# Patient Record
Sex: Male | Born: 1935 | Race: White | Hispanic: No | Marital: Married | Smoking: Former smoker
Health system: Southern US, Community
[De-identification: ages and names within clinical notes are randomized; demographics above are authoritative.]

## PROBLEM LIST (undated history)

## (undated) DIAGNOSIS — I714 Abdominal aortic aneurysm, without rupture, unspecified: Secondary | ICD-10-CM

## (undated) DIAGNOSIS — J189 Pneumonia, unspecified organism: Secondary | ICD-10-CM

## (undated) DIAGNOSIS — I2510 Atherosclerotic heart disease of native coronary artery without angina pectoris: Secondary | ICD-10-CM

## (undated) DIAGNOSIS — N189 Chronic kidney disease, unspecified: Secondary | ICD-10-CM

## (undated) DIAGNOSIS — C4490 Unspecified malignant neoplasm of skin, unspecified: Secondary | ICD-10-CM

## (undated) DIAGNOSIS — J449 Chronic obstructive pulmonary disease, unspecified: Secondary | ICD-10-CM

## (undated) DIAGNOSIS — R51 Headache: Secondary | ICD-10-CM

## (undated) DIAGNOSIS — IMO0001 Reserved for inherently not codable concepts without codable children: Secondary | ICD-10-CM

## (undated) DIAGNOSIS — K219 Gastro-esophageal reflux disease without esophagitis: Secondary | ICD-10-CM

## (undated) DIAGNOSIS — I10 Essential (primary) hypertension: Secondary | ICD-10-CM

## (undated) DIAGNOSIS — E7800 Pure hypercholesterolemia, unspecified: Secondary | ICD-10-CM

## (undated) DIAGNOSIS — E119 Type 2 diabetes mellitus without complications: Secondary | ICD-10-CM

## (undated) DIAGNOSIS — C3490 Malignant neoplasm of unspecified part of unspecified bronchus or lung: Secondary | ICD-10-CM

## (undated) DIAGNOSIS — R519 Headache, unspecified: Secondary | ICD-10-CM

## (undated) HISTORY — DX: Essential (primary) hypertension: I10

## (undated) HISTORY — DX: Malignant neoplasm of unspecified part of unspecified bronchus or lung: C34.90

## (undated) HISTORY — DX: Abdominal aortic aneurysm, without rupture: I71.4

## (undated) HISTORY — PX: TONSILLECTOMY: SUR1361

## (undated) HISTORY — DX: Pure hypercholesterolemia, unspecified: E78.00

## (undated) HISTORY — DX: Atherosclerotic heart disease of native coronary artery without angina pectoris: I25.10

## (undated) HISTORY — DX: Unspecified malignant neoplasm of skin, unspecified: C44.90

## (undated) HISTORY — DX: Abdominal aortic aneurysm, without rupture, unspecified: I71.40

## (undated) HISTORY — DX: Type 2 diabetes mellitus without complications: E11.9

---

## 1998-04-28 HISTORY — PX: ABDOMINAL AORTIC ANEURYSM REPAIR: SUR1152

## 1998-04-28 HISTORY — PX: CORONARY STENT PLACEMENT: SHX1402

## 2004-01-30 ENCOUNTER — Ambulatory Visit: Payer: Self-pay | Admitting: Specialist

## 2010-10-23 ENCOUNTER — Ambulatory Visit: Payer: Self-pay | Admitting: Internal Medicine

## 2010-10-25 ENCOUNTER — Ambulatory Visit: Payer: Self-pay | Admitting: Oncology

## 2010-10-27 ENCOUNTER — Ambulatory Visit: Payer: Self-pay | Admitting: Oncology

## 2010-10-31 ENCOUNTER — Ambulatory Visit: Payer: Self-pay | Admitting: Oncology

## 2010-11-04 ENCOUNTER — Ambulatory Visit: Payer: Self-pay | Admitting: Oncology

## 2010-11-05 ENCOUNTER — Ambulatory Visit: Payer: Self-pay | Admitting: Internal Medicine

## 2010-11-08 ENCOUNTER — Ambulatory Visit: Payer: Self-pay | Admitting: Oncology

## 2010-11-27 ENCOUNTER — Ambulatory Visit: Payer: Self-pay | Admitting: Oncology

## 2010-12-28 ENCOUNTER — Ambulatory Visit: Payer: Self-pay | Admitting: Oncology

## 2010-12-31 ENCOUNTER — Ambulatory Visit: Payer: Self-pay | Admitting: Vascular Surgery

## 2011-01-01 ENCOUNTER — Ambulatory Visit: Payer: Self-pay | Admitting: Oncology

## 2011-01-27 ENCOUNTER — Ambulatory Visit: Payer: Self-pay | Admitting: Oncology

## 2011-02-27 ENCOUNTER — Ambulatory Visit: Payer: Self-pay | Admitting: Oncology

## 2011-03-29 ENCOUNTER — Ambulatory Visit: Payer: Self-pay | Admitting: Oncology

## 2011-05-01 ENCOUNTER — Ambulatory Visit: Payer: Self-pay | Admitting: Oncology

## 2011-05-08 ENCOUNTER — Ambulatory Visit: Payer: Self-pay | Admitting: Oncology

## 2011-05-29 ENCOUNTER — Ambulatory Visit: Payer: Self-pay | Admitting: Oncology

## 2011-05-30 ENCOUNTER — Ambulatory Visit: Payer: Self-pay | Admitting: Oncology

## 2011-06-02 LAB — COMPREHENSIVE METABOLIC PANEL
Albumin: 3.7 g/dL (ref 3.4–5.0)
Co2: 32 mmol/L (ref 21–32)
Creatinine: 1.59 mg/dL — ABNORMAL HIGH (ref 0.60–1.30)
EGFR (African American): 55 — ABNORMAL LOW
Glucose: 108 mg/dL — ABNORMAL HIGH (ref 65–99)
Osmolality: 289 (ref 275–301)
SGOT(AST): 18 U/L (ref 15–37)
SGPT (ALT): 22 U/L
Total Protein: 7.5 g/dL (ref 6.4–8.2)

## 2011-06-02 LAB — CBC CANCER CENTER
Eosinophil #: 0.4 x10 3/mm (ref 0.0–0.7)
Eosinophil %: 4.2 %
HCT: 42.1 % (ref 40.0–52.0)
HGB: 13.8 g/dL (ref 13.0–18.0)
Lymphocyte #: 0.8 x10 3/mm — ABNORMAL LOW (ref 1.0–3.6)
Lymphocyte %: 9.6 %
MCV: 89 fL (ref 80–100)
Monocyte #: 0.6 x10 3/mm (ref 0.0–0.7)
Monocyte %: 7.6 %
RBC: 4.71 10*6/uL (ref 4.40–5.90)
WBC: 8.3 x10 3/mm (ref 3.8–10.6)

## 2011-06-27 ENCOUNTER — Ambulatory Visit: Payer: Self-pay | Admitting: Oncology

## 2011-07-30 ENCOUNTER — Ambulatory Visit: Payer: Self-pay | Admitting: Oncology

## 2011-08-27 ENCOUNTER — Ambulatory Visit: Payer: Self-pay | Admitting: Oncology

## 2011-09-27 ENCOUNTER — Ambulatory Visit: Payer: Self-pay | Admitting: Oncology

## 2011-09-30 LAB — COMPREHENSIVE METABOLIC PANEL
Albumin: 3.6 g/dL (ref 3.4–5.0)
Alkaline Phosphatase: 77 U/L (ref 50–136)
Anion Gap: 5 — ABNORMAL LOW (ref 7–16)
Bilirubin,Total: 0.4 mg/dL (ref 0.2–1.0)
Calcium, Total: 8.8 mg/dL (ref 8.5–10.1)
Chloride: 105 mmol/L (ref 98–107)
Creatinine: 1.67 mg/dL — ABNORMAL HIGH (ref 0.60–1.30)
EGFR (African American): 45 — ABNORMAL LOW
EGFR (Non-African Amer.): 39 — ABNORMAL LOW
Glucose: 140 mg/dL — ABNORMAL HIGH (ref 65–99)
Osmolality: 291 (ref 275–301)
SGOT(AST): 15 U/L (ref 15–37)
SGPT (ALT): 24 U/L
Total Protein: 7.1 g/dL (ref 6.4–8.2)

## 2011-09-30 LAB — CBC CANCER CENTER
Basophil %: 0.8 %
Eosinophil %: 3.7 %
HCT: 41.1 % (ref 40.0–52.0)
HGB: 12.9 g/dL — ABNORMAL LOW (ref 13.0–18.0)
MCHC: 31.4 g/dL — ABNORMAL LOW (ref 32.0–36.0)
MCV: 91 fL (ref 80–100)
Monocyte %: 9 %
Neutrophil #: 5.7 x10 3/mm (ref 1.4–6.5)
Neutrophil %: 76.8 %
RDW: 14.5 % (ref 11.5–14.5)
WBC: 7.5 x10 3/mm (ref 3.8–10.6)

## 2011-10-27 ENCOUNTER — Ambulatory Visit: Payer: Self-pay | Admitting: Oncology

## 2011-11-03 LAB — CBC CANCER CENTER
Basophil #: 0 x10 3/mm (ref 0.0–0.1)
Basophil %: 0.6 %
Eosinophil #: 0.3 x10 3/mm (ref 0.0–0.7)
Eosinophil %: 4.3 %
HCT: 42.6 % (ref 40.0–52.0)
HGB: 13.6 g/dL (ref 13.0–18.0)
Lymphocyte %: 9.8 %
MCH: 28.9 pg (ref 26.0–34.0)
MCHC: 31.8 g/dL — ABNORMAL LOW (ref 32.0–36.0)
Monocyte #: 0.6 x10 3/mm (ref 0.2–1.0)
Neutrophil %: 77.6 %
Platelet: 164 x10 3/mm (ref 150–440)
RBC: 4.7 10*6/uL (ref 4.40–5.90)
RDW: 14.5 % (ref 11.5–14.5)
WBC: 7.7 x10 3/mm (ref 3.8–10.6)

## 2011-11-03 LAB — COMPREHENSIVE METABOLIC PANEL
Alkaline Phosphatase: 90 U/L (ref 50–136)
Calcium, Total: 9.7 mg/dL (ref 8.5–10.1)
Chloride: 103 mmol/L (ref 98–107)
Co2: 30 mmol/L (ref 21–32)
Creatinine: 1.46 mg/dL — ABNORMAL HIGH (ref 0.60–1.30)
EGFR (African American): 53 — ABNORMAL LOW
EGFR (Non-African Amer.): 45 — ABNORMAL LOW
Glucose: 136 mg/dL — ABNORMAL HIGH (ref 65–99)
Osmolality: 288 (ref 275–301)
Potassium: 4.4 mmol/L (ref 3.5–5.1)
SGOT(AST): 28 U/L (ref 15–37)
Sodium: 141 mmol/L (ref 136–145)
Total Protein: 7.2 g/dL (ref 6.4–8.2)

## 2011-11-24 ENCOUNTER — Ambulatory Visit: Payer: Self-pay | Admitting: Oncology

## 2011-11-27 ENCOUNTER — Ambulatory Visit: Payer: Self-pay | Admitting: Oncology

## 2011-12-03 ENCOUNTER — Ambulatory Visit: Payer: Self-pay | Admitting: Internal Medicine

## 2011-12-08 ENCOUNTER — Ambulatory Visit: Payer: Self-pay | Admitting: Oncology

## 2011-12-28 ENCOUNTER — Ambulatory Visit: Payer: Self-pay | Admitting: Oncology

## 2012-01-05 LAB — CBC CANCER CENTER
Basophil #: 0.1 x10 3/mm (ref 0.0–0.1)
Basophil %: 0.6 %
Eosinophil #: 0.1 x10 3/mm (ref 0.0–0.7)
Eosinophil %: 0.6 %
MCH: 28.5 pg (ref 26.0–34.0)
MCHC: 31.1 g/dL — ABNORMAL LOW (ref 32.0–36.0)
Monocyte #: 0.4 x10 3/mm (ref 0.2–1.0)
Neutrophil #: 10.6 x10 3/mm — ABNORMAL HIGH (ref 1.4–6.5)
Neutrophil %: 90.7 %
Platelet: 133 x10 3/mm — ABNORMAL LOW (ref 150–440)
RBC: 4.54 10*6/uL (ref 4.40–5.90)
RDW: 15.5 % — ABNORMAL HIGH (ref 11.5–14.5)
WBC: 11.7 x10 3/mm — ABNORMAL HIGH (ref 3.8–10.6)

## 2012-01-05 LAB — COMPREHENSIVE METABOLIC PANEL
Albumin: 3.4 g/dL (ref 3.4–5.0)
Alkaline Phosphatase: 67 U/L (ref 50–136)
BUN: 38 mg/dL — ABNORMAL HIGH (ref 7–18)
Bilirubin,Total: 0.5 mg/dL (ref 0.2–1.0)
Chloride: 100 mmol/L (ref 98–107)
Co2: 31 mmol/L (ref 21–32)
Creatinine: 1.87 mg/dL — ABNORMAL HIGH (ref 0.60–1.30)
EGFR (African American): 39 — ABNORMAL LOW
EGFR (Non-African Amer.): 34 — ABNORMAL LOW
Osmolality: 289 (ref 275–301)
SGOT(AST): 16 U/L (ref 15–37)
SGPT (ALT): 41 U/L (ref 12–78)
Sodium: 138 mmol/L (ref 136–145)
Total Protein: 6.8 g/dL (ref 6.4–8.2)

## 2012-01-21 LAB — COMPREHENSIVE METABOLIC PANEL
Anion Gap: 9 (ref 7–16)
BUN: 29 mg/dL — ABNORMAL HIGH (ref 7–18)
Chloride: 100 mmol/L (ref 98–107)
Creatinine: 1.78 mg/dL — ABNORMAL HIGH (ref 0.60–1.30)
EGFR (African American): 41 — ABNORMAL LOW
EGFR (Non-African Amer.): 36 — ABNORMAL LOW
Glucose: 172 mg/dL — ABNORMAL HIGH (ref 65–99)
Osmolality: 282 (ref 275–301)
SGOT(AST): 29 U/L (ref 15–37)
SGPT (ALT): 41 U/L (ref 12–78)
Total Protein: 6.8 g/dL (ref 6.4–8.2)

## 2012-01-21 LAB — CBC CANCER CENTER
Basophil #: 0.1 x10 3/mm (ref 0.0–0.1)
Basophil %: 1.1 %
Eosinophil #: 0.1 x10 3/mm (ref 0.0–0.7)
Eosinophil %: 1.9 %
HCT: 35.2 % — ABNORMAL LOW (ref 40.0–52.0)
HGB: 11.1 g/dL — ABNORMAL LOW (ref 13.0–18.0)
Lymphocyte %: 6 %
MCHC: 31.4 g/dL — ABNORMAL LOW (ref 32.0–36.0)
MCV: 91 fL (ref 80–100)
Monocyte #: 0.9 x10 3/mm (ref 0.2–1.0)
Monocyte %: 11.9 %
Neutrophil #: 6 x10 3/mm (ref 1.4–6.5)
Neutrophil %: 79.1 %
RBC: 3.86 10*6/uL — ABNORMAL LOW (ref 4.40–5.90)

## 2012-01-23 LAB — CREATININE, SERUM
Creatinine: 1.82 mg/dL — ABNORMAL HIGH (ref 0.60–1.30)
EGFR (African American): 40 — ABNORMAL LOW
EGFR (Non-African Amer.): 35 — ABNORMAL LOW

## 2012-01-26 LAB — CBC CANCER CENTER
Basophil #: 0 x10 3/mm (ref 0.0–0.1)
Eosinophil #: 0.2 x10 3/mm (ref 0.0–0.7)
Eosinophil %: 3 %
HCT: 32 % — ABNORMAL LOW (ref 40.0–52.0)
MCH: 28.5 pg (ref 26.0–34.0)
MCV: 90 fL (ref 80–100)
Monocyte #: 0.7 x10 3/mm (ref 0.2–1.0)
Neutrophil #: 5.6 x10 3/mm (ref 1.4–6.5)
Neutrophil %: 80.6 %
Platelet: 253 x10 3/mm (ref 150–440)
RDW: 15.3 % — ABNORMAL HIGH (ref 11.5–14.5)

## 2012-01-26 LAB — COMPREHENSIVE METABOLIC PANEL
Albumin: 2.6 g/dL — ABNORMAL LOW (ref 3.4–5.0)
Alkaline Phosphatase: 55 U/L (ref 50–136)
BUN: 23 mg/dL — ABNORMAL HIGH (ref 7–18)
Bilirubin,Total: 0.4 mg/dL (ref 0.2–1.0)
Chloride: 99 mmol/L (ref 98–107)
Co2: 26 mmol/L (ref 21–32)
Creatinine: 1.56 mg/dL — ABNORMAL HIGH (ref 0.60–1.30)
EGFR (African American): 49 — ABNORMAL LOW
Osmolality: 284 (ref 275–301)
SGPT (ALT): 31 U/L (ref 12–78)
Sodium: 139 mmol/L (ref 136–145)
Total Protein: 7.1 g/dL (ref 6.4–8.2)

## 2012-01-27 ENCOUNTER — Ambulatory Visit: Payer: Self-pay | Admitting: Oncology

## 2012-02-02 ENCOUNTER — Ambulatory Visit: Payer: Self-pay | Admitting: Internal Medicine

## 2012-02-04 LAB — CBC CANCER CENTER
Basophil %: 0.5 %
Eosinophil #: 0.1 x10 3/mm (ref 0.0–0.7)
HCT: 35.9 % — ABNORMAL LOW (ref 40.0–52.0)
Lymphocyte #: 0.5 x10 3/mm — ABNORMAL LOW (ref 1.0–3.6)
Lymphocyte %: 2.6 %
Monocyte #: 0.4 x10 3/mm (ref 0.2–1.0)
Platelet: 229 x10 3/mm (ref 150–440)
RBC: 3.92 10*6/uL — ABNORMAL LOW (ref 4.40–5.90)
RDW: 15.8 % — ABNORMAL HIGH (ref 11.5–14.5)
WBC: 17.9 x10 3/mm — ABNORMAL HIGH (ref 3.8–10.6)

## 2012-02-16 LAB — CBC CANCER CENTER
Basophil #: 0 x10 3/mm (ref 0.0–0.1)
Basophil %: 0.3 %
Eosinophil #: 0.2 x10 3/mm (ref 0.0–0.7)
Lymphocyte %: 4.1 %
Monocyte %: 5.2 %
Neutrophil #: 9.6 x10 3/mm — ABNORMAL HIGH (ref 1.4–6.5)
Platelet: 110 x10 3/mm — ABNORMAL LOW (ref 150–440)
RDW: 17 % — ABNORMAL HIGH (ref 11.5–14.5)
WBC: 10.9 x10 3/mm — ABNORMAL HIGH (ref 3.8–10.6)

## 2012-02-16 LAB — COMPREHENSIVE METABOLIC PANEL
Albumin: 2.9 g/dL — ABNORMAL LOW (ref 3.4–5.0)
Alkaline Phosphatase: 66 U/L (ref 50–136)
Anion Gap: 12 (ref 7–16)
BUN: 27 mg/dL — ABNORMAL HIGH (ref 7–18)
Chloride: 100 mmol/L (ref 98–107)
Creatinine: 1.52 mg/dL — ABNORMAL HIGH (ref 0.60–1.30)
EGFR (Non-African Amer.): 43 — ABNORMAL LOW
Glucose: 181 mg/dL — ABNORMAL HIGH (ref 65–99)
Osmolality: 289 (ref 275–301)
Potassium: 3.7 mmol/L (ref 3.5–5.1)
SGOT(AST): 12 U/L — ABNORMAL LOW (ref 15–37)
Total Protein: 6.1 g/dL — ABNORMAL LOW (ref 6.4–8.2)

## 2012-02-23 LAB — CBC CANCER CENTER
Basophil #: 0.1 x10 3/mm (ref 0.0–0.1)
Basophil %: 1 %
Eosinophil #: 0.1 x10 3/mm (ref 0.0–0.7)
HCT: 33.9 % — ABNORMAL LOW (ref 40.0–52.0)
HGB: 10.5 g/dL — ABNORMAL LOW (ref 13.0–18.0)
Lymphocyte %: 4.4 %
MCH: 29.1 pg (ref 26.0–34.0)
MCHC: 31.1 g/dL — ABNORMAL LOW (ref 32.0–36.0)
Monocyte #: 0.2 x10 3/mm (ref 0.2–1.0)
Monocyte %: 4 %
Neutrophil #: 5.4 x10 3/mm (ref 1.4–6.5)
Platelet: 111 x10 3/mm — ABNORMAL LOW (ref 150–440)
RBC: 3.62 10*6/uL — ABNORMAL LOW (ref 4.40–5.90)
RDW: 17.4 % — ABNORMAL HIGH (ref 11.5–14.5)

## 2012-02-23 LAB — COMPREHENSIVE METABOLIC PANEL
Anion Gap: 11 (ref 7–16)
BUN: 32 mg/dL — ABNORMAL HIGH (ref 7–18)
Bilirubin,Total: 0.3 mg/dL (ref 0.2–1.0)
Calcium, Total: 8.9 mg/dL (ref 8.5–10.1)
Chloride: 107 mmol/L (ref 98–107)
Co2: 26 mmol/L (ref 21–32)
Creatinine: 1.32 mg/dL — ABNORMAL HIGH (ref 0.60–1.30)
EGFR (African American): 59 — ABNORMAL LOW
SGOT(AST): 13 U/L — ABNORMAL LOW (ref 15–37)
SGPT (ALT): 35 U/L (ref 12–78)

## 2012-02-27 ENCOUNTER — Ambulatory Visit: Payer: Self-pay | Admitting: Oncology

## 2012-03-01 LAB — COMPREHENSIVE METABOLIC PANEL
Albumin: 2.8 g/dL — ABNORMAL LOW (ref 3.4–5.0)
Alkaline Phosphatase: 68 U/L (ref 50–136)
BUN: 25 mg/dL — ABNORMAL HIGH (ref 7–18)
Calcium, Total: 8.6 mg/dL (ref 8.5–10.1)
EGFR (Non-African Amer.): 35 — ABNORMAL LOW
Glucose: 169 mg/dL — ABNORMAL HIGH (ref 65–99)
Osmolality: 284 (ref 275–301)
SGOT(AST): 20 U/L (ref 15–37)
SGPT (ALT): 49 U/L (ref 12–78)
Sodium: 138 mmol/L (ref 136–145)
Total Protein: 6.1 g/dL — ABNORMAL LOW (ref 6.4–8.2)

## 2012-03-01 LAB — CBC CANCER CENTER
Basophil #: 0 x10 3/mm (ref 0.0–0.1)
Basophil %: 1.1 %
Eosinophil #: 0 x10 3/mm (ref 0.0–0.7)
HGB: 10.7 g/dL — ABNORMAL LOW (ref 13.0–18.0)
Lymphocyte %: 5.8 %
MCHC: 31.9 g/dL — ABNORMAL LOW (ref 32.0–36.0)
Monocyte #: 0.3 x10 3/mm (ref 0.2–1.0)
Monocyte %: 9 %
Neutrophil %: 83 %
Platelet: 125 x10 3/mm — ABNORMAL LOW (ref 150–440)

## 2012-03-15 LAB — CBC CANCER CENTER
Basophil #: 0 x10 3/mm (ref 0.0–0.1)
Basophil %: 0.7 %
Eosinophil %: 1.6 %
HCT: 31.7 % — ABNORMAL LOW (ref 40.0–52.0)
Lymphocyte #: 0.3 x10 3/mm — ABNORMAL LOW (ref 1.0–3.6)
Lymphocyte %: 4.6 %
MCH: 29.2 pg (ref 26.0–34.0)
MCHC: 31.5 g/dL — ABNORMAL LOW (ref 32.0–36.0)
MCV: 93 fL (ref 80–100)
Monocyte #: 1.1 x10 3/mm — ABNORMAL HIGH (ref 0.2–1.0)
Monocyte %: 17.9 %
Platelet: 237 x10 3/mm (ref 150–440)
RBC: 3.43 10*6/uL — ABNORMAL LOW (ref 4.40–5.90)
RDW: 17.1 % — ABNORMAL HIGH (ref 11.5–14.5)
WBC: 6.2 x10 3/mm (ref 3.8–10.6)

## 2012-03-15 LAB — COMPREHENSIVE METABOLIC PANEL
Albumin: 2.7 g/dL — ABNORMAL LOW (ref 3.4–5.0)
Alkaline Phosphatase: 71 U/L (ref 50–136)
Anion Gap: 9 (ref 7–16)
BUN: 20 mg/dL — ABNORMAL HIGH (ref 7–18)
Bilirubin,Total: 0.6 mg/dL (ref 0.2–1.0)
EGFR (African American): 49 — ABNORMAL LOW
EGFR (Non-African Amer.): 42 — ABNORMAL LOW
Glucose: 214 mg/dL — ABNORMAL HIGH (ref 65–99)
Osmolality: 288 (ref 275–301)
SGOT(AST): 21 U/L (ref 15–37)
SGPT (ALT): 37 U/L (ref 12–78)
Sodium: 140 mmol/L (ref 136–145)

## 2012-03-28 ENCOUNTER — Ambulatory Visit: Payer: Self-pay | Admitting: Oncology

## 2012-04-12 LAB — COMPREHENSIVE METABOLIC PANEL
Anion Gap: 9 (ref 7–16)
BUN: 27 mg/dL — ABNORMAL HIGH (ref 7–18)
Creatinine: 1.55 mg/dL — ABNORMAL HIGH (ref 0.60–1.30)
EGFR (African American): 49 — ABNORMAL LOW
EGFR (Non-African Amer.): 42 — ABNORMAL LOW
Glucose: 201 mg/dL — ABNORMAL HIGH (ref 65–99)
SGOT(AST): 17 U/L (ref 15–37)
SGPT (ALT): 34 U/L (ref 12–78)
Sodium: 142 mmol/L (ref 136–145)
Total Protein: 6.1 g/dL — ABNORMAL LOW (ref 6.4–8.2)

## 2012-04-12 LAB — CBC CANCER CENTER
Basophil #: 0 x10 3/mm (ref 0.0–0.1)
Eosinophil %: 1.7 %
HCT: 35.2 % — ABNORMAL LOW (ref 40.0–52.0)
Lymphocyte #: 0.4 x10 3/mm — ABNORMAL LOW (ref 1.0–3.6)
MCH: 31.5 pg (ref 26.0–34.0)
MCV: 96 fL (ref 80–100)
Monocyte #: 0.5 x10 3/mm (ref 0.2–1.0)
Neutrophil #: 6.1 x10 3/mm (ref 1.4–6.5)
Neutrophil %: 85.1 %
Platelet: 111 x10 3/mm — ABNORMAL LOW (ref 150–440)
RBC: 3.67 10*6/uL — ABNORMAL LOW (ref 4.40–5.90)
WBC: 7.2 x10 3/mm (ref 3.8–10.6)

## 2012-04-28 ENCOUNTER — Ambulatory Visit: Payer: Self-pay | Admitting: Oncology

## 2012-05-24 ENCOUNTER — Ambulatory Visit: Payer: Self-pay | Admitting: Oncology

## 2012-05-29 ENCOUNTER — Ambulatory Visit: Payer: Self-pay | Admitting: Oncology

## 2012-06-15 ENCOUNTER — Encounter: Payer: Self-pay | Admitting: Oncology

## 2012-06-26 ENCOUNTER — Ambulatory Visit: Payer: Self-pay | Admitting: Oncology

## 2012-06-26 ENCOUNTER — Encounter: Payer: Self-pay | Admitting: Oncology

## 2012-07-22 LAB — CBC CANCER CENTER
Basophil #: 0.1 x10 3/mm (ref 0.0–0.1)
Basophil %: 1.2 %
HCT: 38.8 % — ABNORMAL LOW (ref 40.0–52.0)
Lymphocyte #: 0.4 x10 3/mm — ABNORMAL LOW (ref 1.0–3.6)
MCH: 28.4 pg (ref 26.0–34.0)
MCHC: 32.2 g/dL (ref 32.0–36.0)
MCV: 88 fL (ref 80–100)
RBC: 4.39 10*6/uL — ABNORMAL LOW (ref 4.40–5.90)

## 2012-07-22 LAB — COMPREHENSIVE METABOLIC PANEL
Albumin: 3.2 g/dL — ABNORMAL LOW (ref 3.4–5.0)
Alkaline Phosphatase: 89 U/L (ref 50–136)
BUN: 24 mg/dL — ABNORMAL HIGH (ref 7–18)
Chloride: 102 mmol/L (ref 98–107)
Creatinine: 1.73 mg/dL — ABNORMAL HIGH (ref 0.60–1.30)
EGFR (Non-African Amer.): 37 — ABNORMAL LOW
Glucose: 214 mg/dL — ABNORMAL HIGH (ref 65–99)
Potassium: 3.7 mmol/L (ref 3.5–5.1)
SGOT(AST): 14 U/L — ABNORMAL LOW (ref 15–37)
SGPT (ALT): 21 U/L (ref 12–78)
Total Protein: 6.6 g/dL (ref 6.4–8.2)

## 2012-07-27 ENCOUNTER — Ambulatory Visit: Payer: Self-pay | Admitting: Oncology

## 2012-07-27 ENCOUNTER — Encounter: Payer: Self-pay | Admitting: Oncology

## 2012-08-19 ENCOUNTER — Ambulatory Visit: Payer: Self-pay | Admitting: Oncology

## 2012-08-26 ENCOUNTER — Ambulatory Visit: Payer: Self-pay | Admitting: Oncology

## 2012-09-22 LAB — CBC CANCER CENTER
Basophil #: 0.1 x10 3/mm (ref 0.0–0.1)
Basophil %: 1.2 %
Eosinophil #: 0.3 x10 3/mm (ref 0.0–0.7)
Eosinophil %: 3.6 %
HCT: 36.1 % — ABNORMAL LOW (ref 40.0–52.0)
HGB: 11.6 g/dL — ABNORMAL LOW (ref 13.0–18.0)
Lymphocyte #: 0.4 x10 3/mm — ABNORMAL LOW (ref 1.0–3.6)
Lymphocyte %: 5.2 %
MCH: 27.8 pg (ref 26.0–34.0)
MCHC: 32.1 g/dL (ref 32.0–36.0)
Monocyte #: 0.6 x10 3/mm (ref 0.2–1.0)
Monocyte %: 8 %
Platelet: 193 x10 3/mm (ref 150–440)
RBC: 4.17 10*6/uL — ABNORMAL LOW (ref 4.40–5.90)
RDW: 15.3 % — ABNORMAL HIGH (ref 11.5–14.5)
WBC: 7.4 x10 3/mm (ref 3.8–10.6)

## 2012-09-22 LAB — COMPREHENSIVE METABOLIC PANEL
Albumin: 3 g/dL — ABNORMAL LOW (ref 3.4–5.0)
Alkaline Phosphatase: 83 U/L (ref 50–136)
BUN: 22 mg/dL — ABNORMAL HIGH (ref 7–18)
Bilirubin,Total: 0.4 mg/dL (ref 0.2–1.0)
EGFR (African American): 49 — ABNORMAL LOW
Osmolality: 287 (ref 275–301)
SGOT(AST): 14 U/L — ABNORMAL LOW (ref 15–37)
Total Protein: 6.3 g/dL — ABNORMAL LOW (ref 6.4–8.2)

## 2012-09-26 ENCOUNTER — Ambulatory Visit: Payer: Self-pay | Admitting: Oncology

## 2012-10-26 ENCOUNTER — Ambulatory Visit: Payer: Self-pay | Admitting: Oncology

## 2012-11-01 LAB — COMPREHENSIVE METABOLIC PANEL
Albumin: 3 g/dL — ABNORMAL LOW (ref 3.4–5.0)
Alkaline Phosphatase: 69 U/L (ref 50–136)
BUN: 23 mg/dL — ABNORMAL HIGH (ref 7–18)
Bilirubin,Total: 0.7 mg/dL (ref 0.2–1.0)
Calcium, Total: 9 mg/dL (ref 8.5–10.1)
Chloride: 105 mmol/L (ref 98–107)
Co2: 31 mmol/L (ref 21–32)
Creatinine: 1.84 mg/dL — ABNORMAL HIGH (ref 0.60–1.30)
EGFR (Non-African Amer.): 34 — ABNORMAL LOW
Glucose: 201 mg/dL — ABNORMAL HIGH (ref 65–99)
Osmolality: 294 (ref 275–301)
SGPT (ALT): 30 U/L (ref 12–78)
Sodium: 143 mmol/L (ref 136–145)
Total Protein: 6.4 g/dL (ref 6.4–8.2)

## 2012-11-01 LAB — CBC CANCER CENTER
Basophil #: 0.1 x10 3/mm (ref 0.0–0.1)
Basophil %: 1.1 %
Eosinophil #: 0.3 x10 3/mm (ref 0.0–0.7)
HGB: 12.3 g/dL — ABNORMAL LOW (ref 13.0–18.0)
Lymphocyte #: 0.5 x10 3/mm — ABNORMAL LOW (ref 1.0–3.6)
MCHC: 32.7 g/dL (ref 32.0–36.0)
MCV: 88 fL (ref 80–100)
Monocyte #: 0.7 x10 3/mm (ref 0.2–1.0)
Neutrophil #: 8 x10 3/mm — ABNORMAL HIGH (ref 1.4–6.5)
Neutrophil %: 83.3 %
Platelet: 169 x10 3/mm (ref 150–440)
RBC: 4.26 10*6/uL — ABNORMAL LOW (ref 4.40–5.90)
RDW: 16 % — ABNORMAL HIGH (ref 11.5–14.5)
WBC: 9.6 x10 3/mm (ref 3.8–10.6)

## 2012-11-22 LAB — CBC CANCER CENTER
Basophil #: 0.1 x10 3/mm (ref 0.0–0.1)
Basophil %: 0.5 %
Eosinophil %: 1.9 %
HGB: 13.2 g/dL (ref 13.0–18.0)
Lymphocyte %: 3.9 %
MCH: 29.1 pg (ref 26.0–34.0)
MCHC: 32.6 g/dL (ref 32.0–36.0)
Monocyte #: 0.8 x10 3/mm (ref 0.2–1.0)
Neutrophil %: 87.4 %
Platelet: 135 x10 3/mm — ABNORMAL LOW (ref 150–440)
RBC: 4.53 10*6/uL (ref 4.40–5.90)
RDW: 16.3 % — ABNORMAL HIGH (ref 11.5–14.5)
WBC: 12.6 x10 3/mm — ABNORMAL HIGH (ref 3.8–10.6)

## 2012-11-22 LAB — COMPREHENSIVE METABOLIC PANEL
Albumin: 3.1 g/dL — ABNORMAL LOW (ref 3.4–5.0)
BUN: 28 mg/dL — ABNORMAL HIGH (ref 7–18)
Bilirubin,Total: 1 mg/dL (ref 0.2–1.0)
Calcium, Total: 9 mg/dL (ref 8.5–10.1)
Co2: 32 mmol/L (ref 21–32)
Creatinine: 1.61 mg/dL — ABNORMAL HIGH (ref 0.60–1.30)
EGFR (African American): 46 — ABNORMAL LOW
Glucose: 128 mg/dL — ABNORMAL HIGH (ref 65–99)
Potassium: 4.2 mmol/L (ref 3.5–5.1)
SGOT(AST): 21 U/L (ref 15–37)
SGPT (ALT): 39 U/L (ref 12–78)
Sodium: 143 mmol/L (ref 136–145)
Total Protein: 6.2 g/dL — ABNORMAL LOW (ref 6.4–8.2)

## 2012-11-26 ENCOUNTER — Ambulatory Visit: Payer: Self-pay | Admitting: Oncology

## 2012-12-20 ENCOUNTER — Ambulatory Visit: Payer: Self-pay | Admitting: Oncology

## 2012-12-22 ENCOUNTER — Ambulatory Visit: Payer: Self-pay | Admitting: Oncology

## 2012-12-22 LAB — CBC CANCER CENTER
Basophil #: 0.1 x10 3/mm (ref 0.0–0.1)
Basophil %: 0.5 %
Eosinophil %: 0.9 %
HCT: 40.4 % (ref 40.0–52.0)
HGB: 13.2 g/dL (ref 13.0–18.0)
Lymphocyte #: 0.3 x10 3/mm — ABNORMAL LOW (ref 1.0–3.6)
MCHC: 32.8 g/dL (ref 32.0–36.0)
MCV: 90 fL (ref 80–100)
Monocyte #: 0.6 x10 3/mm (ref 0.2–1.0)
Monocyte %: 5.3 %
Platelet: 140 x10 3/mm — ABNORMAL LOW (ref 150–440)
RBC: 4.51 10*6/uL (ref 4.40–5.90)

## 2012-12-22 LAB — MAGNESIUM: Magnesium: 1.5 mg/dL — ABNORMAL LOW

## 2012-12-22 LAB — COMPREHENSIVE METABOLIC PANEL
Anion Gap: 5 — ABNORMAL LOW (ref 7–16)
BUN: 39 mg/dL — ABNORMAL HIGH (ref 7–18)
Bilirubin,Total: 1 mg/dL (ref 0.2–1.0)
Calcium, Total: 9.1 mg/dL (ref 8.5–10.1)
Chloride: 106 mmol/L (ref 98–107)
Creatinine: 1.81 mg/dL — ABNORMAL HIGH (ref 0.60–1.30)
Glucose: 167 mg/dL — ABNORMAL HIGH (ref 65–99)
Osmolality: 293 (ref 275–301)
Potassium: 3.7 mmol/L (ref 3.5–5.1)
SGOT(AST): 26 U/L (ref 15–37)
Sodium: 140 mmol/L (ref 136–145)

## 2012-12-27 ENCOUNTER — Ambulatory Visit: Payer: Self-pay | Admitting: Oncology

## 2013-01-19 LAB — COMPREHENSIVE METABOLIC PANEL
Albumin: 3.1 g/dL — ABNORMAL LOW (ref 3.4–5.0)
Alkaline Phosphatase: 65 U/L (ref 50–136)
Anion Gap: 7 (ref 7–16)
BUN: 28 mg/dL — ABNORMAL HIGH (ref 7–18)
Bilirubin,Total: 0.7 mg/dL (ref 0.2–1.0)
Calcium, Total: 9.1 mg/dL (ref 8.5–10.1)
Chloride: 107 mmol/L (ref 98–107)
Co2: 30 mmol/L (ref 21–32)
Creatinine: 1.55 mg/dL — ABNORMAL HIGH (ref 0.60–1.30)
EGFR (Non-African Amer.): 42 — ABNORMAL LOW
Glucose: 147 mg/dL — ABNORMAL HIGH (ref 65–99)
Osmolality: 295 (ref 275–301)
Potassium: 3.6 mmol/L (ref 3.5–5.1)
SGOT(AST): 18 U/L (ref 15–37)
SGPT (ALT): 33 U/L (ref 12–78)
Sodium: 144 mmol/L (ref 136–145)
Total Protein: 6.1 g/dL — ABNORMAL LOW (ref 6.4–8.2)

## 2013-01-19 LAB — CBC CANCER CENTER
Basophil #: 0.1 x10 3/mm (ref 0.0–0.1)
HGB: 13.5 g/dL (ref 13.0–18.0)
Lymphocyte #: 0.5 x10 3/mm — ABNORMAL LOW (ref 1.0–3.6)
Lymphocyte %: 3.7 %
MCH: 29.3 pg (ref 26.0–34.0)
MCHC: 31.8 g/dL — ABNORMAL LOW (ref 32.0–36.0)
Monocyte %: 7.7 %
Neutrophil %: 86.8 %
Platelet: 143 x10 3/mm — ABNORMAL LOW (ref 150–440)
WBC: 12.2 x10 3/mm — ABNORMAL HIGH (ref 3.8–10.6)

## 2013-01-25 DIAGNOSIS — C44621 Squamous cell carcinoma of skin of unspecified upper limb, including shoulder: Secondary | ICD-10-CM | POA: Insufficient documentation

## 2013-01-25 DIAGNOSIS — C44310 Basal cell carcinoma of skin of unspecified parts of face: Secondary | ICD-10-CM | POA: Insufficient documentation

## 2013-01-26 ENCOUNTER — Ambulatory Visit: Payer: Self-pay | Admitting: Oncology

## 2013-02-16 LAB — COMPREHENSIVE METABOLIC PANEL
Albumin: 3.1 g/dL — ABNORMAL LOW (ref 3.4–5.0)
Alkaline Phosphatase: 54 U/L (ref 50–136)
Anion Gap: 6 — ABNORMAL LOW (ref 7–16)
BUN: 37 mg/dL — ABNORMAL HIGH (ref 7–18)
Chloride: 108 mmol/L — ABNORMAL HIGH (ref 98–107)
Creatinine: 1.75 mg/dL — ABNORMAL HIGH (ref 0.60–1.30)
EGFR (Non-African Amer.): 36 — ABNORMAL LOW
Glucose: 127 mg/dL — ABNORMAL HIGH (ref 65–99)
Osmolality: 295 (ref 275–301)
Potassium: 3.9 mmol/L (ref 3.5–5.1)
SGOT(AST): 20 U/L (ref 15–37)
SGPT (ALT): 47 U/L (ref 12–78)

## 2013-02-16 LAB — CBC CANCER CENTER
Basophil #: 0 x10 3/mm (ref 0.0–0.1)
Eosinophil #: 0.2 x10 3/mm (ref 0.0–0.7)
Lymphocyte #: 0.5 x10 3/mm — ABNORMAL LOW (ref 1.0–3.6)
Lymphocyte %: 2.9 %
MCHC: 32 g/dL (ref 32.0–36.0)
MCV: 93 fL (ref 80–100)
Neutrophil #: 14.9 x10 3/mm — ABNORMAL HIGH (ref 1.4–6.5)
RBC: 4.85 10*6/uL (ref 4.40–5.90)
RDW: 15.1 % — ABNORMAL HIGH (ref 11.5–14.5)

## 2013-02-26 ENCOUNTER — Ambulatory Visit: Payer: Self-pay | Admitting: Oncology

## 2013-03-28 ENCOUNTER — Ambulatory Visit: Payer: Self-pay | Admitting: Oncology

## 2013-03-28 LAB — COMPREHENSIVE METABOLIC PANEL
Albumin: 3.2 g/dL — ABNORMAL LOW (ref 3.4–5.0)
BUN: 23 mg/dL — ABNORMAL HIGH (ref 7–18)
Bilirubin,Total: 0.8 mg/dL (ref 0.2–1.0)
Calcium, Total: 9.6 mg/dL (ref 8.5–10.1)
Chloride: 107 mmol/L (ref 98–107)
Co2: 33 mmol/L — ABNORMAL HIGH (ref 21–32)
Creatinine: 1.65 mg/dL — ABNORMAL HIGH (ref 0.60–1.30)
EGFR (Non-African Amer.): 39 — ABNORMAL LOW
Glucose: 161 mg/dL — ABNORMAL HIGH (ref 65–99)
Osmolality: 296 (ref 275–301)
Potassium: 3.7 mmol/L (ref 3.5–5.1)
SGOT(AST): 18 U/L (ref 15–37)
SGPT (ALT): 29 U/L (ref 12–78)
Sodium: 145 mmol/L (ref 136–145)

## 2013-03-28 LAB — CBC CANCER CENTER
Basophil #: 0 x10 3/mm (ref 0.0–0.1)
Eosinophil #: 0.2 x10 3/mm (ref 0.0–0.7)
HCT: 45 % (ref 40.0–52.0)
Lymphocyte #: 0.3 x10 3/mm — ABNORMAL LOW (ref 1.0–3.6)
MCH: 29.4 pg (ref 26.0–34.0)
Monocyte %: 7.2 %
Neutrophil %: 88.6 %
Platelet: 148 x10 3/mm — ABNORMAL LOW (ref 150–440)
RBC: 4.82 10*6/uL (ref 4.40–5.90)
RDW: 14.3 % (ref 11.5–14.5)
WBC: 12.8 x10 3/mm — ABNORMAL HIGH (ref 3.8–10.6)

## 2013-04-26 ENCOUNTER — Ambulatory Visit: Payer: Self-pay | Admitting: Oncology

## 2013-04-28 ENCOUNTER — Ambulatory Visit: Payer: Self-pay | Admitting: Oncology

## 2013-05-02 LAB — CBC CANCER CENTER
BASOS PCT: 0.1 %
Basophil #: 0 x10 3/mm (ref 0.0–0.1)
EOS PCT: 0.2 %
Eosinophil #: 0 x10 3/mm (ref 0.0–0.7)
HCT: 43.9 % (ref 40.0–52.0)
HGB: 13.5 g/dL (ref 13.0–18.0)
LYMPHS PCT: 1.9 %
Lymphocyte #: 0.3 x10 3/mm — ABNORMAL LOW (ref 1.0–3.6)
MCH: 28.5 pg (ref 26.0–34.0)
MCHC: 30.8 g/dL — AB (ref 32.0–36.0)
MCV: 93 fL (ref 80–100)
Monocyte #: 0.8 x10 3/mm (ref 0.2–1.0)
Monocyte %: 5.2 %
NEUTROS PCT: 92.6 %
Neutrophil #: 15.1 x10 3/mm — ABNORMAL HIGH (ref 1.4–6.5)
Platelet: 140 x10 3/mm — ABNORMAL LOW (ref 150–440)
RBC: 4.73 10*6/uL (ref 4.40–5.90)
RDW: 14.4 % (ref 11.5–14.5)
WBC: 16.3 x10 3/mm — ABNORMAL HIGH (ref 3.8–10.6)

## 2013-05-02 LAB — COMPREHENSIVE METABOLIC PANEL
ALK PHOS: 49 U/L
AST: 19 U/L (ref 15–37)
Albumin: 3.2 g/dL — ABNORMAL LOW (ref 3.4–5.0)
Anion Gap: 5 — ABNORMAL LOW (ref 7–16)
BILIRUBIN TOTAL: 0.8 mg/dL (ref 0.2–1.0)
BUN: 31 mg/dL — ABNORMAL HIGH (ref 7–18)
Calcium, Total: 9.5 mg/dL (ref 8.5–10.1)
Chloride: 107 mmol/L (ref 98–107)
Co2: 32 mmol/L (ref 21–32)
Creatinine: 1.82 mg/dL — ABNORMAL HIGH (ref 0.60–1.30)
EGFR (African American): 40 — ABNORMAL LOW
EGFR (Non-African Amer.): 34 — ABNORMAL LOW
GLUCOSE: 128 mg/dL — AB (ref 65–99)
Osmolality: 295 (ref 275–301)
POTASSIUM: 4.7 mmol/L (ref 3.5–5.1)
SGPT (ALT): 48 U/L (ref 12–78)
Sodium: 144 mmol/L (ref 136–145)
TOTAL PROTEIN: 6 g/dL — AB (ref 6.4–8.2)

## 2013-05-16 LAB — CBC CANCER CENTER
BASOS ABS: 0.1 x10 3/mm (ref 0.0–0.1)
Basophil %: 0.6 %
EOS ABS: 0.1 x10 3/mm (ref 0.0–0.7)
Eosinophil %: 0.8 %
HCT: 42.8 % (ref 40.0–52.0)
HGB: 13.4 g/dL (ref 13.0–18.0)
LYMPHS ABS: 0.3 x10 3/mm — AB (ref 1.0–3.6)
Lymphocyte %: 2.3 %
MCH: 29.1 pg (ref 26.0–34.0)
MCHC: 31.3 g/dL — AB (ref 32.0–36.0)
MCV: 93 fL (ref 80–100)
Monocyte #: 0.7 x10 3/mm (ref 0.2–1.0)
Monocyte %: 5.5 %
Neutrophil #: 11 x10 3/mm — ABNORMAL HIGH (ref 1.4–6.5)
Neutrophil %: 90.8 %
Platelet: 118 x10 3/mm — ABNORMAL LOW (ref 150–440)
RBC: 4.59 10*6/uL (ref 4.40–5.90)
RDW: 14.6 % — ABNORMAL HIGH (ref 11.5–14.5)
WBC: 12.1 x10 3/mm — ABNORMAL HIGH (ref 3.8–10.6)

## 2013-05-16 LAB — COMPREHENSIVE METABOLIC PANEL
Albumin: 3.1 g/dL — ABNORMAL LOW (ref 3.4–5.0)
Alkaline Phosphatase: 55 U/L
Anion Gap: 6 — ABNORMAL LOW (ref 7–16)
BILIRUBIN TOTAL: 0.7 mg/dL (ref 0.2–1.0)
BUN: 18 mg/dL (ref 7–18)
Calcium, Total: 8.6 mg/dL (ref 8.5–10.1)
Chloride: 104 mmol/L (ref 98–107)
Co2: 32 mmol/L (ref 21–32)
Creatinine: 1.43 mg/dL — ABNORMAL HIGH (ref 0.60–1.30)
EGFR (Non-African Amer.): 46 — ABNORMAL LOW
GFR CALC AF AMER: 53 — AB
GLUCOSE: 169 mg/dL — AB (ref 65–99)
Osmolality: 289 (ref 275–301)
Potassium: 3.9 mmol/L (ref 3.5–5.1)
SGOT(AST): 15 U/L (ref 15–37)
SGPT (ALT): 30 U/L (ref 12–78)
Sodium: 142 mmol/L (ref 136–145)
Total Protein: 6.4 g/dL (ref 6.4–8.2)

## 2013-05-16 LAB — MAGNESIUM: Magnesium: 2 mg/dL

## 2013-05-23 ENCOUNTER — Telehealth: Payer: Self-pay | Admitting: Internal Medicine

## 2013-05-23 NOTE — Telephone Encounter (Signed)
C/D 05/23/13 for appt. 05/25/13

## 2013-05-23 NOTE — Telephone Encounter (Signed)
[facility] @ [facility] A MESSAGE REQUESTING DEMOGRAPHIC INFORMATION TO BE FAXED OVER AND GAVE NP APPT 01/28 @ 1:30 W/DR. MOHAMED

## 2013-05-25 ENCOUNTER — Encounter: Payer: Self-pay | Admitting: Internal Medicine

## 2013-05-25 ENCOUNTER — Ambulatory Visit (HOSPITAL_BASED_OUTPATIENT_CLINIC_OR_DEPARTMENT_OTHER): Payer: Medicare Other | Admitting: Internal Medicine

## 2013-05-25 ENCOUNTER — Encounter: Payer: Self-pay | Admitting: *Deleted

## 2013-05-25 ENCOUNTER — Encounter (INDEPENDENT_AMBULATORY_CARE_PROVIDER_SITE_OTHER): Payer: Self-pay

## 2013-05-25 ENCOUNTER — Telehealth: Payer: Self-pay | Admitting: Internal Medicine

## 2013-05-25 ENCOUNTER — Ambulatory Visit (HOSPITAL_BASED_OUTPATIENT_CLINIC_OR_DEPARTMENT_OTHER): Payer: Medicare Other

## 2013-05-25 ENCOUNTER — Ambulatory Visit: Payer: Medicare Other

## 2013-05-25 ENCOUNTER — Other Ambulatory Visit: Payer: Self-pay

## 2013-05-25 VITALS — BP 123/70 | HR 97 | Temp 96.3°F | Resp 18 | Ht 69.5 in | Wt 194.2 lb

## 2013-05-25 DIAGNOSIS — I714 Abdominal aortic aneurysm, without rupture, unspecified: Secondary | ICD-10-CM

## 2013-05-25 DIAGNOSIS — C3430 Malignant neoplasm of lower lobe, unspecified bronchus or lung: Secondary | ICD-10-CM

## 2013-05-25 DIAGNOSIS — E7800 Pure hypercholesterolemia, unspecified: Secondary | ICD-10-CM

## 2013-05-25 DIAGNOSIS — C3490 Malignant neoplasm of unspecified part of unspecified bronchus or lung: Secondary | ICD-10-CM

## 2013-05-25 DIAGNOSIS — Z87891 Personal history of nicotine dependence: Secondary | ICD-10-CM

## 2013-05-25 DIAGNOSIS — E039 Hypothyroidism, unspecified: Secondary | ICD-10-CM

## 2013-05-25 DIAGNOSIS — I2510 Atherosclerotic heart disease of native coronary artery without angina pectoris: Secondary | ICD-10-CM

## 2013-05-25 DIAGNOSIS — E119 Type 2 diabetes mellitus without complications: Secondary | ICD-10-CM | POA: Insufficient documentation

## 2013-05-25 DIAGNOSIS — I10 Essential (primary) hypertension: Secondary | ICD-10-CM

## 2013-05-25 LAB — COMPREHENSIVE METABOLIC PANEL (CC13)
ALT: 24 U/L (ref 0–55)
ANION GAP: 14 meq/L — AB (ref 3–11)
AST: 15 U/L (ref 5–34)
Albumin: 3.2 g/dL — ABNORMAL LOW (ref 3.5–5.0)
Alkaline Phosphatase: 59 U/L (ref 40–150)
BUN: 24.1 mg/dL (ref 7.0–26.0)
CHLORIDE: 100 meq/L (ref 98–109)
CO2: 26 meq/L (ref 22–29)
Calcium: 9.8 mg/dL (ref 8.4–10.4)
Creatinine: 1.4 mg/dL — ABNORMAL HIGH (ref 0.7–1.3)
Glucose: 292 mg/dl — ABNORMAL HIGH (ref 70–140)
Potassium: 4.2 mEq/L (ref 3.5–5.1)
SODIUM: 140 meq/L (ref 136–145)
TOTAL PROTEIN: 6.4 g/dL (ref 6.4–8.3)
Total Bilirubin: 0.52 mg/dL (ref 0.20–1.20)

## 2013-05-25 LAB — CBC WITH DIFFERENTIAL/PLATELET
BASO%: 0.6 % (ref 0.0–2.0)
Basophils Absolute: 0.1 10*3/uL (ref 0.0–0.1)
EOS ABS: 0 10*3/uL (ref 0.0–0.5)
EOS%: 0.2 % (ref 0.0–7.0)
HCT: 41.5 % (ref 38.4–49.9)
HGB: 13 g/dL (ref 13.0–17.1)
LYMPH#: 0.2 10*3/uL — AB (ref 0.9–3.3)
LYMPH%: 2 % — AB (ref 14.0–49.0)
MCH: 29 pg (ref 27.2–33.4)
MCHC: 31.4 g/dL — AB (ref 32.0–36.0)
MCV: 92.4 fL (ref 79.3–98.0)
MONO#: 0.7 10*3/uL (ref 0.1–0.9)
MONO%: 6.5 % (ref 0.0–14.0)
NEUT%: 90.7 % — ABNORMAL HIGH (ref 39.0–75.0)
NEUTROS ABS: 10 10*3/uL — AB (ref 1.5–6.5)
PLATELETS: 183 10*3/uL (ref 140–400)
RBC: 4.49 10*6/uL (ref 4.20–5.82)
RDW: 14.2 % (ref 11.0–14.6)
WBC: 11 10*3/uL — AB (ref 4.0–10.3)

## 2013-05-25 LAB — MAGNESIUM (CC13): MAGNESIUM: 2 mg/dL (ref 1.5–2.5)

## 2013-05-25 LAB — PHOSPHORUS: Phosphorus: 3.1 mg/dL (ref 2.3–4.6)

## 2013-05-25 LAB — T3, FREE: T3 FREE: 2.6 pg/mL (ref 2.3–4.2)

## 2013-05-25 LAB — HEPATITIS C ANTIBODY: HCV Ab: NEGATIVE

## 2013-05-25 LAB — HEPATITIS B SURFACE ANTIGEN: Hepatitis B Surface Ag: NEGATIVE

## 2013-05-25 LAB — T4, FREE: Free T4: 1.3 ng/dL (ref 0.80–1.80)
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
## 2013-05-25 NOTE — Telephone Encounter (Signed)
sent pt to lab and gv pt barim

## 2013-05-25 NOTE — Progress Notes (Signed)
Checked in new pt with no financial concerns. °

## 2013-05-26 LAB — LACTATE DEHYDROGENASE (CC13): LDH: 209 U/L (ref 125–245)

## 2013-05-26 LAB — TSH CHCC: TSH: 0.566 m(IU)/L (ref 0.320–4.118)

## 2013-05-27 ENCOUNTER — Ambulatory Visit (HOSPITAL_COMMUNITY)
Admission: RE | Admit: 2013-05-27 | Discharge: 2013-05-27 | Disposition: A | Discharge status: Home / Self Care | Payer: Medicare Other | Source: Ambulatory Visit | Attending: Internal Medicine | Admitting: Internal Medicine

## 2013-05-29 ENCOUNTER — Ambulatory Visit: Payer: Self-pay | Admitting: Oncology

## 2013-06-07 ENCOUNTER — Ambulatory Visit (HOSPITAL_COMMUNITY)
Admission: RE | Admit: 2013-06-07 | Discharge: 2013-06-07 | Disposition: A | Discharge status: Home / Self Care | Payer: Medicare Other | Source: Ambulatory Visit | Attending: Internal Medicine | Admitting: Internal Medicine

## 2013-06-07 DIAGNOSIS — C3490 Malignant neoplasm of unspecified part of unspecified bronchus or lung: Secondary | ICD-10-CM | POA: Insufficient documentation

## 2013-06-07 DIAGNOSIS — N281 Cyst of kidney, acquired: Secondary | ICD-10-CM | POA: Insufficient documentation

## 2013-06-07 DIAGNOSIS — R222 Localized swelling, mass and lump, trunk: Secondary | ICD-10-CM | POA: Insufficient documentation

## 2013-06-07 DIAGNOSIS — K5730 Diverticulosis of large intestine without perforation or abscess without bleeding: Secondary | ICD-10-CM | POA: Insufficient documentation

## 2013-06-07 DIAGNOSIS — R918 Other nonspecific abnormal finding of lung field: Secondary | ICD-10-CM | POA: Insufficient documentation

## 2013-06-07 DIAGNOSIS — J90 Pleural effusion, not elsewhere classified: Secondary | ICD-10-CM | POA: Insufficient documentation

## 2013-06-07 MED ORDER — IOHEXOL 300 MG/ML  SOLN
100.0000 mL | Freq: Once | INTRAMUSCULAR | Status: AC | PRN
Start: 2013-06-07 — End: 2013-06-07
  Administered 2013-06-07: 100 mL via INTRAVENOUS

## 2013-06-16 ENCOUNTER — Other Ambulatory Visit (HOSPITAL_BASED_OUTPATIENT_CLINIC_OR_DEPARTMENT_OTHER): Payer: Medicare Other

## 2013-06-16 ENCOUNTER — Ambulatory Visit (HOSPITAL_BASED_OUTPATIENT_CLINIC_OR_DEPARTMENT_OTHER): Payer: Medicare Other

## 2013-06-16 ENCOUNTER — Ambulatory Visit (HOSPITAL_BASED_OUTPATIENT_CLINIC_OR_DEPARTMENT_OTHER): Payer: Medicare Other | Admitting: Internal Medicine

## 2013-06-16 ENCOUNTER — Encounter: Payer: Self-pay | Admitting: Internal Medicine

## 2013-06-16 ENCOUNTER — Encounter: Payer: Self-pay | Admitting: *Deleted

## 2013-06-16 VITALS — BP 125/73 | HR 96 | Temp 97.9°F | Resp 18 | Ht 69.0 in | Wt 199.3 lb

## 2013-06-16 DIAGNOSIS — C3430 Malignant neoplasm of lower lobe, unspecified bronchus or lung: Secondary | ICD-10-CM

## 2013-06-16 DIAGNOSIS — C3490 Malignant neoplasm of unspecified part of unspecified bronchus or lung: Secondary | ICD-10-CM

## 2013-06-16 DIAGNOSIS — Z5111 Encounter for antineoplastic chemotherapy: Secondary | ICD-10-CM

## 2013-06-16 DIAGNOSIS — I2510 Atherosclerotic heart disease of native coronary artery without angina pectoris: Secondary | ICD-10-CM

## 2013-06-16 DIAGNOSIS — Z006 Encounter for examination for normal comparison and control in clinical research program: Secondary | ICD-10-CM

## 2013-06-16 LAB — CBC WITH DIFFERENTIAL/PLATELET
BASO%: 0.2 % (ref 0.0–2.0)
BASOS ABS: 0 10*3/uL (ref 0.0–0.1)
EOS ABS: 0.1 10*3/uL (ref 0.0–0.5)
EOS%: 1.2 % (ref 0.0–7.0)
HCT: 40.3 % (ref 38.4–49.9)
HEMOGLOBIN: 12.1 g/dL — AB (ref 13.0–17.1)
LYMPH#: 0.3 10*3/uL — AB (ref 0.9–3.3)
LYMPH%: 3 % — ABNORMAL LOW (ref 14.0–49.0)
MCH: 28.2 pg (ref 27.2–33.4)
MCHC: 30 g/dL — ABNORMAL LOW (ref 32.0–36.0)
MCV: 93.9 fL (ref 79.3–98.0)
MONO#: 0.8 10*3/uL (ref 0.1–0.9)
MONO%: 7.4 % (ref 0.0–14.0)
NEUT%: 88.2 % — ABNORMAL HIGH (ref 39.0–75.0)
NEUTROS ABS: 8.9 10*3/uL — AB (ref 1.5–6.5)
NRBC: 0 % (ref 0–0)
Platelets: 224 10*3/uL (ref 140–400)
RBC: 4.29 10*6/uL (ref 4.20–5.82)
RDW: 13.4 % (ref 11.0–14.6)
WBC: 10.1 10*3/uL (ref 4.0–10.3)

## 2013-06-16 LAB — COMPREHENSIVE METABOLIC PANEL (CC13)
ALBUMIN: 3.1 g/dL — AB (ref 3.5–5.0)
ALK PHOS: 59 U/L (ref 40–150)
ALT: 21 U/L (ref 0–55)
AST: 16 U/L (ref 5–34)
Anion Gap: 11 mEq/L (ref 3–11)
BILIRUBIN TOTAL: 0.61 mg/dL (ref 0.20–1.20)
BUN: 14.9 mg/dL (ref 7.0–26.0)
CO2: 32 mEq/L — ABNORMAL HIGH (ref 22–29)
Calcium: 9.7 mg/dL (ref 8.4–10.4)
Chloride: 99 mEq/L (ref 98–109)
Creatinine: 1.4 mg/dL — ABNORMAL HIGH (ref 0.7–1.3)
Glucose: 166 mg/dl — ABNORMAL HIGH (ref 70–140)
Potassium: 3.9 mEq/L (ref 3.5–5.1)
SODIUM: 142 meq/L (ref 136–145)
TOTAL PROTEIN: 6.7 g/dL (ref 6.4–8.3)

## 2013-06-16 LAB — RESEARCH LABS

## 2013-06-16 LAB — MAGNESIUM (CC13): MAGNESIUM: 2.2 mg/dL (ref 1.5–2.5)

## 2013-06-16 LAB — LACTATE DEHYDROGENASE (CC13): LDH: 206 U/L (ref 125–245)

## 2013-06-16 LAB — PHOSPHORUS: Phosphorus: 3.1 mg/dL (ref 2.3–4.6)

## 2013-06-16 MED ORDER — SODIUM CHLORIDE 0.9 % IJ SOLN
10.0000 mL | INTRAMUSCULAR | Status: DC | PRN
  Administered 2013-06-16: 10 mL
  Filled 2013-06-16: qty 10

## 2013-06-16 MED ORDER — SODIUM CHLORIDE 0.9 % IV SOLN
3.0000 mg/kg | Freq: Once | INTRAVENOUS | Status: AC
  Administered 2013-06-16: 271 mg via INTRAVENOUS
  Filled 2013-06-16: qty 27.1

## 2013-06-16 MED ORDER — SODIUM CHLORIDE 0.9 % IV SOLN
Freq: Once | INTRAVENOUS | Status: AC
  Administered 2013-06-16: 13:00:00 via INTRAVENOUS

## 2013-06-16 MED ORDER — HEPARIN SOD (PORK) LOCK FLUSH 100 UNIT/ML IV SOLN
500.0000 [IU] | Freq: Once | INTRAVENOUS | Status: AC | PRN
  Administered 2013-06-16: 500 [IU]
  Filled 2013-06-16: qty 5

## 2013-06-16 NOTE — Progress Notes (Signed)
06/16/13 @ 12:20 pm, BMS [id number], Cycle 1:  [name] [name], accompanied by his wife and daughter, into the [facility] for day 1 labs, to see Dr. Julien Nordmann and to receive his first treatment with nivolumab.  Upon arrival I provided him with a patient identification card and explained that he should keep it in his wallet at all times. He completed the LCSS and [id number] PROs, then went to lab.  His labs were appropriate to proceed with treatment.  He had no changes on physical examination.  Dr. Julien Nordmann cautioned him about letting us know if he develops diarrhea, or changes in his breathing, or any other new signs and symptoms. Medication kit assignment made through [facility] and given to the pharmacy.  His wife brought his medication list as requested with start dates for all of his historical and ongoing medications.  He recently started Xanax for anxiety (06/09/13).   Spoke with Randolm Idol, RN in the infusion area about his treatment and provided her with a sign for the pump stating the duration of the infusion and the requirement for a 0.22 micron filter.    06/20/13 @ 8:45 am:  Spoke with patient's daughter, Orlena Sheldon by phone.  She reports that [name] was given aldactone by Dr. Oliva Bustard at [facility] at her request, not because his LE edema had worsened, but because it had not improved as quickly as she thought it should.  He did not start the aldactone until Saturday morning, June 18, 2013.  Advised her to please call us first for new or concerning symptoms and requests for new medications due to reporting requirements for the clinical trial.

## 2013-06-16 NOTE — Patient Instructions (Signed)
[facility] Discharge Instructions for Patients Receiving Chemotherapy  Today you received the following chemotherapy agents Nivolumab.   To help prevent nausea and vomiting after your treatment, we encourage you to take your nausea medication as prescribed.   If you develop nausea and vomiting that is not controlled by your nausea medication, call the clinic.   BELOW ARE SYMPTOMS THAT SHOULD BE REPORTED IMMEDIATELY:  *FEVER GREATER THAN 100.5 F  *CHILLS WITH OR WITHOUT FEVER  NAUSEA AND VOMITING THAT IS NOT CONTROLLED WITH YOUR NAUSEA MEDICATION  *UNUSUAL SHORTNESS OF BREATH  *UNUSUAL BRUISING OR BLEEDING  TENDERNESS IN MOUTH AND THROAT WITH OR WITHOUT PRESENCE OF ULCERS  *URINARY PROBLEMS  *BOWEL PROBLEMS  UNUSUAL RASH Items with * indicate a potential emergency and should be followed up as soon as possible.  Feel free to call the clinic should you have any questions or concerns. The clinic phone number is [phone].

## 2013-06-16 NOTE — Progress Notes (Signed)
[facility] [phone]   [phone]  OFFICE PROGRESS NOTE  [name], [name], MD [place] [address]  DIAGNOSIS: Metastatic non-small cell lung cancer, squamous cell carcinoma initially diagnosed as stage IIIa non-small cell lung cancer diagnosed in June of 2012.   PRIOR THERAPY: 1) status post a course of concurrent chemoradiation with weekly carboplatin and paclitaxel completed in September of 2012 under the care of Dr. Oliva Bustard. 2) the patient was followed by observation and in September of 2013 he had evidence for disease progression. 3) he was retreated again with a short course of concurrent chemoradiation with carboplatin and paclitaxel completed in November of 2013. 4) he was followed by observation until April 2014 when he was found to have evidence for disease progression. 5) He has a Soil scientist test good and the patient was started on treatment with Tarceva 150 mg by mouth daily for around 7 months discontinued on 05/02/2013 secondary to disease.   CURRENT THERAPY:immunotherapy according to the [facility] clinical trial [place] [id number] with Nivolumab 3.0  MG/KG every 2 weeks. First dose 06/16/2013.  INTERVAL HISTORY: [name] [name] 80 y.o. male returns to the clinic today for follow up visit accompanied by his wife and daughter. The patient was evaluated on 05/25/2013 for enrollment in the immunotherapy clinical trial with Nivolumab but he was not eligible at that time because of the persistent treatment with prednisone which was tapered over the last few weeks and he is currently off prednisone. He is feeling fine today except for the fatigue in addition to baseline shortness breath increased with exertion and cough. He denied having any fever or chills. The patient denied having any nausea or vomiting. He has no significant weight loss or night sweats.   MEDICAL HISTORY: Past Medical History  Diagnosis Date  . Lung cancer   .  Hypercholesteremia   . Hypertension   . Diabetes mellitus without complication   . AAA (abdominal aortic aneurysm)   . CAD (coronary artery disease)   . Skin cancer     ALLERGIES:  has No Known Allergies.  MEDICATIONS:  Current Outpatient Prescriptions  Medication Sig Dispense Refill  . ADVAIR DISKUS 250-50 MCG/DOSE AEPB Inhale 1 puff into the lungs. BID      . ALPRAZolam (XANAX) 0.25 MG tablet Take 1 tablet by mouth 2 (two) times daily.      . benzonatate (TESSALON) 200 MG capsule Take 200 mg by mouth 3 (three) times daily as needed for cough.      . citalopram (CELEXA) 20 MG tablet Take 20 mg by mouth daily.      . fluticasone (FLONASE) 50 MCG/ACT nasal spray 1 spray. 1 spray in each nostril BID      . furosemide (LASIX) 20 MG tablet Take 20 mg by mouth daily.      . Hydrocodone-Chlorpheniramine 5-4 MG/5ML SOLN Take 5 mLs by mouth 2 (two) times daily.      . insulin aspart (NOVOLOG) 100 UNIT/ML injection Inject into the skin as directed. Check before each meal, take as directed per sliding scale      . ipratropium-albuterol (DUONEB) 0.5-2.5 (3) MG/3ML SOLN Take 3 mLs by nebulization every 8 (eight) hours as needed.      Marland Kitchen LANTUS SOLOSTAR 100 UNIT/ML Solostar Pen 26 units at bedtime      . losartan (COZAAR) 50 MG tablet Take 50 mg by mouth daily.      . metoprolol succinate (TOPROL-XL) 100  MG 24 hr tablet Take 100 mg by mouth daily.      Marland Kitchen nystatin cream (MYCOSTATIN) 3 (three) times daily. Apply to affected area three times daily      . predniSONE (DELTASONE) 10 MG tablet Take 10 mg by mouth daily.      Marland Kitchen PROAIR HFA 108 (90 BASE) MCG/ACT inhaler Inhale 2 puffs into the lungs 4 (four) times daily as needed.      . simvastatin (ZOCOR) 20 MG tablet Take 20 mg by mouth daily.      Marland Kitchen SPIRIVA HANDIHALER 18 MCG inhalation capsule Place into inhaler and inhale daily.      Marland Kitchen tobramycin-dexamethasone (TOBRADEX) ophthalmic solution Place 2 drops into both eyes as needed. 2 drops in each eye BID        No current facility-administered medications for this visit.    SURGICAL HISTORY:  Past Surgical History  Procedure Laterality Date  . Abdominal aortic aneurysm repair  2000  . Coronary stent placement  2000  . Tonsillectomy      when he was a child    REVIEW OF SYSTEMS:  Constitutional: positive for fatigue Eyes: negative Ears, nose, mouth, throat, and face: negative Respiratory: positive for cough and dyspnea on exertion Cardiovascular: negative Gastrointestinal: negative Genitourinary:negative Integument/breast: negative Hematologic/lymphatic: negative Musculoskeletal:negative Neurological: negative Behavioral/Psych: negative Endocrine: negative Allergic/Immunologic: negative   PHYSICAL EXAMINATION: General appearance: alert, cooperative, fatigued and no distress Head: Normocephalic, without obvious abnormality, atraumatic Neck: no adenopathy, no JVD, supple, symmetrical, trachea midline and thyroid not enlarged, symmetric, no tenderness/mass/nodules Lymph nodes: Cervical, supraclavicular, and axillary nodes normal. Resp: wheezes RLL and RML Back: symmetric, no curvature. ROM normal. No CVA tenderness. Cardio: regular rate and rhythm, S1, S2 normal, no murmur, click, rub or gallop GI: soft, non-tender; bowel sounds normal; no masses,  no organomegaly Extremities: extremities normal, atraumatic, no cyanosis or edema Neurologic: Alert and oriented X 3, normal strength and tone. Normal symmetric reflexes. Normal coordination and gait  ECOG PERFORMANCE STATUS: 1 - Symptomatic but completely ambulatory  Blood pressure 125/73, pulse 96, temperature 97.9 F (36.6 C), temperature source Oral, resp. rate 18, height 5\' 9"  (1.753 m), weight 199 lb 4.8 oz (90.402 kg), SpO2 96.00%.  LABORATORY DATA: Lab Results  Component Value Date   WBC 10.1 06/16/2013   HGB 12.1* 06/16/2013   HCT 40.3 06/16/2013   MCV 93.9 06/16/2013   PLT 224 06/16/2013      Chemistry      Component  Value Date/Time   NA 142 06/16/2013 1104   K 3.9 06/16/2013 1104   CO2 32* 06/16/2013 1104   BUN 14.9 06/16/2013 1104   CREATININE 1.4* 06/16/2013 1104      Component Value Date/Time   CALCIUM 9.7 06/16/2013 1104   ALKPHOS 59 06/16/2013 1104   AST 16 06/16/2013 1104   ALT 21 06/16/2013 1104   BILITOT 0.61 06/16/2013 1104       RADIOGRAPHIC STUDIES: Ct Chest W Contrast  06/07/2013   CLINICAL DATA:  Lung cancer follow-up restaging exam. Recist protocol - baseline study.  EXAM: CT CHEST, ABDOMEN, AND PELVIS WITH CONTRAST  TECHNIQUE: Multidetector CT imaging of the chest, abdomen and pelvis was performed following the standard protocol during bolus administration of intravenous contrast.  CONTRAST:  159mL OMNIPAQUE IOHEXOL 300 MG/ML  SOLN  COMPARISON:  NM PET TUM IMG RESTAG (PS) SKULL BASE T - THIGH dated 04/26/2013  RECIST 1.1  Target Lesions:  1. 8.7 cm right lower lobe mass (image 35, series  2).  FINDINGS: CT CHEST FINDINGS  There is a large right lower lobe mass again demonstrated measuring 8.7 x 7.7 cm. Compared to the recent PET-CT scan mass is increased in volume. The mass at that time measured 5.8 x 7.8 cm (12/ 30/14). The mass extends into the right main pulmonary artery, constricts the right lower lobe bronchus, and constricts the superior right pulmonary vein.  There is a moderate right pleural effusion not changed from prior. No discrete mediastinal lymphadenopathy. Mass lesion does extend along the bronchus intermedius towards the carina.  Review of the lung parenchyma demonstrates several small peripheral left upper lobe pulmonary nodules. For instance 5 mm nodule in the periphery of left lung apex, image number 20, series 5. 3 mm nodule image 26. These nodules are similar to comparison exam.  CT ABDOMEN AND PELVIS FINDINGS  There is no focal hepatic lesion. Gallbladder, pancreas, spleen, adrenal glands are normal. There are multiple bilateral nonenhancing renal cysts. There is renal cortical  thinning.  Stomach, small bowel, and cecum are normal. The colon and rectosigmoid colon are normal. The diverticulum sigmoid colon.  Abdominal aorta demonstrates a graft repair. No pelvic periportal adenopathy. No retroperitoneal adenopathy.  No free fluid the pelvis. Prostate gland and bladder normal. No aggressive osseous lesion.  IMPRESSION: 1. Interval enlargement of the right lower lobe mass which invades the right hilum and pulmonary vasculature. 2. Stable small peripheral nodules in the left upper lobe. 3. No evidence metastasis in the abdomen or pelvis.   Electronically Signed   By: Suzy Bouchard M.D.   On: 06/07/2013 15:16   Ct Abdomen Pelvis W Contrast  06/07/2013   CLINICAL DATA:  Lung cancer follow-up restaging exam. Recist protocol - baseline study.  EXAM: CT CHEST, ABDOMEN, AND PELVIS WITH CONTRAST  TECHNIQUE: Multidetector CT imaging of the chest, abdomen and pelvis was performed following the standard protocol during bolus administration of intravenous contrast.  CONTRAST:  128mL OMNIPAQUE IOHEXOL 300 MG/ML  SOLN  COMPARISON:  NM PET TUM IMG RESTAG (PS) SKULL BASE T - THIGH dated 04/26/2013  RECIST 1.1  Target Lesions:  1. 8.7 cm right lower lobe mass (image 35, series 2).  FINDINGS: CT CHEST FINDINGS  There is a large right lower lobe mass again demonstrated measuring 8.7 x 7.7 cm. Compared to the recent PET-CT scan mass is increased in volume. The mass at that time measured 5.8 x 7.8 cm (12/ 30/14). The mass extends into the right main pulmonary artery, constricts the right lower lobe bronchus, and constricts the superior right pulmonary vein.  There is a moderate right pleural effusion not changed from prior. No discrete mediastinal lymphadenopathy. Mass lesion does extend along the bronchus intermedius towards the carina.  Review of the lung parenchyma demonstrates several small peripheral left upper lobe pulmonary nodules. For instance 5 mm nodule in the periphery of left lung apex, image  number 20, series 5. 3 mm nodule image 26. These nodules are similar to comparison exam.  CT ABDOMEN AND PELVIS FINDINGS  There is no focal hepatic lesion. Gallbladder, pancreas, spleen, adrenal glands are normal. There are multiple bilateral nonenhancing renal cysts. There is renal cortical thinning.  Stomach, small bowel, and cecum are normal. The colon and rectosigmoid colon are normal. The diverticulum sigmoid colon.  Abdominal aorta demonstrates a graft repair. No pelvic periportal adenopathy. No retroperitoneal adenopathy.  No free fluid the pelvis. Prostate gland and bladder normal. No aggressive osseous lesion.  IMPRESSION: 1. Interval enlargement of the right lower  lobe mass which invades the right hilum and pulmonary vasculature. 2. Stable small peripheral nodules in the left upper lobe. 3. No evidence metastasis in the abdomen or pelvis.   Electronically Signed   By: Suzy Bouchard M.D.   On: 06/07/2013 15:16    ASSESSMENT AND PLAN: This is a very pleasant 80 years old white male with metastatic non-small cell lung cancer, squama cell carcinoma that was initially diagnosed as stage IIIA. He is status post several chemotherapy regimens most recently with Tarceva but has evidence for disease progression. The patient meets the eligibility criteria for enrollment in the immunotherapy clinical trial with Nivolumab. His recent CT scan of the chest, abdomen and pelvis showed evidence for disease progression. I discussed the scan results and showed the images to the patient and his family. I recommended for him to proceed with treatment with Nivolumab today as scheduled. I reminded the patient of the expected adverse effect of this treatment including skin rash, diarrhea, endocrine dysfunction, liver or renal dysfunction. He would like to proceed with the treatment today as scheduled. He would come back for follow up visit in 2 weeks for reevaluation and management any adverse effect of his  treatment.  The patient voices understanding of current disease status and treatment options and is in agreement with the current care plan.  All questions were answered. The patient knows to call the clinic with any problems, questions or concerns. We can certainly see the patient much sooner if necessary.  I spent 20 minutes counseling the patient face to face. The total time spent in the appointment was 30 minutes.  Disclaimer: This note was dictated with voice recognition software. Similar sounding words can inadvertently be transcribed and may not be corrected upon review.

## 2013-06-17 ENCOUNTER — Telehealth: Payer: Self-pay | Admitting: *Deleted

## 2013-06-17 ENCOUNTER — Encounter: Payer: Self-pay | Admitting: *Deleted

## 2013-06-17 NOTE — Telephone Encounter (Signed)
Message copied by Cherylynn Ridges on Fri Jun 17, 2013  3:13 PM ------      Message from: Scott, Fordyce D      Created: Thu Jun 16, 2013  2:33 PM      Regarding: 1st time follow-up      Contact: [phone]       1st time Ardine Eng ([facility] [id number]).   ------

## 2013-06-17 NOTE — Telephone Encounter (Signed)
Called to ask if [name] [name] could begin taking the Aldactone prescribed by his oncologist in [place] for his "swelling" today.   I told her that this medicine was not contraindicated by the study. He will begin taking aldactone tonight.

## 2013-06-17 NOTE — Telephone Encounter (Signed)
Called [name] [name] for chemotherapy F/U.  Patient asleep and his wife [name] [name] spoke on his behalf.  Asked that she tell him of the call and to call back if questions or changes.  [name] [name] is doing well.  Denies n/v.  Does not have a good appetite anyway and no change with this.  Denies any new side effects or symptoms.  "Slight bowel problem anyway.  I warm prunes or he uses Miralax.  Don't know if bowels have moved today."  Bladder is functioning well.  Eating and drinking well and I instructed to drink 64 oz minimum daily or at least the day before, of and after treatment.  Denies questions at this time and encouraged to call if needed.  Reviewed how to call after hours in the case of an emergency.

## 2013-06-24 LAB — COMPREHENSIVE METABOLIC PANEL
ALK PHOS: 59 U/L
ANION GAP: 6 — AB (ref 7–16)
Albumin: 2.7 g/dL — ABNORMAL LOW (ref 3.4–5.0)
BUN: 14 mg/dL (ref 7–18)
Bilirubin,Total: 0.5 mg/dL (ref 0.2–1.0)
CALCIUM: 8.8 mg/dL (ref 8.5–10.1)
CREATININE: 1.6 mg/dL — AB (ref 0.60–1.30)
Chloride: 102 mmol/L (ref 98–107)
Co2: 35 mmol/L — ABNORMAL HIGH (ref 21–32)
EGFR (African American): 46 — ABNORMAL LOW
GFR CALC NON AF AMER: 40 — AB
GLUCOSE: 146 mg/dL — AB (ref 65–99)
OSMOLALITY: 288 (ref 275–301)
POTASSIUM: 4.2 mmol/L (ref 3.5–5.1)
SGOT(AST): 15 U/L (ref 15–37)
SGPT (ALT): 23 U/L (ref 12–78)
Sodium: 143 mmol/L (ref 136–145)
Total Protein: 6.4 g/dL (ref 6.4–8.2)

## 2013-06-24 LAB — CBC CANCER CENTER
BASOS ABS: 0.1 x10 3/mm (ref 0.0–0.1)
Basophil %: 0.6 %
Eosinophil #: 0.1 x10 3/mm (ref 0.0–0.7)
Eosinophil %: 1 %
HCT: 36.5 % — AB (ref 40.0–52.0)
HGB: 11.4 g/dL — ABNORMAL LOW (ref 13.0–18.0)
Lymphocyte #: 0.2 x10 3/mm — ABNORMAL LOW (ref 1.0–3.6)
Lymphocyte %: 2.1 %
MCH: 28.1 pg (ref 26.0–34.0)
MCHC: 31.1 g/dL — AB (ref 32.0–36.0)
MCV: 90 fL (ref 80–100)
Monocyte #: 0.9 x10 3/mm (ref 0.2–1.0)
Monocyte %: 8.5 %
Neutrophil #: 9.6 x10 3/mm — ABNORMAL HIGH (ref 1.4–6.5)
Neutrophil %: 87.8 %
Platelet: 245 x10 3/mm (ref 150–440)
RBC: 4.04 10*6/uL — ABNORMAL LOW (ref 4.40–5.90)
RDW: 13.9 % (ref 11.5–14.5)
WBC: 10.9 x10 3/mm — ABNORMAL HIGH (ref 3.8–10.6)

## 2013-06-26 ENCOUNTER — Ambulatory Visit: Payer: Self-pay | Admitting: Oncology

## 2013-06-30 ENCOUNTER — Encounter: Payer: Self-pay | Admitting: Physician Assistant

## 2013-06-30 ENCOUNTER — Ambulatory Visit (HOSPITAL_BASED_OUTPATIENT_CLINIC_OR_DEPARTMENT_OTHER): Payer: Medicare Other

## 2013-06-30 ENCOUNTER — Ambulatory Visit (HOSPITAL_BASED_OUTPATIENT_CLINIC_OR_DEPARTMENT_OTHER): Payer: Medicare Other | Admitting: Physician Assistant

## 2013-06-30 ENCOUNTER — Other Ambulatory Visit (HOSPITAL_BASED_OUTPATIENT_CLINIC_OR_DEPARTMENT_OTHER): Payer: Medicare Other

## 2013-06-30 ENCOUNTER — Encounter: Payer: Self-pay | Admitting: *Deleted

## 2013-06-30 ENCOUNTER — Other Ambulatory Visit: Payer: Self-pay | Admitting: Internal Medicine

## 2013-06-30 ENCOUNTER — Telehealth: Payer: Self-pay | Admitting: Internal Medicine

## 2013-06-30 VITALS — BP 100/80 | HR 90 | Temp 97.6°F | Resp 18 | Ht 69.0 in | Wt 186.9 lb

## 2013-06-30 DIAGNOSIS — C3430 Malignant neoplasm of lower lobe, unspecified bronchus or lung: Secondary | ICD-10-CM

## 2013-06-30 DIAGNOSIS — C3490 Malignant neoplasm of unspecified part of unspecified bronchus or lung: Secondary | ICD-10-CM

## 2013-06-30 DIAGNOSIS — B370 Candidal stomatitis: Secondary | ICD-10-CM

## 2013-06-30 DIAGNOSIS — Z5112 Encounter for antineoplastic immunotherapy: Secondary | ICD-10-CM

## 2013-06-30 LAB — CBC WITH DIFFERENTIAL/PLATELET
BASO%: 0.6 % (ref 0.0–2.0)
BASOS ABS: 0.1 10*3/uL (ref 0.0–0.1)
EOS ABS: 0.1 10*3/uL (ref 0.0–0.5)
EOS%: 0.9 % (ref 0.0–7.0)
HEMATOCRIT: 37.2 % — AB (ref 38.4–49.9)
HEMOGLOBIN: 11.4 g/dL — AB (ref 13.0–17.1)
LYMPH#: 0.3 10*3/uL — AB (ref 0.9–3.3)
LYMPH%: 2.3 % — AB (ref 14.0–49.0)
MCH: 27.2 pg (ref 27.2–33.4)
MCHC: 30.6 g/dL — ABNORMAL LOW (ref 32.0–36.0)
MCV: 88.7 fL (ref 79.3–98.0)
MONO#: 1 10*3/uL — AB (ref 0.1–0.9)
MONO%: 8.2 % (ref 0.0–14.0)
NEUT%: 88 % — AB (ref 39.0–75.0)
NEUTROS ABS: 10.8 10*3/uL — AB (ref 1.5–6.5)
PLATELETS: 255 10*3/uL (ref 140–400)
RBC: 4.19 10*6/uL — ABNORMAL LOW (ref 4.20–5.82)
RDW: 14.5 % (ref 11.0–14.6)
WBC: 12.3 10*3/uL — AB (ref 4.0–10.3)

## 2013-06-30 LAB — PHOSPHORUS: PHOSPHORUS: 3.1 mg/dL (ref 2.3–4.6)

## 2013-06-30 LAB — COMPREHENSIVE METABOLIC PANEL (CC13)
ALT: 16 U/L (ref 0–55)
ANION GAP: 10 meq/L (ref 3–11)
AST: 13 U/L (ref 5–34)
Albumin: 3.1 g/dL — ABNORMAL LOW (ref 3.5–5.0)
Alkaline Phosphatase: 63 U/L (ref 40–150)
BILIRUBIN TOTAL: 0.64 mg/dL (ref 0.20–1.20)
BUN: 21.3 mg/dL (ref 7.0–26.0)
CO2: 32 meq/L — AB (ref 22–29)
CREATININE: 1.6 mg/dL — AB (ref 0.7–1.3)
Calcium: 10 mg/dL (ref 8.4–10.4)
Chloride: 99 mEq/L (ref 98–109)
GLUCOSE: 126 mg/dL (ref 70–140)
Potassium: 4.1 mEq/L (ref 3.5–5.1)
Sodium: 142 mEq/L (ref 136–145)
Total Protein: 7 g/dL (ref 6.4–8.3)

## 2013-06-30 LAB — LACTATE DEHYDROGENASE (CC13): LDH: 177 U/L (ref 125–245)

## 2013-06-30 LAB — MAGNESIUM (CC13): MAGNESIUM: 2 mg/dL (ref 1.5–2.5)

## 2013-06-30 MED ORDER — SODIUM CHLORIDE 0.9 % IJ SOLN
10.0000 mL | INTRAMUSCULAR | Status: DC | PRN
  Administered 2013-06-30: 10 mL
  Filled 2013-06-30: qty 10

## 2013-06-30 MED ORDER — FLUCONAZOLE 100 MG PO TABS: ORAL_TABLET | ORAL | Status: DC

## 2013-06-30 MED ORDER — SODIUM CHLORIDE 0.9 % IV SOLN
3.0000 mg/kg | Freq: Once | INTRAVENOUS | Status: AC
  Administered 2013-06-30: 271 mg via INTRAVENOUS
  Filled 2013-06-30: qty 27.1

## 2013-06-30 MED ORDER — SODIUM CHLORIDE 0.9 % IV SOLN
Freq: Once | INTRAVENOUS | Status: AC
  Administered 2013-06-30: 13:00:00 via INTRAVENOUS

## 2013-06-30 MED ORDER — HEPARIN SOD (PORK) LOCK FLUSH 100 UNIT/ML IV SOLN
500.0000 [IU] | Freq: Once | INTRAVENOUS | Status: AC | PRN
  Administered 2013-06-30: 500 [IU]
  Filled 2013-06-30: qty 5

## 2013-06-30 NOTE — Progress Notes (Signed)
[name] [name]   [phone]  OFFICE PROGRESS NOTE  [name], [name], MD [place] [address]  DIAGNOSIS: Metastatic non-small cell lung cancer, squamous cell carcinoma initially diagnosed as stage IIIa non-small cell lung cancer diagnosed in June of 2012.   PRIOR THERAPY: 1) status post a course of concurrent chemoradiation with weekly carboplatin and paclitaxel completed in September of 2012 under the care of Dr. Oliva Bustard. 2) the patient was followed by observation and in September of 2013 he had evidence for disease progression. 3) he was retreated again with a short course of concurrent chemoradiation with carboplatin and paclitaxel completed in November of 2013. 4) he was followed by observation until April 2014 when he was found to have evidence for disease progression. 5) He has a Soil scientist test good and the patient was started on treatment with Tarceva 150 mg by mouth daily for around 7 months discontinued on 05/02/2013 secondary to disease.   CURRENT THERAPY: Immunotherapy according to the BMS clinical trial [place] [id number] with Nivolumab 3.0  MG/KG every 2 weeks. First dose 06/16/2013. Status post 1 cycle  INTERVAL HISTORY: Sun [name] [name] 80 y.o. male returns to the clinic today for follow up visit accompanied by his wife his daughter [name] [name] and his son [name] [name]. He tolerated his first cycle of immunotherapy with  Nivolumab possibly well. He reports that his shortness of breath is at baseline. He does note some increase shortness of breath with exertion particularly when climbing steps. He notes some constipation and is eating prunes with reasonable results. His by mouth intake is decreased overall. He reports that he is sleeping well. The rash related to his discontinue Tarceva therapy is resolving.  He denied having any fever or chills. The patient denied having any nausea or vomiting. He has no significant weight loss or  night sweats. He denied any skin rash or diarrhea.  MEDICAL HISTORY: Past Medical History  Diagnosis Date  . Lung cancer   . Hypercholesteremia   . Hypertension   . Diabetes mellitus without complication   . AAA (abdominal aortic aneurysm)   . CAD (coronary artery disease)   . Skin cancer     ALLERGIES:  has No Known Allergies.  MEDICATIONS:  Current Outpatient Prescriptions  Medication Sig Dispense Refill  . ADVAIR DISKUS 250-50 MCG/DOSE AEPB Inhale 1 puff into the lungs. BID      . ALPRAZolam (XANAX) 0.25 MG tablet Take 1 tablet by mouth 2 (two) times daily.      . benzonatate (TESSALON) 200 MG capsule Take 200 mg by mouth 3 (three) times daily as needed for cough.      . citalopram (CELEXA) 20 MG tablet Take 20 mg by mouth daily.      . fluticasone (FLONASE) 50 MCG/ACT nasal spray 1 spray. 1 spray in each nostril BID      . furosemide (LASIX) 20 MG tablet Take 20 mg by mouth daily.      . Hydrocodone-Chlorpheniramine 5-4 MG/5ML SOLN Take 5 mLs by mouth 2 (two) times daily.      . insulin aspart (NOVOLOG) 100 UNIT/ML injection Inject into the skin as directed. Check before each meal, take as directed per sliding scale      . ipratropium-albuterol (DUONEB) 0.5-2.5 (3) MG/3ML SOLN Take 3 mLs by nebulization every 8 (eight) hours as needed.      Marland Kitchen LANTUS SOLOSTAR 100 UNIT/ML Solostar Pen 26 units at bedtime      .  losartan (COZAAR) 50 MG tablet Take 50 mg by mouth daily.      . metoprolol succinate (TOPROL-XL) 100 MG 24 hr tablet Take 100 mg by mouth daily.      Marland Kitchen nystatin cream (MYCOSTATIN) 3 (three) times daily. Apply to affected area three times daily      . predniSONE (DELTASONE) 10 MG tablet Take 10 mg by mouth daily.      Marland Kitchen PROAIR HFA 108 (90 BASE) MCG/ACT inhaler Inhale 2 puffs into the lungs 4 (four) times daily as needed.      . simvastatin (ZOCOR) 20 MG tablet Take 20 mg by mouth daily.      Marland Kitchen SPIRIVA HANDIHALER 18 MCG inhalation capsule Place into inhaler and inhale  daily.      Marland Kitchen spironolactone (ALDACTONE) 50 MG tablet       . tobramycin-dexamethasone (TOBRADEX) ophthalmic solution Place 2 drops into both eyes as needed. 2 drops in each eye BID      . fluconazole (DIFLUCAN) 100 MG tablet Take 2 tablets by mouth on day one, then take 1 tablet by mouth daily until completed. Hold cholesterol medicine while on the fluconazole  16 tablet  0   No current facility-administered medications for this visit.   Facility-Administered Medications Ordered in Other Visits  Medication Dose Route Frequency Provider Last Rate Last Dose  . sodium chloride 0.9 % injection 10 mL  10 mL Intracatheter PRN Curt Bears, MD   10 mL at 06/30/13 1414    SURGICAL HISTORY:  Past Surgical History  Procedure Laterality Date  . Abdominal aortic aneurysm repair  2000  . Coronary stent placement  2000  . Tonsillectomy      when he was a child    REVIEW OF SYSTEMS:  Constitutional: positive for fatigue Eyes: negative Ears, nose, mouth, throat, and face: negative Respiratory: positive for dyspnea on exertion Cardiovascular: negative Gastrointestinal: positive for constipation Genitourinary:negative Integument/breast: negative Hematologic/lymphatic: negative Musculoskeletal:negative Neurological: negative Behavioral/Psych: negative Endocrine: negative Allergic/Immunologic: negative   PHYSICAL EXAMINATION: General appearance: alert, cooperative, fatigued and no distress Head: Normocephalic, without obvious abnormality, atraumatic Neck: no adenopathy, no JVD, supple, symmetrical, trachea midline and thyroid not enlarged, symmetric, no tenderness/mass/nodules Lymph nodes: Cervical, supraclavicular, and axillary nodes normal. Resp: wheezes RLL, RML and Faint Back: symmetric, no curvature. ROM normal. No CVA tenderness. Cardio: regular rate and rhythm, S1, S2 normal, no murmur, click, rub or gallop GI: soft, non-tender; bowel sounds normal; no masses,  no  organomegaly Extremities: extremities normal, atraumatic, no cyanosis or edema Neurologic: Alert and oriented X 3, normal strength and tone. Normal symmetric reflexes. Normal coordination and gait  ECOG PERFORMANCE STATUS: 1 - Symptomatic but completely ambulatory  Blood pressure 100/80, pulse 90, temperature 97.6 F (36.4 C), temperature source Oral, resp. rate 18, height 5\' 9"  (1.753 m), weight 186 lb 14.4 oz (84.777 kg), SpO2 98.00%.  LABORATORY DATA: Lab Results  Component Value Date   WBC 12.3* 06/30/2013   HGB 11.4* 06/30/2013   HCT 37.2* 06/30/2013   MCV 88.7 06/30/2013   PLT 255 06/30/2013      Chemistry      Component Value Date/Time   NA 142 06/30/2013 1127   K 4.1 06/30/2013 1127   CO2 32* 06/30/2013 1127   BUN 21.3 06/30/2013 1127   CREATININE 1.6* 06/30/2013 1127      Component Value Date/Time   CALCIUM 10.0 06/30/2013 1127   ALKPHOS 63 06/30/2013 1127   AST 13 06/30/2013 1127   ALT 16  06/30/2013 1127   BILITOT 0.64 06/30/2013 1127       RADIOGRAPHIC STUDIES: Ct Chest W Contrast  06/07/2013   CLINICAL DATA:  Lung cancer follow-up restaging exam. Recist protocol - baseline study.  EXAM: CT CHEST, ABDOMEN, AND PELVIS WITH CONTRAST  TECHNIQUE: Multidetector CT imaging of the chest, abdomen and pelvis was performed following the standard protocol during bolus administration of intravenous contrast.  CONTRAST:  178mL OMNIPAQUE IOHEXOL 300 MG/ML  SOLN  COMPARISON:  NM PET TUM IMG RESTAG (PS) SKULL BASE T - THIGH dated 04/26/2013  RECIST 1.1  Target Lesions:  1. 8.7 cm right lower lobe mass (image 35, series 2).  FINDINGS: CT CHEST FINDINGS  There is a large right lower lobe mass again demonstrated measuring 8.7 x 7.7 cm. Compared to the recent PET-CT scan mass is increased in volume. The mass at that time measured 5.8 x 7.8 cm (12/ 30/14). The mass extends into the right main pulmonary artery, constricts the right lower lobe bronchus, and constricts the superior right pulmonary vein.  There is a  moderate right pleural effusion not changed from prior. No discrete mediastinal lymphadenopathy. Mass lesion does extend along the bronchus intermedius towards the carina.  Review of the lung parenchyma demonstrates several small peripheral left upper lobe pulmonary nodules. For instance 5 mm nodule in the periphery of left lung apex, image number 20, series 5. 3 mm nodule image 26. These nodules are similar to comparison exam.  CT ABDOMEN AND PELVIS FINDINGS  There is no focal hepatic lesion. Gallbladder, pancreas, spleen, adrenal glands are normal. There are multiple bilateral nonenhancing renal cysts. There is renal cortical thinning.  Stomach, small bowel, and cecum are normal. The colon and rectosigmoid colon are normal. The diverticulum sigmoid colon.  Abdominal aorta demonstrates a graft repair. No pelvic periportal adenopathy. No retroperitoneal adenopathy.  No free fluid the pelvis. Prostate gland and bladder normal. No aggressive osseous lesion.  IMPRESSION: 1. Interval enlargement of the right lower lobe mass which invades the right hilum and pulmonary vasculature. 2. Stable small peripheral nodules in the left upper lobe. 3. No evidence metastasis in the abdomen or pelvis.   Electronically Signed   By: Suzy Bouchard M.D.   On: 06/07/2013 15:16   Ct Abdomen Pelvis W Contrast  06/07/2013   CLINICAL DATA:  Lung cancer follow-up restaging exam. Recist protocol - baseline study.  EXAM: CT CHEST, ABDOMEN, AND PELVIS WITH CONTRAST  TECHNIQUE: Multidetector CT imaging of the chest, abdomen and pelvis was performed following the standard protocol during bolus administration of intravenous contrast.  CONTRAST:  190mL OMNIPAQUE IOHEXOL 300 MG/ML  SOLN  COMPARISON:  NM PET TUM IMG RESTAG (PS) SKULL BASE T - THIGH dated 04/26/2013  RECIST 1.1  Target Lesions:  1. 8.7 cm right lower lobe mass (image 35, series 2).  FINDINGS: CT CHEST FINDINGS  There is a large right lower lobe mass again demonstrated measuring  8.7 x 7.7 cm. Compared to the recent PET-CT scan mass is increased in volume. The mass at that time measured 5.8 x 7.8 cm (12/ 30/14). The mass extends into the right main pulmonary artery, constricts the right lower lobe bronchus, and constricts the superior right pulmonary vein.  There is a moderate right pleural effusion not changed from prior. No discrete mediastinal lymphadenopathy. Mass lesion does extend along the bronchus intermedius towards the carina.  Review of the lung parenchyma demonstrates several small peripheral left upper lobe pulmonary nodules. For instance 5 mm nodule in  the periphery of left lung apex, image number 20, series 5. 3 mm nodule image 26. These nodules are similar to comparison exam.  CT ABDOMEN AND PELVIS FINDINGS  There is no focal hepatic lesion. Gallbladder, pancreas, spleen, adrenal glands are normal. There are multiple bilateral nonenhancing renal cysts. There is renal cortical thinning.  Stomach, small bowel, and cecum are normal. The colon and rectosigmoid colon are normal. The diverticulum sigmoid colon.  Abdominal aorta demonstrates a graft repair. No pelvic periportal adenopathy. No retroperitoneal adenopathy.  No free fluid the pelvis. Prostate gland and bladder normal. No aggressive osseous lesion.  IMPRESSION: 1. Interval enlargement of the right lower lobe mass which invades the right hilum and pulmonary vasculature. 2. Stable small peripheral nodules in the left upper lobe. 3. No evidence metastasis in the abdomen or pelvis.   Electronically Signed   By: Suzy Bouchard M.D.   On: 06/07/2013 15:16    ASSESSMENT AND PLAN: This is a very pleasant 80 years old white male with metastatic non-small cell lung cancer, squama cell carcinoma that was initially diagnosed as stage IIIA. He is status post several chemotherapy regimens most recently with Tarceva but has evidence for disease progression. The patient meets the eligibility criteria for enrollment in the  immunotherapy clinical trial with Nivolumab. He is now status post one cycle. His laboratory data has been reviewed and he may proceed with cycle #2 of his immunotherapy according to the BMS clinical trial with Nivolumab. His creatinine was slightly elevated at 1.6 the patient was encouraged to increase his by mouth intake of fluids. He'll followup in 2 weeks prior to her next scheduled cycle of immunotherapy. The patient voices understanding of current disease status and treatment options and is in agreement with the current care plan.  All questions were answered. The patient knows to call the clinic with any problems, questions or concerns. We can certainly see the patient much sooner if necessary.  Carlton Adam, PA-C   Disclaimer: This note was dictated with voice recognition software. Similar sounding words can inadvertently be transcribed and may not be corrected upon review.

## 2013-06-30 NOTE — Progress Notes (Signed)
06/30/13 @ 12:30 pm, BMS [id number], Cycle 2:  [name] [name], accompanied by several family members, into the [facility] for labs, to see [name] Metro, PA and to be treated with nivolumab.  All of his labs were appropriate to proceed with treatment, however his creatinine was increased over baseline (and above the upper limits of normal), therefore it will be repeated in one week.  He was advised to increase his oral fluid intake.  On physical examination he was found to have oral thrush and will be treated with a course of Diflucan (oral mucosal infection, grade 2). His appetite has been decreased over the last two weeks, (anorexia, grade 2).  [name] [name] reported constipation, mild, that is usually helped with eating prunes.  He said the prunes are not helping as much as usual, it was recommended that he utilize Miralax as needed to soften the stool.  This may be worsened by less than optimal fluid intake in the presence of diuretics.  Spoke with Randolm Idol, RN in the infusion room about his treatment today and provided her with a sign for the pump with duration of infusion and requirement of a 0.22 micron filter for infusion.

## 2013-06-30 NOTE — Telephone Encounter (Signed)
gv adn printed appt sched and avs for pt for March and April....sed added tx.

## 2013-07-04 ENCOUNTER — Telehealth: Payer: Self-pay | Admitting: *Deleted

## 2013-07-04 NOTE — Telephone Encounter (Signed)
07/04/13 @11 :05 am:  Mr. [name] [name] daughter, Orlena Sheldon, called regarding his father's use of Lasix.  He took his last dose of Lasix 40 mg today and has no refills on the prescription.  He has another 13 days worth of Aldactone on hand.  The question is whether he should continue taking the Lasix.  Per Lavella Lemons he is still not eating well.  She reports that he feels as though the food "gets caught in his throat" when he swallows.  He was advised to eat small frequent meals and add Glucerna.  He is doing both of those things.  The family wants to know if there is anything that could help that.  He is taking Diflucan for oral thrush, which was started on 06/30/13.

## 2013-07-04 NOTE — Patient Instructions (Signed)
Increase your by mouth intake of fluids Follow up in 2 weeks

## 2013-07-04 NOTE — Telephone Encounter (Signed)
07/04/13 11:25 am:  Spoke with Dr. Julien Nordmann about the continued use of Lasix.  He advised [name] [name] to hold both Lasix and aldactone for now.  When he comes in on Thursday March 12 for a creatinine, I will weigh him and check his lower extremities for edema.  If no significant increase in edema, will re-evaluate at the next office visit on 07/14/13.  As far as the swallowing issue, Dr. Julien Nordmann felt this was related to radiation therapy and advised that [name] [name] chew his food thoroughly and eat frequent small amounts.  There is no medication that would help this.  Called Mr. Huguley's daughter, Lavella [name], and relayed all of the above to her.  She is in agreement with the plan.

## 2013-07-07 ENCOUNTER — Encounter: Payer: Medicare Other | Admitting: *Deleted

## 2013-07-07 ENCOUNTER — Other Ambulatory Visit (HOSPITAL_BASED_OUTPATIENT_CLINIC_OR_DEPARTMENT_OTHER): Payer: Medicare Other

## 2013-07-07 DIAGNOSIS — C3430 Malignant neoplasm of lower lobe, unspecified bronchus or lung: Secondary | ICD-10-CM

## 2013-07-07 DIAGNOSIS — C3490 Malignant neoplasm of unspecified part of unspecified bronchus or lung: Secondary | ICD-10-CM

## 2013-07-07 LAB — BUN AND CREATININE (CC13)
BUN: 22.9 mg/dL (ref 7.0–26.0)
Creatinine: 1.7 mg/dL — ABNORMAL HIGH (ref 0.7–1.3)

## 2013-07-07 NOTE — Progress Notes (Signed)
07/07/13 @ 10:30 am, BMS [id number], Interim Labs and LE edema assessment:  Mr. [name] [name] into the [facility] to have a creatinine drawn and a weight due to elevation in his creatinine last week.  He weight was down another 3+ pounds, but he reports no appetite.  He says his food all tastes salty.  He continues the Diflucan as prescribe, but still has obvious thrush in the oral cavity.  He has no visible swelling in legs.  Will call him with results of the creatinine when available. Advised him to continue to hold the Lasix and the Aldactone as directed by Dr. Julien Nordmann.  Will reassess in one week.

## 2013-07-14 ENCOUNTER — Other Ambulatory Visit: Payer: Self-pay | Admitting: *Deleted

## 2013-07-14 ENCOUNTER — Ambulatory Visit (HOSPITAL_BASED_OUTPATIENT_CLINIC_OR_DEPARTMENT_OTHER): Payer: Medicare Other | Admitting: Internal Medicine

## 2013-07-14 ENCOUNTER — Ambulatory Visit (HOSPITAL_BASED_OUTPATIENT_CLINIC_OR_DEPARTMENT_OTHER): Payer: Medicare Other

## 2013-07-14 ENCOUNTER — Encounter: Payer: Self-pay | Admitting: *Deleted

## 2013-07-14 ENCOUNTER — Other Ambulatory Visit (HOSPITAL_BASED_OUTPATIENT_CLINIC_OR_DEPARTMENT_OTHER): Payer: Medicare Other

## 2013-07-14 ENCOUNTER — Encounter: Payer: Self-pay | Admitting: Internal Medicine

## 2013-07-14 VITALS — BP 94/51 | HR 72 | Temp 97.4°F | Resp 23 | Ht 69.0 in | Wt 182.6 lb

## 2013-07-14 DIAGNOSIS — B370 Candidal stomatitis: Secondary | ICD-10-CM

## 2013-07-14 DIAGNOSIS — R5381 Other malaise: Secondary | ICD-10-CM

## 2013-07-14 DIAGNOSIS — R5383 Other fatigue: Secondary | ICD-10-CM

## 2013-07-14 DIAGNOSIS — C3490 Malignant neoplasm of unspecified part of unspecified bronchus or lung: Secondary | ICD-10-CM

## 2013-07-14 DIAGNOSIS — C3430 Malignant neoplasm of lower lobe, unspecified bronchus or lung: Secondary | ICD-10-CM

## 2013-07-14 DIAGNOSIS — K5900 Constipation, unspecified: Secondary | ICD-10-CM

## 2013-07-14 DIAGNOSIS — R0989 Other specified symptoms and signs involving the circulatory and respiratory systems: Secondary | ICD-10-CM

## 2013-07-14 DIAGNOSIS — R0609 Other forms of dyspnea: Secondary | ICD-10-CM

## 2013-07-14 DIAGNOSIS — Z5112 Encounter for antineoplastic immunotherapy: Secondary | ICD-10-CM

## 2013-07-14 LAB — COMPREHENSIVE METABOLIC PANEL (CC13)
ALBUMIN: 3.1 g/dL — AB (ref 3.5–5.0)
ALT: 18 U/L (ref 0–55)
AST: 13 U/L (ref 5–34)
Alkaline Phosphatase: 73 U/L (ref 40–150)
Anion Gap: 11 mEq/L (ref 3–11)
BILIRUBIN TOTAL: 0.45 mg/dL (ref 0.20–1.20)
BUN: 23 mg/dL (ref 7.0–26.0)
CO2: 28 mEq/L (ref 22–29)
Calcium: 10.2 mg/dL (ref 8.4–10.4)
Chloride: 101 mEq/L (ref 98–109)
Creatinine: 1.5 mg/dL — ABNORMAL HIGH (ref 0.7–1.3)
GLUCOSE: 145 mg/dL — AB (ref 70–140)
Potassium: 4.6 mEq/L (ref 3.5–5.1)
SODIUM: 141 meq/L (ref 136–145)
TOTAL PROTEIN: 7.2 g/dL (ref 6.4–8.3)

## 2013-07-14 LAB — CBC WITH DIFFERENTIAL/PLATELET
BASO%: 0.7 % (ref 0.0–2.0)
BASOS ABS: 0.1 10*3/uL (ref 0.0–0.1)
EOS%: 2.2 % (ref 0.0–7.0)
Eosinophils Absolute: 0.2 10*3/uL (ref 0.0–0.5)
HCT: 37.3 % — ABNORMAL LOW (ref 38.4–49.9)
HEMOGLOBIN: 11.7 g/dL — AB (ref 13.0–17.1)
LYMPH%: 3.4 % — AB (ref 14.0–49.0)
MCH: 27.4 pg (ref 27.2–33.4)
MCHC: 31.4 g/dL — ABNORMAL LOW (ref 32.0–36.0)
MCV: 87.2 fL (ref 79.3–98.0)
MONO#: 0.8 10*3/uL (ref 0.1–0.9)
MONO%: 7.2 % (ref 0.0–14.0)
NEUT%: 86.5 % — ABNORMAL HIGH (ref 39.0–75.0)
NEUTROS ABS: 9.2 10*3/uL — AB (ref 1.5–6.5)
PLATELETS: 254 10*3/uL (ref 140–400)
RBC: 4.28 10*6/uL (ref 4.20–5.82)
RDW: 14.5 % (ref 11.0–14.6)
WBC: 10.6 10*3/uL — ABNORMAL HIGH (ref 4.0–10.3)
lymph#: 0.4 10*3/uL — ABNORMAL LOW (ref 0.9–3.3)

## 2013-07-14 LAB — PHOSPHORUS: PHOSPHORUS: 3.6 mg/dL (ref 2.3–4.6)

## 2013-07-14 LAB — RESEARCH LABS

## 2013-07-14 LAB — MAGNESIUM (CC13): MAGNESIUM: 2.2 mg/dL (ref 1.5–2.5)

## 2013-07-14 LAB — LACTATE DEHYDROGENASE (CC13): LDH: 150 U/L (ref 125–245)

## 2013-07-14 MED ORDER — HEPARIN SOD (PORK) LOCK FLUSH 100 UNIT/ML IV SOLN
500.0000 [IU] | Freq: Once | INTRAVENOUS | Status: AC | PRN
  Administered 2013-07-14: 500 [IU]
  Filled 2013-07-14: qty 5

## 2013-07-14 MED ORDER — SODIUM CHLORIDE 0.9 % IV SOLN
3.0000 mg/kg | Freq: Once | INTRAVENOUS | Status: AC
  Administered 2013-07-14: 271 mg via INTRAVENOUS
  Filled 2013-07-14: qty 27.1

## 2013-07-14 MED ORDER — FLUCONAZOLE 100 MG PO TABS: ORAL_TABLET | ORAL | Status: DC

## 2013-07-14 MED ORDER — SODIUM CHLORIDE 0.9 % IJ SOLN
10.0000 mL | INTRAMUSCULAR | Status: DC | PRN
  Administered 2013-07-14: 10 mL
  Filled 2013-07-14: qty 10

## 2013-07-14 MED ORDER — SODIUM CHLORIDE 0.9 % IV SOLN
Freq: Once | INTRAVENOUS | Status: AC
  Administered 2013-07-14: 10:00:00 via INTRAVENOUS

## 2013-07-14 NOTE — Patient Instructions (Signed)
[facility] Discharge Instructions for Patients Receiving Chemotherapy  Today you received the following chemotherapy agents nivolumab   To help prevent nausea and vomiting after your treatment, we encourage you to take your nausea medication as directed   If you develop nausea and vomiting that is not controlled by your nausea medication, call the clinic.   BELOW ARE SYMPTOMS THAT SHOULD BE REPORTED IMMEDIATELY:  *FEVER GREATER THAN 100.5 F  *CHILLS WITH OR WITHOUT FEVER  NAUSEA AND VOMITING THAT IS NOT CONTROLLED WITH YOUR NAUSEA MEDICATION  *UNUSUAL SHORTNESS OF BREATH  *UNUSUAL BRUISING OR BLEEDING  TENDERNESS IN MOUTH AND THROAT WITH OR WITHOUT PRESENCE OF ULCERS  *URINARY PROBLEMS  *BOWEL PROBLEMS  UNUSUAL RASH Items with * indicate a potential emergency and should be followed up as soon as possible.  Feel free to call the clinic you have any questions or concerns. The clinic phone number is [phone].  

## 2013-07-14 NOTE — Progress Notes (Signed)
[facility] [phone]   Fax:([phone]  OFFICE PROGRESS NOTE  [name], [name], MD [place] [address]  DIAGNOSIS: Metastatic non-small cell lung cancer, squamous cell carcinoma initially diagnosed as stage IIIa non-small cell lung cancer diagnosed in June of 2012.   PRIOR THERAPY: 1) status post a course of concurrent chemoradiation with weekly carboplatin and paclitaxel completed in September of 2012 under the care of Dr. Oliva Bustard. 2) the patient was followed by observation and in September of 2013 he had evidence for disease progression. 3) he was retreated again with a short course of concurrent chemoradiation with carboplatin and paclitaxel completed in November of 2013. 4) he was followed by observation until April 2014 when he was found to have evidence for disease progression. 5) He has a Soil scientist test good and the patient was started on treatment with Tarceva 150 mg by mouth daily for around 7 months discontinued on 05/02/2013 secondary to disease.   CURRENT THERAPY:immunotherapy according to the BMS clinical trial [place] [id number] with Nivolumab 3.0  MG/KG every 2 weeks, status post 2 cycles. First dose 06/16/2013.  INTERVAL HISTORY: [name] [name] 80 y.o. male returns to the clinic today for follow up visit accompanied by his wife and daughter.  He is feeling fine today except for the fatigue in addition to baseline shortness breath increased with exertion.  His cough has significantly improved. The patient is currently off diuretics with no significant swelling of his lower extremities or worsening dyspnea. He denied having any fever or chills. The patient denied having any nausea or vomiting, but has constipation last bowel movement was 2 days ago. He is trying dietary laxative. He has no significant weight loss or night sweats. He is here today to start cycle #3 of his immunotherapy.  MEDICAL HISTORY: Past Medical History   Diagnosis Date  . Lung cancer   . Hypercholesteremia   . Hypertension   . Diabetes mellitus without complication   . AAA (abdominal aortic aneurysm)   . CAD (coronary artery disease)   . Skin cancer     ALLERGIES:  has No Known Allergies.  MEDICATIONS:  Current Outpatient Prescriptions  Medication Sig Dispense Refill  . ADVAIR DISKUS 250-50 MCG/DOSE AEPB Inhale 1 puff into the lungs. BID      . ALPRAZolam (XANAX) 0.25 MG tablet Take 1 tablet by mouth 2 (two) times daily.      . benzonatate (TESSALON) 200 MG capsule Take 200 mg by mouth 3 (three) times daily as needed for cough.      . citalopram (CELEXA) 20 MG tablet Take 20 mg by mouth daily.      . fluconazole (DIFLUCAN) 100 MG tablet Take 2 tablets by mouth on day one, then take 1 tablet by mouth daily until completed. Hold cholesterol medicine while on the fluconazole  16 tablet  0  . fluticasone (FLONASE) 50 MCG/ACT nasal spray 1 spray. 1 spray in each nostril BID      . furosemide (LASIX) 20 MG tablet Take 20 mg by mouth daily.      . Hydrocodone-Chlorpheniramine 5-4 MG/5ML SOLN Take 5 mLs by mouth 2 (two) times daily.      . insulin aspart (NOVOLOG) 100 UNIT/ML injection Inject into the skin as directed. Check before each meal, take as directed per sliding scale      . ipratropium-albuterol (DUONEB) 0.5-2.5 (3) MG/3ML SOLN Take 3 mLs by nebulization every 8 (eight) hours as needed.      Marland Kitchen  LANTUS SOLOSTAR 100 UNIT/ML Solostar Pen 26 units at bedtime      . losartan (COZAAR) 50 MG tablet Take 50 mg by mouth daily.      . metoprolol succinate (TOPROL-XL) 100 MG 24 hr tablet Take 100 mg by mouth daily.      Marland Kitchen nystatin cream (MYCOSTATIN) 3 (three) times daily. Apply to affected area three times daily      . predniSONE (DELTASONE) 10 MG tablet Take 10 mg by mouth daily.      Marland Kitchen PROAIR HFA 108 (90 BASE) MCG/ACT inhaler Inhale 2 puffs into the lungs 4 (four) times daily as needed.      . simvastatin (ZOCOR) 20 MG tablet Take 20 mg by  mouth daily.      Marland Kitchen SPIRIVA HANDIHALER 18 MCG inhalation capsule Place into inhaler and inhale daily.      Marland Kitchen spironolactone (ALDACTONE) 50 MG tablet       . tobramycin-dexamethasone (TOBRADEX) ophthalmic solution Place 2 drops into both eyes as needed. 2 drops in each eye BID       No current facility-administered medications for this visit.    SURGICAL HISTORY:  Past Surgical History  Procedure Laterality Date  . Abdominal aortic aneurysm repair  2000  . Coronary stent placement  2000  . Tonsillectomy      when he was a child    REVIEW OF SYSTEMS:  Constitutional: positive for fatigue Eyes: negative Ears, nose, mouth, throat, and face: negative Respiratory: positive for cough and dyspnea on exertion Cardiovascular: negative Gastrointestinal: negative Genitourinary:negative Integument/breast: negative Hematologic/lymphatic: negative Musculoskeletal:negative Neurological: negative Behavioral/Psych: negative Endocrine: negative Allergic/Immunologic: negative   PHYSICAL EXAMINATION: General appearance: alert, cooperative, fatigued and no distress Head: Normocephalic, without obvious abnormality, atraumatic Neck: no adenopathy, no JVD, supple, symmetrical, trachea midline and thyroid not enlarged, symmetric, no tenderness/mass/nodules Lymph nodes: Cervical, supraclavicular, and axillary nodes normal. Resp: wheezes RLL and RML Back: symmetric, no curvature. ROM normal. No CVA tenderness. Cardio: regular rate and rhythm, S1, S2 normal, no murmur, click, rub or gallop GI: soft, non-tender; bowel sounds normal; no masses,  no organomegaly Extremities: extremities normal, atraumatic, no cyanosis or edema Neurologic: Alert and oriented X 3, normal strength and tone. Normal symmetric reflexes. Normal coordination and gait  ECOG PERFORMANCE STATUS: 1 - Symptomatic but completely ambulatory  Blood pressure 94/51, pulse 72, temperature 97.4 F (36.3 C), temperature source Oral, resp.  rate 23, height 5\' 9"  (1.753 m), weight 182 lb 9.6 oz (82.827 kg), SpO2 99.00%.  LABORATORY DATA: Lab Results  Component Value Date   WBC 10.6* 07/14/2013   HGB 11.7* 07/14/2013   HCT 37.3* 07/14/2013   MCV 87.2 07/14/2013   PLT 254 07/14/2013      Chemistry      Component Value Date/Time   NA 142 06/30/2013 1127   K 4.1 06/30/2013 1127   CO2 32* 06/30/2013 1127   BUN 22.9 07/07/2013 1046   CREATININE 1.7* 07/07/2013 1046      Component Value Date/Time   CALCIUM 10.0 06/30/2013 1127   ALKPHOS 63 06/30/2013 1127   AST 13 06/30/2013 1127   ALT 16 06/30/2013 1127   BILITOT 0.64 06/30/2013 1127       RADIOGRAPHIC STUDIES:  ASSESSMENT AND PLAN: This is a very pleasant 80 years old white male with metastatic non-small cell lung cancer, squama cell carcinoma that was initially diagnosed as stage IIIA. He is status post several chemotherapy regimens most recently with Tarceva but has evidence for disease progression.  He is currently undergoing immunotherapy with Nivolumab and tolerating his treatment fairly well status post 2 cycles. I recommended for the patient to proceed with cycle #3 today as scheduled. Forr constipation he was advised to try MiraLAX. He would come back for follow up visit in 2 weeks for reevaluation and management any adverse effect of his treatment.  The patient voices understanding of current disease status and treatment options and is in agreement with the current care plan.  All questions were answered. The patient knows to call the clinic with any problems, questions or concerns. We can certainly see the patient much sooner if necessary.  Disclaimer: This note was dictated with voice recognition software. Similar sounding words can inadvertently be transcribed and may not be corrected upon review.

## 2013-07-14 NOTE — Progress Notes (Signed)
07/14/13 @ 10:15 am, BMS [id number], Cycle 3:  [name] [name], accompanied by his wife and daughter, into the [facility] for labs, to see Dr. Julien Nordmann, and to receive treatment with nivolumab. His labs are all appropriate to proceed with treatment.  His appetite is still not good, but maybe slightly better than two weeks ago.  He continues to have evidence of oral thrush, so Dr. Julien Nordmann will give him another Rx for diflucan.  He remains off diuretics at this time and had some improvement in his creatinine, but it is still a little above his baseline; therefore, he will have creatinine checked in one week (07/20/13).  Encouraged him to increase his fluids orally, and he agreed to do so.  Spoke with Sabino Gasser, RN in the infusion room about treatment today and provided her with a sign for the pump detailing the duration of the infusion and the requirement of using a 0.22 micron filter for the nivolumab.  07/20/13 @11 :00 am:  [name] [name] into the [facility] for a serum creatinine per protocol.  The value remains at 1.5 mg/dL, which is the same as last week.  Called his wife with results.

## 2013-07-15 ENCOUNTER — Telehealth: Payer: Self-pay | Admitting: Internal Medicine

## 2013-07-15 ENCOUNTER — Telehealth: Payer: Self-pay | Admitting: *Deleted

## 2013-07-15 NOTE — Telephone Encounter (Signed)
sed added tx adn extra appt

## 2013-07-15 NOTE — Telephone Encounter (Signed)
07/15/13 @10 :00 am:  Mrs. Westrup called to say that Mr. Bolla saw a small amount of blood in his sputum when he coughed first thing this morning.  There was a spot about the size of quarter, then a second smaller spot about the size of a dime on his tissues.  She said that in the past when he has had hemopotisis, he has been started on prednisone.  Reassured her this was a small amount of blood and that they should not be alarmed. Discussed with Dr. Julien Nordmann, he does not want to start any new medications at this point, and agrees that this is not alarming.  He asked that the patient be instructed to call if the condition should worsen to more than a teaspoon of blood, but to reassure him that this is not uncommon. 07/15/13 @10 :30 am:  Spoke with Mrs. Dysert and conveyed Dr. Worthy Flank directions as above. Advised her to call if Mr. Marcy has greater than a teaspoon of blood in his sputum.  We discussed how to reach the on-call physician after hours and on weekends again so that she is prepared should the need arise over the weekend.  She was very reassured.

## 2013-07-19 ENCOUNTER — Telehealth: Payer: Self-pay | Admitting: Dietician

## 2013-07-19 NOTE — Telephone Encounter (Signed)
Brief Outpatient Oncology Nutrition Note  Patient has been identified to be at risk on malnutrition screen.  Wt Readings from Last 10 Encounters:  07/14/13 182 lb 9.6 oz (82.827 kg)  07/07/13 183 lb 9.6 oz (83.28 kg)  06/30/13 186 lb 14.4 oz (84.777 kg)  06/16/13 199 lb 4.8 oz (90.402 kg)  06/16/13 199 lb 4.8 oz (90.402 kg)  05/25/13 194 lb 3.2 oz (88.089 kg)    Dx:  Metastatic non-small ell lung cancer, squamous cell carcinoma initially diagnosed as stage IIIa non-small cell lung cancer diagnosed in June of 2012.  Currently undergoing chemo.   Called patient due to weight loss.  Spoke with wife who reports that patient is not eating well and has a poor appetite.  Constipation as per MD notes.  Drinks 3 cans Glucerna daily.  Discussed tips to increase calories and protein.  Continue meals plus Glucerna tid.  Will mail coupons and handouts on diet and constipation, poor appetite, and tips to increase calories and protein along with the [place] [address] contact information.  Antonieta Iba, RD, LDN

## 2013-07-20 ENCOUNTER — Other Ambulatory Visit (HOSPITAL_BASED_OUTPATIENT_CLINIC_OR_DEPARTMENT_OTHER): Payer: Medicare Other

## 2013-07-20 DIAGNOSIS — C3490 Malignant neoplasm of unspecified part of unspecified bronchus or lung: Secondary | ICD-10-CM

## 2013-07-20 DIAGNOSIS — C3430 Malignant neoplasm of lower lobe, unspecified bronchus or lung: Secondary | ICD-10-CM

## 2013-07-20 LAB — BUN AND CREATININE (CC13)
BUN: 21.7 mg/dL (ref 7.0–26.0)
Creatinine: 1.5 mg/dL — ABNORMAL HIGH (ref 0.7–1.3)

## 2013-07-21 ENCOUNTER — Other Ambulatory Visit: Payer: Medicare Other

## 2013-07-25 LAB — COMPREHENSIVE METABOLIC PANEL
ALBUMIN: 2.7 g/dL — AB (ref 3.4–5.0)
ALK PHOS: 65 U/L
AST: 15 U/L (ref 15–37)
Anion Gap: 4 — ABNORMAL LOW (ref 7–16)
BILIRUBIN TOTAL: 0.4 mg/dL (ref 0.2–1.0)
BUN: 21 mg/dL — ABNORMAL HIGH (ref 7–18)
CALCIUM: 9.5 mg/dL (ref 8.5–10.1)
Chloride: 101 mmol/L (ref 98–107)
Co2: 34 mmol/L — ABNORMAL HIGH (ref 21–32)
Creatinine: 1.48 mg/dL — ABNORMAL HIGH (ref 0.60–1.30)
EGFR (Non-African Amer.): 44 — ABNORMAL LOW
GFR CALC AF AMER: 51 — AB
GLUCOSE: 154 mg/dL — AB (ref 65–99)
OSMOLALITY: 284 (ref 275–301)
Potassium: 4.3 mmol/L (ref 3.5–5.1)
SGPT (ALT): 17 U/L (ref 12–78)
Sodium: 139 mmol/L (ref 136–145)
Total Protein: 6.9 g/dL (ref 6.4–8.2)

## 2013-07-25 LAB — CBC CANCER CENTER
Basophil #: 0.1 x10 3/mm (ref 0.0–0.1)
Basophil %: 0.7 %
Eosinophil #: 0.3 x10 3/mm (ref 0.0–0.7)
Eosinophil %: 3 %
HCT: 35.3 % — ABNORMAL LOW (ref 40.0–52.0)
HGB: 10.8 g/dL — ABNORMAL LOW (ref 13.0–18.0)
Lymphocyte #: 0.3 x10 3/mm — ABNORMAL LOW (ref 1.0–3.6)
Lymphocyte %: 2.9 %
MCH: 26.3 pg (ref 26.0–34.0)
MCHC: 30.5 g/dL — ABNORMAL LOW (ref 32.0–36.0)
MCV: 86 fL (ref 80–100)
MONO ABS: 0.8 x10 3/mm (ref 0.2–1.0)
Monocyte %: 8.4 %
Neutrophil #: 8.1 x10 3/mm — ABNORMAL HIGH (ref 1.4–6.5)
Neutrophil %: 85 %
Platelet: 247 x10 3/mm (ref 150–440)
RBC: 4.09 10*6/uL — AB (ref 4.40–5.90)
RDW: 14.8 % — AB (ref 11.5–14.5)
WBC: 9.6 x10 3/mm (ref 3.8–10.6)

## 2013-07-25 LAB — MAGNESIUM: Magnesium: 1.9 mg/dL

## 2013-07-27 ENCOUNTER — Ambulatory Visit: Payer: Self-pay | Admitting: Oncology

## 2013-07-27 ENCOUNTER — Other Ambulatory Visit: Payer: Self-pay

## 2013-07-28 ENCOUNTER — Other Ambulatory Visit (HOSPITAL_BASED_OUTPATIENT_CLINIC_OR_DEPARTMENT_OTHER): Payer: Medicare Other

## 2013-07-28 ENCOUNTER — Ambulatory Visit (HOSPITAL_BASED_OUTPATIENT_CLINIC_OR_DEPARTMENT_OTHER): Payer: Medicare Other

## 2013-07-28 ENCOUNTER — Ambulatory Visit (HOSPITAL_BASED_OUTPATIENT_CLINIC_OR_DEPARTMENT_OTHER): Payer: Medicare Other | Admitting: Physician Assistant

## 2013-07-28 ENCOUNTER — Encounter: Payer: Self-pay | Admitting: *Deleted

## 2013-07-28 ENCOUNTER — Encounter: Payer: Self-pay | Admitting: Physician Assistant

## 2013-07-28 VITALS — BP 99/51 | HR 80 | Temp 97.4°F | Resp 18 | Ht 69.0 in | Wt 181.6 lb

## 2013-07-28 DIAGNOSIS — C3430 Malignant neoplasm of lower lobe, unspecified bronchus or lung: Secondary | ICD-10-CM

## 2013-07-28 DIAGNOSIS — C3490 Malignant neoplasm of unspecified part of unspecified bronchus or lung: Secondary | ICD-10-CM

## 2013-07-28 DIAGNOSIS — B370 Candidal stomatitis: Secondary | ICD-10-CM

## 2013-07-28 DIAGNOSIS — Z5112 Encounter for antineoplastic immunotherapy: Secondary | ICD-10-CM

## 2013-07-28 DIAGNOSIS — K219 Gastro-esophageal reflux disease without esophagitis: Secondary | ICD-10-CM

## 2013-07-28 LAB — CBC WITH DIFFERENTIAL/PLATELET
BASO%: 0.8 % (ref 0.0–2.0)
Basophils Absolute: 0.1 10*3/uL (ref 0.0–0.1)
EOS%: 2.6 % (ref 0.0–7.0)
Eosinophils Absolute: 0.2 10*3/uL (ref 0.0–0.5)
HEMATOCRIT: 35 % — AB (ref 38.4–49.9)
HGB: 10.7 g/dL — ABNORMAL LOW (ref 13.0–17.1)
LYMPH%: 2.5 % — AB (ref 14.0–49.0)
MCH: 26.3 pg — AB (ref 27.2–33.4)
MCHC: 30.6 g/dL — ABNORMAL LOW (ref 32.0–36.0)
MCV: 85.8 fL (ref 79.3–98.0)
MONO#: 0.8 10*3/uL (ref 0.1–0.9)
MONO%: 8.2 % (ref 0.0–14.0)
NEUT#: 8.1 10*3/uL — ABNORMAL HIGH (ref 1.5–6.5)
NEUT%: 85.9 % — AB (ref 39.0–75.0)
PLATELETS: 243 10*3/uL (ref 140–400)
RBC: 4.07 10*6/uL — ABNORMAL LOW (ref 4.20–5.82)
RDW: 15 % — ABNORMAL HIGH (ref 11.0–14.6)
WBC: 9.5 10*3/uL (ref 4.0–10.3)
lymph#: 0.2 10*3/uL — ABNORMAL LOW (ref 0.9–3.3)

## 2013-07-28 LAB — COMPREHENSIVE METABOLIC PANEL (CC13)
ALT: 15 U/L (ref 0–55)
ANION GAP: 11 meq/L (ref 3–11)
AST: 14 U/L (ref 5–34)
Albumin: 2.8 g/dL — ABNORMAL LOW (ref 3.5–5.0)
Alkaline Phosphatase: 67 U/L (ref 40–150)
BUN: 16.4 mg/dL (ref 7.0–26.0)
CO2: 26 meq/L (ref 22–29)
CREATININE: 1.3 mg/dL (ref 0.7–1.3)
Calcium: 9.6 mg/dL (ref 8.4–10.4)
Chloride: 103 mEq/L (ref 98–109)
Glucose: 143 mg/dl — ABNORMAL HIGH (ref 70–140)
Potassium: 4.2 mEq/L (ref 3.5–5.1)
Sodium: 140 mEq/L (ref 136–145)
Total Bilirubin: 0.44 mg/dL (ref 0.20–1.20)
Total Protein: 6.6 g/dL (ref 6.4–8.3)

## 2013-07-28 LAB — TSH CHCC: TSH: 0.654 m[IU]/L (ref 0.320–4.118)

## 2013-07-28 LAB — PHOSPHORUS: Phosphorus: 3.2 mg/dL (ref 2.3–4.6)

## 2013-07-28 LAB — MAGNESIUM (CC13): MAGNESIUM: 2 mg/dL (ref 1.5–2.5)

## 2013-07-28 LAB — LACTATE DEHYDROGENASE (CC13): LDH: 167 U/L (ref 125–245)

## 2013-07-28 MED ORDER — OMEPRAZOLE 40 MG PO CPDR: 40.0000 mg | DELAYED_RELEASE_CAPSULE | Freq: Every day | ORAL | Status: DC

## 2013-07-28 MED ORDER — SODIUM CHLORIDE 0.9 % IJ SOLN
10.0000 mL | INTRAMUSCULAR | Status: DC | PRN
  Administered 2013-07-28: 10 mL
  Filled 2013-07-28: qty 10

## 2013-07-28 MED ORDER — SODIUM CHLORIDE 0.9 % IV SOLN
3.0000 mg/kg | Freq: Once | INTRAVENOUS | Status: AC
  Administered 2013-07-28: 271 mg via INTRAVENOUS
  Filled 2013-07-28: qty 27.1

## 2013-07-28 MED ORDER — HEPARIN SOD (PORK) LOCK FLUSH 100 UNIT/ML IV SOLN
500.0000 [IU] | Freq: Once | INTRAVENOUS | Status: AC | PRN
  Administered 2013-07-28: 500 [IU]
  Filled 2013-07-28: qty 5

## 2013-07-28 MED ORDER — SODIUM CHLORIDE 0.9 % IV SOLN
Freq: Once | INTRAVENOUS | Status: AC
  Administered 2013-07-28: 12:00:00 via INTRAVENOUS

## 2013-07-28 MED ORDER — MICONAZOLE 50 MG BU TABS: 50.0000 mg | ORAL_TABLET | BUCCAL | Status: DC

## 2013-07-28 NOTE — Progress Notes (Addendum)
[place] Telephone:([phone]   Fax:(336) [phone]  SHARED VISIT PROGRESS NOTE  [name], [name] Bis, MD [employer] [place] [address]  DIAGNOSIS: Metastatic non-small cell lung cancer, squamous cell carcinoma initially diagnosed as stage IIIa non-small cell lung cancer diagnosed in June of 2012.   PRIOR THERAPY: 1) status post a course of concurrent chemoradiation with weekly carboplatin and paclitaxel completed in September of 2012 under the care of Dr. Oliva Bustard. 2) the patient was followed by observation and in September of 2013 he had evidence for disease progression. 3) he was retreated again with a short course of concurrent chemoradiation with carboplatin and paclitaxel completed in November of 2013. 4) he was followed by observation until April 2014 when he was found to have evidence for disease progression. 5) He has a Soil scientist test good and the patient was started on treatment with Tarceva 150 mg by mouth daily for around 7 months discontinued on 05/02/2013 secondary to disease.   CURRENT THERAPY:immunotherapy according to the BMS clinical trial [place] 209153 with Nivolumab 3.0  MG/KG every 2 weeks, status post 3 cycles. First dose 06/16/2013.  INTERVAL HISTORY: [name] [name] 80 y.o. male returns to the clinic today for follow up visit accompanied by his wife and daughter.  He is feeling fine today except for the fatigue in addition to baseline shortness breath increased with exertion. He continues to have cough recently managed with Tessalon and Tussionex. Cough is productive of thick white secretions not associated with fever or chills. He complains increased reflux symptoms. He was taken off of his omeprazole when he was being treated with Tarceva. He also complains of a bad taste in his mouth. He has completed 2 courses of Diflucan for oral candidiasis. He notes some skin lesions on his scalp and temple area. He has a history of previous skin cancers. He  has an appointment with his dermatologist next month. Should he need surgical removal of some skin lesions he is aware that this would have to be coordinated with Dr. Julien Nordmann he is on a clinical trial.  The patient is currently off diuretics with no significant swelling of his lower extremities or worsening dyspnea. He denied having any fever or chills. The patient denied having any nausea or vomiting. He has no significant weight loss or night sweats. He does note increased fatigue and he creased ability to perform activities of daily living do to the fatigue as well as some generalized weakness. He is here today to start cycle #4 of his immunotherapy. He was also recently evaluated by Dr. Oliva Bustard at [facility].  MEDICAL HISTORY: Past Medical History  Diagnosis Date  . Lung cancer   . Hypercholesteremia   . Hypertension   . Diabetes mellitus without complication   . AAA (abdominal aortic aneurysm)   . CAD (coronary artery disease)   . Skin cancer     ALLERGIES:  has No Known Allergies.  MEDICATIONS:  Current Outpatient Prescriptions  Medication Sig Dispense Refill  . ADVAIR DISKUS 250-50 MCG/DOSE AEPB Inhale 1 puff into the lungs. BID      . ALPRAZolam (XANAX) 0.25 MG tablet Take 1 tablet by mouth 2 (two) times daily.      . benzonatate (TESSALON) 200 MG capsule Take 200 mg by mouth 3 (three) times daily as needed for cough.      . citalopram (CELEXA) 20 MG tablet Take 20 mg by mouth daily.      Marland Kitchen  fluconazole (DIFLUCAN) 100 MG tablet Take 2 tablets by mouth on day one, then take 1 tablet by mouth daily until completed. Hold cholesterol medicine while on the fluconazole  16 tablet  0  . fluticasone (FLONASE) 50 MCG/ACT nasal spray 1 spray. 1 spray in each nostril BID      . Hydrocodone-Chlorpheniramine 5-4 MG/5ML SOLN Take 5 mLs by mouth 2 (two) times daily.      . insulin aspart (NOVOLOG) 100 UNIT/ML injection Inject into the skin as directed. Check before each meal,  take as directed per sliding scale      . ipratropium-albuterol (DUONEB) 0.5-2.5 (3) MG/3ML SOLN Take 3 mLs by nebulization every 8 (eight) hours as needed.      Marland Kitchen LANTUS SOLOSTAR 100 UNIT/ML Solostar Pen 26 units at bedtime      . losartan (COZAAR) 50 MG tablet Take 50 mg by mouth daily.      . metoprolol succinate (TOPROL-XL) 100 MG 24 hr tablet Take 100 mg by mouth daily.      Marland Kitchen nystatin cream (MYCOSTATIN) 3 (three) times daily. Apply to affected area three times daily      . PROAIR HFA 108 (90 BASE) MCG/ACT inhaler Inhale 2 puffs into the lungs 4 (four) times daily as needed.      . simvastatin (ZOCOR) 20 MG tablet Take 20 mg by mouth daily.      Marland Kitchen SPIRIVA HANDIHALER 18 MCG inhalation capsule Place into inhaler and inhale daily.      . furosemide (LASIX) 20 MG tablet Take 20 mg by mouth daily.      . Miconazole 50 MG TABS Place 1 tablet (50 mg total) inside cheek every morning.  14 tablet  0  . omeprazole (PRILOSEC) 40 MG capsule Take 1 capsule (40 mg total) by mouth daily.  30 capsule  2   No current facility-administered medications for this visit.   Facility-Administered Medications Ordered in Other Visits  Medication Dose Route Frequency Provider Last Rate Last Dose  . sodium chloride 0.9 % injection 10 mL  10 mL Intracatheter PRN [name] Bears, MD   10 mL at 07/28/13 1339    SURGICAL HISTORY:  Past Surgical History  Procedure Laterality Date  . Abdominal aortic aneurysm repair  2000  . Coronary stent placement  2000  . Tonsillectomy      when he was a child    REVIEW OF SYSTEMS:  Constitutional: positive for fatigue Eyes: negative Ears, nose, mouth, throat, and face: positive for bad taste in the mouth Respiratory: positive for cough and dyspnea on exertion Cardiovascular: negative Gastrointestinal: positive for reflux symptoms Genitourinary:negative Integument/breast: positive for skin lesion(s) Hematologic/lymphatic: negative Musculoskeletal:positive for muscle  weakness Neurological: negative Behavioral/Psych: negative Endocrine: negative Allergic/Immunologic: negative   PHYSICAL EXAMINATION: General appearance: alert, cooperative, fatigued and no distress Head: Normocephalic, without obvious abnormality, atraumatic Neck: no adenopathy, no JVD, supple, symmetrical, trachea midline and thyroid not enlarged, symmetric, no tenderness/mass/nodules Lymph nodes: Cervical, supraclavicular, and axillary nodes normal. Resp: clear to auscultation bilaterally Back: symmetric, no curvature. ROM normal. No CVA tenderness. Cardio: regular rate and rhythm, S1, S2 normal, no murmur, click, rub or gallop GI: soft, non-tender; bowel sounds normal; no masses,  no organomegaly Extremities: extremities normal, atraumatic, no cyanosis or edema Neurologic: Grossly normal Skin: Proximally half centimeter dark brown raised lesion at the right superior temporal region as well as just below the right ear, similar lesions within the scalp. No bleeding Mouth: reveals persistent thrush on the tongue and  buccal mucosa  ECOG PERFORMANCE STATUS: 2 - Symptomatic, <50% confined to bed  Blood pressure 99/51, pulse 80, temperature 97.4 F (36.3 C), temperature source Oral, resp. rate 18, height 5\' 9"  (1.753 m), weight 181 lb 9.6 oz (82.373 kg), SpO2 98.00%.  LABORATORY DATA: Lab Results  Component Value Date   WBC 9.5 07/28/2013   HGB 10.7* 07/28/2013   HCT 35.0* 07/28/2013   MCV 85.8 07/28/2013   PLT 243 07/28/2013      Chemistry      Component Value Date/Time   NA 140 07/28/2013 1035   K 4.2 07/28/2013 1035   CO2 26 07/28/2013 1035   BUN 16.4 07/28/2013 1035   CREATININE 1.3 07/28/2013 1035      Component Value Date/Time   CALCIUM 9.6 07/28/2013 1035   ALKPHOS 67 07/28/2013 1035   AST 14 07/28/2013 1035   ALT 15 07/28/2013 1035   BILITOT 0.44 07/28/2013 1035       RADIOGRAPHIC STUDIES:  ASSESSMENT AND PLAN: This is a very pleasant 80 years old white male with metastatic non-small  cell lung cancer, squama cell carcinoma that was initially diagnosed as stage IIIA. He is status post several chemotherapy regimens most recently with Tarceva but has evidence for disease progression. He is currently undergoing immunotherapy with Nivolumab and tolerating his treatment fairly well status post 3 cycles. Patient was discussed with and also seen by Dr. Julien Nordmann. He'll proceed with cycle #4 of his immunotherapy with Nivolumab according to the [facility] as clinical trial. For his reflux symptoms we'll restart his omeprazole 40 mg by mouth once daily total 30 with 2 refills was sent to his pharmacy of record via E. scribed. For the persistent oral candidiasis a prescription for Oravig 50 mg place of the buccal mucosa once daily for 14 days was also sent to his pharmacy of record via E. scribed. The patient will proceed with cycle #4 today as scheduled. He will keep his appointment on 08/09/2013 for restaging CT scans as per protocol. Followup in 2 weeks prior to cycle #5 for another symptom management visit.  The patient voices understanding of current disease status and treatment options and is in agreement with the current care plan.  All questions were answered. The patient knows to call the clinic with any problems, questions or concerns. We can certainly see the patient much sooner if necessary.  Carlton Adam PA-C ADDENDUM: Hematology/Oncology Attending: I had a face to face encounter with the patient. I recommended his care plan. This is a very pleasant 80 years old white male with metastatic non-small cell lung cancer, squamous cell carcinoma currently undergoing immunotherapy according to the [facility]MS clinical trial [place] 209153 with Nivolumab. The patient is tolerating his treatment well except for persistent fatigue that has been there before starting the clinical trial. He also has lack of appetite and oral thrush that did not response to 2 courses of Diflucan. We will start the patient on  treatment with Oravig for the oral thrush. For GERD, the patient will resume his treatment with omeprazole 40 mg by mouth daily. I recommended for him to proceed with cycle #4 of his immunotherapy today as scheduled. He would come back for follow up visit in 2 weeks for reevaluation after repeating CT scan of the chest, abdomen and pelvis for restaging of his disease. He was advised to call immediately if he has any concerning symptoms in the interval.  Disclaimer: This note was dictated with voice recognition software. Similar sounding words  can inadvertently be transcribed and may not be corrected upon review. Eilleen Kempf., MD 07/28/2013

## 2013-07-28 NOTE — Patient Instructions (Signed)
[facility] Discharge Instructions for Patients Receiving Chemotherapy  Today you received the following chemotherapy agents Nivolumab To help prevent nausea and vomiting after your treatment, we encourage you to take your nausea medication as prescribed.If you develop nausea and vomiting that is not controlled by your nausea medication, call the clinic.   BELOW ARE SYMPTOMS THAT SHOULD BE REPORTED IMMEDIATELY:  *FEVER GREATER THAN 100.5 F  *CHILLS WITH OR WITHOUT FEVER  NAUSEA AND VOMITING THAT IS NOT CONTROLLED WITH YOUR NAUSEA MEDICATION  *UNUSUAL SHORTNESS OF BREATH  *UNUSUAL BRUISING OR BLEEDING  TENDERNESS IN MOUTH AND THROAT WITH OR WITHOUT PRESENCE OF ULCERS  *URINARY PROBLEMS  *BOWEL PROBLEMS  UNUSUAL RASH Items with * indicate a potential emergency and should be followed up as soon as possible.  Feel free to call the clinic you have any questions or concerns. The clinic phone number is [phone].

## 2013-07-28 NOTE — Progress Notes (Signed)
07/28/13 @ 12:00 pm, BMS [id number], Cycle 4:  [name] [name] into the [facility] for labs, to see Awilda Metro, PA and to receive treatment with nivolumab.  He completed the LCSS and EQ-5D-3L PROs prior to all other activities.  His labs were all appropriate for treatment.  In fact his creatinine was down to 1.3 mg/dL, which is below his baseline value of 1.4 mg/dL.  He will not require weekly serum creatinines at the time.  [name] [name] reports having significant reflux and says that he has had it for years, (worsening GERD, grade 2).  He had been on omeprazole until he started Tarceva.  He would like to restart it now that he is off Tarceva.  Rx provided.  He also reports an increase in his fatigue and generalized weakness (worsening fatigue, grade 2).  He continues to have symptoms of oral thrush and will receive a new agent to attempt combat it.    Obtained kit assignment from the [facility], and provided the pharmacy with the assignment.  Spoke to [employer], RN in the infusion area, emphasizing the duration of the infusion (30 minutes) and the use of the 0.22 micron filter.

## 2013-07-28 NOTE — Patient Instructions (Signed)
Resume your omeprazole for your reflux symptoms Take the Oravig as prescribed for your persistent thrush Followup in 2 weeks

## 2013-07-29 ENCOUNTER — Other Ambulatory Visit: Payer: Self-pay | Admitting: *Deleted

## 2013-08-09 ENCOUNTER — Ambulatory Visit (HOSPITAL_COMMUNITY)
Admission: RE | Admit: 2013-08-09 | Discharge: 2013-08-09 | Disposition: A | Discharge status: Home / Self Care | Payer: Medicare Other | Source: Ambulatory Visit | Attending: Internal Medicine | Admitting: Internal Medicine

## 2013-08-09 ENCOUNTER — Encounter (HOSPITAL_COMMUNITY): Payer: Self-pay

## 2013-08-09 ENCOUNTER — Ambulatory Visit (HOSPITAL_COMMUNITY): Payer: Medicare Other

## 2013-08-09 DIAGNOSIS — IMO0002 Reserved for concepts with insufficient information to code with codable children: Secondary | ICD-10-CM | POA: Insufficient documentation

## 2013-08-09 DIAGNOSIS — I319 Disease of pericardium, unspecified: Secondary | ICD-10-CM | POA: Insufficient documentation

## 2013-08-09 DIAGNOSIS — N281 Cyst of kidney, acquired: Secondary | ICD-10-CM | POA: Insufficient documentation

## 2013-08-09 DIAGNOSIS — C3490 Malignant neoplasm of unspecified part of unspecified bronchus or lung: Secondary | ICD-10-CM | POA: Insufficient documentation

## 2013-08-09 DIAGNOSIS — R918 Other nonspecific abnormal finding of lung field: Secondary | ICD-10-CM | POA: Insufficient documentation

## 2013-08-09 DIAGNOSIS — J90 Pleural effusion, not elsewhere classified: Secondary | ICD-10-CM | POA: Insufficient documentation

## 2013-08-09 MED ORDER — IOHEXOL 300 MG/ML  SOLN
100.0000 mL | Freq: Once | INTRAMUSCULAR | Status: AC | PRN
  Administered 2013-08-09: 100 mL via INTRAVENOUS

## 2013-08-10 ENCOUNTER — Other Ambulatory Visit: Payer: Self-pay | Admitting: *Deleted

## 2013-08-10 ENCOUNTER — Inpatient Hospital Stay (HOSPITAL_COMMUNITY): Admission: RE | Admit: 2013-08-10 | Payer: Medicare Other | Source: Ambulatory Visit

## 2013-08-11 ENCOUNTER — Encounter: Payer: Self-pay | Admitting: *Deleted

## 2013-08-11 ENCOUNTER — Encounter: Payer: Self-pay | Admitting: Internal Medicine

## 2013-08-11 ENCOUNTER — Telehealth: Payer: Self-pay | Admitting: Internal Medicine

## 2013-08-11 ENCOUNTER — Telehealth: Payer: Self-pay | Admitting: *Deleted

## 2013-08-11 ENCOUNTER — Other Ambulatory Visit (HOSPITAL_BASED_OUTPATIENT_CLINIC_OR_DEPARTMENT_OTHER): Payer: Medicare Other

## 2013-08-11 ENCOUNTER — Ambulatory Visit (HOSPITAL_BASED_OUTPATIENT_CLINIC_OR_DEPARTMENT_OTHER): Payer: Medicare Other | Admitting: Internal Medicine

## 2013-08-11 ENCOUNTER — Ambulatory Visit (HOSPITAL_BASED_OUTPATIENT_CLINIC_OR_DEPARTMENT_OTHER): Payer: Medicare Other

## 2013-08-11 VITALS — BP 120/65 | HR 82 | Temp 98.2°F | Resp 18

## 2013-08-11 VITALS — BP 120/61 | HR 78 | Temp 98.2°F | Resp 19 | Ht 69.0 in | Wt 187.6 lb

## 2013-08-11 DIAGNOSIS — Z5112 Encounter for antineoplastic immunotherapy: Secondary | ICD-10-CM

## 2013-08-11 DIAGNOSIS — R609 Edema, unspecified: Secondary | ICD-10-CM

## 2013-08-11 DIAGNOSIS — C3490 Malignant neoplasm of unspecified part of unspecified bronchus or lung: Secondary | ICD-10-CM

## 2013-08-11 DIAGNOSIS — R0602 Shortness of breath: Secondary | ICD-10-CM

## 2013-08-11 DIAGNOSIS — C3430 Malignant neoplasm of lower lobe, unspecified bronchus or lung: Secondary | ICD-10-CM

## 2013-08-11 DIAGNOSIS — J90 Pleural effusion, not elsewhere classified: Secondary | ICD-10-CM

## 2013-08-11 LAB — CBC WITH DIFFERENTIAL/PLATELET
BASO%: 0.6 % (ref 0.0–2.0)
Basophils Absolute: 0 10*3/uL (ref 0.0–0.1)
EOS%: 3.6 % (ref 0.0–7.0)
Eosinophils Absolute: 0.3 10*3/uL (ref 0.0–0.5)
HCT: 33.4 % — ABNORMAL LOW (ref 38.4–49.9)
HGB: 10.4 g/dL — ABNORMAL LOW (ref 13.0–17.1)
LYMPH#: 0.3 10*3/uL — AB (ref 0.9–3.3)
LYMPH%: 3.5 % — ABNORMAL LOW (ref 14.0–49.0)
MCH: 26.4 pg — ABNORMAL LOW (ref 27.2–33.4)
MCHC: 31 g/dL — ABNORMAL LOW (ref 32.0–36.0)
MCV: 85 fL (ref 79.3–98.0)
MONO#: 0.7 10*3/uL (ref 0.1–0.9)
MONO%: 8 % (ref 0.0–14.0)
NEUT%: 84.3 % — ABNORMAL HIGH (ref 39.0–75.0)
NEUTROS ABS: 7.3 10*3/uL — AB (ref 1.5–6.5)
Platelets: 241 10*3/uL (ref 140–400)
RBC: 3.93 10*6/uL — AB (ref 4.20–5.82)
RDW: 15.2 % — ABNORMAL HIGH (ref 11.0–14.6)
WBC: 8.7 10*3/uL (ref 4.0–10.3)

## 2013-08-11 LAB — COMPREHENSIVE METABOLIC PANEL (CC13)
ALT: 12 U/L (ref 0–55)
ANION GAP: 9 meq/L (ref 3–11)
AST: 11 U/L (ref 5–34)
Albumin: 2.9 g/dL — ABNORMAL LOW (ref 3.5–5.0)
Alkaline Phosphatase: 66 U/L (ref 40–150)
BUN: 17.6 mg/dL (ref 7.0–26.0)
CALCIUM: 9.1 mg/dL (ref 8.4–10.4)
CHLORIDE: 104 meq/L (ref 98–109)
CO2: 32 meq/L — AB (ref 22–29)
Creatinine: 1.1 mg/dL (ref 0.7–1.3)
Glucose: 113 mg/dl (ref 70–140)
Potassium: 4.4 mEq/L (ref 3.5–5.1)
SODIUM: 144 meq/L (ref 136–145)
Total Bilirubin: 0.3 mg/dL (ref 0.20–1.20)
Total Protein: 6.4 g/dL (ref 6.4–8.3)

## 2013-08-11 LAB — PHOSPHORUS: PHOSPHORUS: 3.8 mg/dL (ref 2.3–4.6)

## 2013-08-11 LAB — MAGNESIUM (CC13): Magnesium: 2 mg/dl (ref 1.5–2.5)

## 2013-08-11 LAB — LACTATE DEHYDROGENASE (CC13): LDH: 142 U/L (ref 125–245)

## 2013-08-11 MED ORDER — SODIUM CHLORIDE 0.9 % IV SOLN
Freq: Once | INTRAVENOUS | Status: AC
  Administered 2013-08-11: 13:00:00 via INTRAVENOUS

## 2013-08-11 MED ORDER — FUROSEMIDE 20 MG PO TABS: 20.0000 mg | ORAL_TABLET | Freq: Every day | ORAL | Status: DC

## 2013-08-11 MED ORDER — SODIUM CHLORIDE 0.9 % IJ SOLN
10.0000 mL | INTRAMUSCULAR | Status: DC | PRN
  Administered 2013-08-11: 10 mL
  Filled 2013-08-11: qty 10

## 2013-08-11 MED ORDER — ALPRAZOLAM 0.25 MG PO TABS: 0.2500 mg | ORAL_TABLET | Freq: Two times a day (BID) | ORAL | Status: DC

## 2013-08-11 MED ORDER — SODIUM CHLORIDE 0.9 % IV SOLN
3.0000 mg/kg | Freq: Once | INTRAVENOUS | Status: AC
  Administered 2013-08-11: 271 mg via INTRAVENOUS
  Filled 2013-08-11: qty 27.1

## 2013-08-11 MED ORDER — HEPARIN SOD (PORK) LOCK FLUSH 100 UNIT/ML IV SOLN
500.0000 [IU] | Freq: Once | INTRAVENOUS | Status: AC | PRN
  Administered 2013-08-11: 500 [IU]
  Filled 2013-08-11: qty 5

## 2013-08-11 NOTE — Telephone Encounter (Signed)
Per staff message and POF I have scheduled appts.  JMW  

## 2013-08-11 NOTE — Telephone Encounter (Signed)
Gave pt appt for lab,md and chemo for APril and May emailed Sharyn Lull regarding chemo for MAy

## 2013-08-11 NOTE — Progress Notes (Signed)
08/11/13 @ 12:30 pm, BMS [id number], Cycle 5:  Mr. Weakland, accompanied by his wife and daughter, into the [facility] for labs, to see Dr. Julien Nordmann and to receive nivolumab.  After his labs were collected, we reviewed the latest consent dated 07/22/13 with attention to the changes in the consent.  I read and explained each of the changes, and neither Mr. Bagheri nor his family had and questions or reservations about continuing on protocol treatment.  He proceed to sign the consent, and then was given a copy of it for his records.  His labs all proved to be appropriate for treatment, and after his assessment by Dr. Julien Nordmann, he was ready to proceed to treatment.  Mr. Thornhill has one more dose of the Miconazole tablets remaining, and his mouth appears clear of thrush.  He says his GERD has improved somewhat since re-starting Prilosec.  He has some fairly minimal swelling in his ankles bilaterally, so will resume Lasix on a prn basis.  He was instructed to take Lasix until swelling improves, then to stop it until it recurs. He denied any changes in his cough or breathing over the last two weeks.  He said he "feels a little better." Spoke with Royce Macadamia, RN in the infusion room about the duration of the infusion and the requirements for using a 0.22 micron in-line filter.  Provided her with a sign with the same information.

## 2013-08-11 NOTE — Patient Instructions (Signed)
[facility] Discharge Instructions for Patients Receiving Chemotherapy  Today you received the following chemotherapy agents nivolumab.    If you develop nausea and vomiting, call the clinic.   BELOW ARE SYMPTOMS THAT SHOULD BE REPORTED IMMEDIATELY:  *FEVER GREATER THAN 100.5 F  *CHILLS WITH OR WITHOUT FEVER  NAUSEA AND VOMITING THAT IS NOT CONTROLLED WITH YOUR NAUSEA MEDICATION  *UNUSUAL SHORTNESS OF BREATH  *UNUSUAL BRUISING OR BLEEDING  TENDERNESS IN MOUTH AND THROAT WITH OR WITHOUT PRESENCE OF ULCERS  *URINARY PROBLEMS  *BOWEL PROBLEMS  UNUSUAL RASH Items with * indicate a potential emergency and should be followed up as soon as possible.  Feel free to call the clinic you have any questions or concerns. The clinic phone number is [phone].

## 2013-08-11 NOTE — Progress Notes (Signed)
[facility] [phone]   [phone]  OFFICE PROGRESS NOTE  [name], [name] Bis, MD [place] [address]  DIAGNOSIS: Metastatic non-small cell lung cancer, squamous cell carcinoma initially diagnosed as stage IIIa non-small cell lung cancer diagnosed in June of 2012.   PRIOR THERAPY: 1) status post a course of concurrent chemoradiation with weekly carboplatin and paclitaxel completed in September of 2012 under the care of Dr. Oliva [name]. 2) the patient was followed by observation and in September of 2013 he had evidence for disease progression. 3) he was retreated again with a short course of concurrent chemoradiation with carboplatin and paclitaxel completed in November of 2013. 4) he was followed by observation until April 2014 when he was found to have evidence for disease progression. 5) He has a Soil scientist test good and the patient was started on treatment with Tarceva 150 mg by mouth daily for around 7 months discontinued on 05/02/2013 secondary to disease.   CURRENT THERAPY:immunotherapy according to the BMS clinical trial [place] [id number] with Nivolumab 3.0  MG/KG every 2 weeks, status post 4 cycles. First dose 06/16/2013.   INTERVAL HISTORY: [name] [name] 80 y.o. male returns to the clinic today for follow up visit accompanied by his wife and daughter.  He is feeling fine today except for the fatigue in addition to baseline shortness breath increased with exertion.  His cough has significantly improved. The patient is currently off diuretics but to starts to have swelling of his lower extremities and worsening dyspnea. He denied having any fever or chills. The patient denied having any nausea or vomiting, He has no significant weight loss or night sweats. He has repeat CT scan of the chest, abdomen and pelvis performed recently and he is here for evaluation and discussion of his scan results.  MEDICAL HISTORY: Past Medical History  Diagnosis  Date  . Lung cancer   . Hypercholesteremia   . Hypertension   . Diabetes mellitus without complication   . AAA (abdominal aortic aneurysm)   . CAD (coronary artery disease)   . Skin cancer     ALLERGIES:  has No Known Allergies.  MEDICATIONS:  Current Outpatient Prescriptions  Medication Sig Dispense Refill  . ADVAIR DISKUS 250-50 MCG/DOSE AEPB Inhale 1 puff into the lungs. BID      . ALPRAZolam (XANAX) 0.25 MG tablet Take 1 tablet by mouth 2 (two) times daily.      . benzonatate (TESSALON) 200 MG capsule Take 200 mg by mouth 3 (three) times daily as needed for cough.      . citalopram (CELEXA) 20 MG tablet Take 20 mg by mouth daily.      . fluconazole (DIFLUCAN) 100 MG tablet Take 2 tablets by mouth on day one, then take 1 tablet by mouth daily until completed. Hold cholesterol medicine while on the fluconazole  16 tablet  0  . fluticasone (FLONASE) 50 MCG/ACT nasal spray 1 spray. 1 spray in each nostril BID      . furosemide (LASIX) 20 MG tablet Take 20 mg by mouth daily.      . Hydrocodone-Chlorpheniramine 5-4 MG/5ML SOLN Take 5 mLs by mouth 2 (two) times daily.      . insulin aspart (NOVOLOG) 100 UNIT/ML injection Inject into the skin as directed. Check before each meal, take as directed per sliding scale      . ipratropium-albuterol (DUONEB) 0.5-2.5 (3) MG/3ML SOLN Take 3 mLs by nebulization every 8 (eight) hours as  needed.      Marland Kitchen LANTUS SOLOSTAR 100 UNIT/ML Solostar Pen 26 units at bedtime      . losartan (COZAAR) 50 MG tablet Take 50 mg by mouth daily.      . metoprolol succinate (TOPROL-XL) 100 MG 24 hr tablet Take 100 mg by mouth daily.      . Miconazole 50 MG TABS Place 1 tablet (50 mg total) inside cheek every morning.  14 tablet  0  . nystatin cream (MYCOSTATIN) 3 (three) times daily. Apply to affected area three times daily      . omeprazole (PRILOSEC) 40 MG capsule Take 1 capsule (40 mg total) by mouth daily.  30 capsule  2  . PROAIR HFA 108 (90 BASE) MCG/ACT inhaler  Inhale 2 puffs into the lungs 4 (four) times daily as needed.      . simvastatin (ZOCOR) 20 MG tablet Take 20 mg by mouth daily.      Marland Kitchen SPIRIVA HANDIHALER 18 MCG inhalation capsule Place into inhaler and inhale daily.       No current facility-administered medications for this visit.    SURGICAL HISTORY:  Past Surgical History  Procedure Laterality Date  . Abdominal aortic aneurysm repair  2000  . Coronary stent placement  2000  . Tonsillectomy      when he was a child    REVIEW OF SYSTEMS:  Constitutional: positive for fatigue Eyes: negative Ears, nose, mouth, throat, and face: negative Respiratory: positive for cough and dyspnea on exertion Cardiovascular: negative Gastrointestinal: negative Genitourinary:negative Integument/breast: negative Hematologic/lymphatic: negative Musculoskeletal:negative Neurological: negative Behavioral/Psych: negative Endocrine: negative Allergic/Immunologic: negative   PHYSICAL EXAMINATION: General appearance: alert, cooperative, fatigued and no distress Head: Normocephalic, without obvious abnormality, atraumatic Neck: no adenopathy, no JVD, supple, symmetrical, trachea midline and thyroid not enlarged, symmetric, no tenderness/mass/nodules Lymph nodes: Cervical, supraclavicular, and axillary nodes normal. Resp: wheezes RLL and RML Back: symmetric, no curvature. ROM normal. No CVA tenderness. Cardio: regular rate and rhythm, S1, S2 normal, no murmur, click, rub or gallop GI: soft, non-tender; bowel sounds normal; no masses,  no organomegaly Extremities: extremities normal, atraumatic, no cyanosis or edema Neurologic: Alert and oriented X 3, normal strength and tone. Normal symmetric reflexes. Normal coordination and gait  ECOG PERFORMANCE STATUS: 1 - Symptomatic but completely ambulatory  Blood pressure 120/61, pulse 78, temperature 98.2 F (36.8 C), temperature source Oral, resp. rate 19, height 5\' 9"  (1.753 m), weight 187 lb 9.6 oz  (85.095 kg), SpO2 98.00%.  LABORATORY DATA: Lab Results  Component Value Date   WBC 8.7 08/11/2013   HGB 10.4* 08/11/2013   HCT 33.4* 08/11/2013   MCV 85.0 08/11/2013   PLT 241 08/11/2013      Chemistry      Component Value Date/Time   NA 144 08/11/2013 1110   K 4.4 08/11/2013 1110   CO2 32* 08/11/2013 1110   BUN 17.6 08/11/2013 1110   CREATININE 1.1 08/11/2013 1110      Component Value Date/Time   CALCIUM 9.1 08/11/2013 1110   ALKPHOS 66 08/11/2013 1110   AST 11 08/11/2013 1110   ALT 12 08/11/2013 1110   BILITOT 0.30 08/11/2013 1110       RADIOGRAPHIC STUDIES: Ct Chest W Contrast  08/09/2013   CLINICAL DATA:  Followup lung cancer  EXAM: CT CHEST, ABDOMEN, AND PELVIS WITH CONTRAST  TECHNIQUE: Multidetector CT imaging of the chest, abdomen and pelvis was performed following the standard protocol during bolus administration of intravenous contrast.  CONTRAST:  146mL OMNIPAQUE  IOHEXOL 300 MG/ML  SOLN  COMPARISON:  06/07/2013  RECIST 1.1 Target Lesions:  1. Right lower lobe mass measures 8.1 cm, image 35/series 2. Previously 8.7 cm.  FINDINGS:   CT CHEST FINDINGS  There is a moderate volume right pleural effusion which appears loculated. This is similar in volume to the previous exam. Lung mass in the right lower lobe encasing the right hilar structures measures 8.1 cm, image 35/series 2. Previously 8.7 cm. The peripheral nodular densities within the superior segment of left lower lobe are likely postinflammatory, image 26/series 4. This is unchanged from previous exam. Adjacent nodule measures 3 mm, image 25/series 4 and is unchanged from previous exam. Sub solid nodule within the superior segment of left lower lobe measures 6 mm, image 20/ series 4. Previously 5 mm. Anterior right upper lobe nodule measures 5 mm, image 18/series 4. This is stable from previous exam. No new or progressive nodularity identified.  Normal heart size. Small pericardial effusion identified. There is no enlarged  mediastinal or or left hilar lymph nodes. No enlarged axillary or supraclavicular lymph nodes. Low attenuation nodule in the left lobe of thyroid gland measures 2.6 cm, image 9/series 2. This is unchanged from previous exam.  Review of the visualized bony structures is significant for mild multi level degenerative disc disease.    CT ABDOMEN AND PELVIS FINDINGS  No focal liver abnormality identified. The stress set there are several small stones noted within the neck of the gallbladder. No biliary dilatation. Normal appearance of the pancreas. The spleen is unremarkable.  The adrenal glands are both normal. Bilateral renal cysts are noted. Within the inferior pole of the left kidney there is a nonobstructing stone measuring 5 mm, image 74/series 2.  Postoperative change from abdominal aortic aneurysm repair noted. No enlarged upper abdominal lymph nodes identified.  The stomach is normal. The small bowel loops have a normal course and caliber without obstruction. Visualized portions of the colon are within normal limits.  Review of the visualized osseous structures is significant for mild spondylosis within the thoracic and lumbar spine. No aggressive lytic or sclerotic bone lesions identified.    IMPRESSION: 1. Mild decrease in size of right lower lobe lung mass. 2. No change in right pleural effusion. 3. Small nonspecific pulmonary nodules in the left lung are unchanged from previous exam. 4. No evidence for metastatic disease within the upper abdomen.   Electronically Signed   By: Kerby Moors M.D.   On: 08/09/2013 13:24   ASSESSMENT AND PLAN: This is a very pleasant 80 years old white male with metastatic non-small cell lung cancer, squamous cell carcinoma that was initially diagnosed as stage IIIA. He is status post several chemotherapy regimens most recently with Tarceva but has evidence for disease progression. He is currently undergoing immunotherapy with Nivolumab and tolerating his treatment  fairly well status post 4 cycles. His recent CT scan of the chest, abdomen and pelvis showed mild decrease in the size of the right lower lobe mass with a stable disease in the abdomen and pelvis. I discussed the scan results with the patient and his family. I recommended for the patient to proceed with cycle #5 today as scheduled. For the lower extremity edema and shortness of breath, I will start the patient again on Lasix 20 mg by mouth daily as needed. The patient was also given a refill for Xanax to be used on as-needed basis for anxiety. He would come back for follow up visit in 2 weeks  for reevaluation and management any adverse effect of his treatment.  The patient voices understanding of current disease status and treatment options and is in agreement with the current care plan.  All questions were answered. The patient knows to call the clinic with any problems, questions or concerns. We can certainly see the patient much sooner if necessary.  Disclaimer: This note was dictated with voice recognition software. Similar sounding words can inadvertently be transcribed and may not be corrected upon review.

## 2013-08-12 ENCOUNTER — Telehealth: Payer: Self-pay | Admitting: Internal Medicine

## 2013-08-12 NOTE — Telephone Encounter (Signed)
Talked to pt's wife gave her appt for lab,md and chemo for APril and May

## 2013-08-13 ENCOUNTER — Encounter: Payer: Self-pay | Admitting: Internal Medicine

## 2013-08-18 ENCOUNTER — Telehealth: Payer: Self-pay | Admitting: Internal Medicine

## 2013-08-18 ENCOUNTER — Other Ambulatory Visit: Payer: Medicare Other

## 2013-08-18 NOTE — Telephone Encounter (Signed)
S/w the pt's wife and she is aware to pick up a may appt calendar when she comes in next week.

## 2013-08-25 ENCOUNTER — Encounter: Payer: Self-pay | Admitting: *Deleted

## 2013-08-25 ENCOUNTER — Encounter: Payer: Self-pay | Admitting: Internal Medicine

## 2013-08-25 ENCOUNTER — Ambulatory Visit (HOSPITAL_BASED_OUTPATIENT_CLINIC_OR_DEPARTMENT_OTHER): Payer: Medicare Other | Admitting: Physician Assistant

## 2013-08-25 ENCOUNTER — Encounter: Payer: Self-pay | Admitting: Physician Assistant

## 2013-08-25 ENCOUNTER — Other Ambulatory Visit (HOSPITAL_BASED_OUTPATIENT_CLINIC_OR_DEPARTMENT_OTHER): Payer: Medicare Other

## 2013-08-25 ENCOUNTER — Ambulatory Visit (HOSPITAL_BASED_OUTPATIENT_CLINIC_OR_DEPARTMENT_OTHER): Payer: Medicare Other

## 2013-08-25 ENCOUNTER — Telehealth: Payer: Self-pay | Admitting: Physician Assistant

## 2013-08-25 VITALS — BP 111/71 | HR 82 | Temp 97.5°F | Resp 20 | Ht 69.0 in | Wt 184.1 lb

## 2013-08-25 DIAGNOSIS — C3430 Malignant neoplasm of lower lobe, unspecified bronchus or lung: Secondary | ICD-10-CM

## 2013-08-25 DIAGNOSIS — C3490 Malignant neoplasm of unspecified part of unspecified bronchus or lung: Secondary | ICD-10-CM

## 2013-08-25 DIAGNOSIS — B370 Candidal stomatitis: Secondary | ICD-10-CM

## 2013-08-25 DIAGNOSIS — Z5112 Encounter for antineoplastic immunotherapy: Secondary | ICD-10-CM

## 2013-08-25 LAB — COMPREHENSIVE METABOLIC PANEL (CC13)
ALK PHOS: 64 U/L (ref 40–150)
ALT: 10 U/L (ref 0–55)
ANION GAP: 11 meq/L (ref 3–11)
AST: 10 U/L (ref 5–34)
Albumin: 3 g/dL — ABNORMAL LOW (ref 3.5–5.0)
BUN: 19.6 mg/dL (ref 7.0–26.0)
CO2: 29 meq/L (ref 22–29)
Calcium: 9.4 mg/dL (ref 8.4–10.4)
Chloride: 103 mEq/L (ref 98–109)
Creatinine: 1.3 mg/dL (ref 0.7–1.3)
Glucose: 150 mg/dl — ABNORMAL HIGH (ref 70–140)
Potassium: 3.7 mEq/L (ref 3.5–5.1)
SODIUM: 143 meq/L (ref 136–145)
TOTAL PROTEIN: 6.3 g/dL — AB (ref 6.4–8.3)
Total Bilirubin: 0.38 mg/dL (ref 0.20–1.20)

## 2013-08-25 LAB — CBC WITH DIFFERENTIAL/PLATELET
BASO%: 0.5 % (ref 0.0–2.0)
Basophils Absolute: 0.1 10*3/uL (ref 0.0–0.1)
EOS%: 3.4 % (ref 0.0–7.0)
Eosinophils Absolute: 0.3 10*3/uL (ref 0.0–0.5)
HEMATOCRIT: 36.6 % — AB (ref 38.4–49.9)
HGB: 11.1 g/dL — ABNORMAL LOW (ref 13.0–17.1)
LYMPH%: 4 % — ABNORMAL LOW (ref 14.0–49.0)
MCH: 25.9 pg — AB (ref 27.2–33.4)
MCHC: 30.3 g/dL — AB (ref 32.0–36.0)
MCV: 85.4 fL (ref 79.3–98.0)
MONO#: 0.8 10*3/uL (ref 0.1–0.9)
MONO%: 8 % (ref 0.0–14.0)
NEUT#: 8.6 10*3/uL — ABNORMAL HIGH (ref 1.5–6.5)
NEUT%: 84.1 % — AB (ref 39.0–75.0)
PLATELETS: 203 10*3/uL (ref 140–400)
RBC: 4.29 10*6/uL (ref 4.20–5.82)
RDW: 16 % — ABNORMAL HIGH (ref 11.0–14.6)
WBC: 10.3 10*3/uL (ref 4.0–10.3)
lymph#: 0.4 10*3/uL — ABNORMAL LOW (ref 0.9–3.3)

## 2013-08-25 LAB — MAGNESIUM (CC13): MAGNESIUM: 1.8 mg/dL (ref 1.5–2.5)

## 2013-08-25 LAB — PHOSPHORUS: PHOSPHORUS: 2.5 mg/dL (ref 2.3–4.6)

## 2013-08-25 LAB — LACTATE DEHYDROGENASE (CC13): LDH: 171 U/L (ref 125–245)

## 2013-08-25 MED ORDER — NYSTATIN 100000 UNIT/ML MT SUSP: 5.0000 mL | Freq: Four times a day (QID) | OROMUCOSAL | Status: DC

## 2013-08-25 MED ORDER — SODIUM CHLORIDE 0.9 % IJ SOLN
10.0000 mL | INTRAMUSCULAR | Status: DC | PRN
  Administered 2013-08-25: 10 mL
  Filled 2013-08-25: qty 10

## 2013-08-25 MED ORDER — SODIUM CHLORIDE 0.9 % IV SOLN
3.0000 mg/kg | Freq: Once | INTRAVENOUS | Status: AC
  Administered 2013-08-25: 271 mg via INTRAVENOUS
  Filled 2013-08-25: qty 27.1

## 2013-08-25 MED ORDER — SODIUM CHLORIDE 0.9 % IV SOLN
Freq: Once | INTRAVENOUS | Status: AC
  Administered 2013-08-25: 13:00:00 via INTRAVENOUS

## 2013-08-25 MED ORDER — HEPARIN SOD (PORK) LOCK FLUSH 100 UNIT/ML IV SOLN
500.0000 [IU] | Freq: Once | INTRAVENOUS | Status: AC | PRN
  Administered 2013-08-25: 500 [IU]
  Filled 2013-08-25: qty 5

## 2013-08-25 NOTE — Progress Notes (Signed)
08/25/13 @ 12:05 pm, BMS [id number], Cycle 6:  Mr. Greulich, accompanied by his wife and daughter, into the [facility] for labs, to see Awilda Metro, PA and to receive treatment with nivolumab.  His labs were all appropriate for treatment.  He has not started any new medications and does not report any new or worsening symptoms.   Obtained kit assignment through [facility], and gave that to the pharmacy.  Spoke with Oneta Rack, RN in the infusion room about treatment today and provided her with a sign for the pump.  Emphasized the 30 minute research mandated infusion time and the use of a 0.22 micron filter.

## 2013-08-25 NOTE — Patient Instructions (Signed)
Follow up in 2 weeks prior to your next scheduled cycle of immunotherapy 

## 2013-08-25 NOTE — Telephone Encounter (Signed)
pt req a copy of new sch-printed and gave copy to pt

## 2013-08-25 NOTE — Progress Notes (Addendum)
[place] [phone]   [phone]  SHARED VISIT PROGRESS NOTE  [name], [name], MD [place] [address]  DIAGNOSIS: Metastatic non-small cell lung cancer, squamous cell carcinoma initially diagnosed as stage IIIa non-small cell lung cancer diagnosed in June of 2012.   PRIOR THERAPY: 1) status post a course of concurrent chemoradiation with weekly carboplatin and paclitaxel completed in September of 2012 under the care of Dr. Oliva Bustard. 2) the patient was followed by observation and in September of 2013 he had evidence for disease progression. 3) he was retreated again with a short course of concurrent chemoradiation with carboplatin and paclitaxel completed in November of 2013. 4) he was followed by observation until April 2014 when he was found to have evidence for disease progression. 5) He has a Soil scientist test good and the patient was started on treatment with Tarceva 150 mg by mouth daily for around 7 months discontinued on 05/02/2013 secondary to disease.   CURRENT THERAPY:immunotherapy according to the BMS clinical trial [place] [id number] with Nivolumab 3.0  MG/KG every 2 weeks, status post 5 cycles. First dose 06/16/2013.   INTERVAL HISTORY: [name] [name] 80 y.o. male returns to the clinic today for follow up visit accompanied by his wife and daughter.  He is feeling fine today except for the fatigue in addition to baseline shortness breath increased with exertion. He reports that food is starting to taste odd again. He also reports an itchy mucinous groin and is using a nystatin cream was prescribed by his primary care physician. He's had significant decrease in the ankle swelling and per Dr. Worthy Flank instructions has discontinued the Lasix.  He denied having any fever or chills. The patient denied having any nausea or vomiting. He denied any diarrhea or other skin rash or itchiness.  He has no significant weight loss or night sweats.  Overall is tolerating his immunotherapy with Nivolumab relatively well.  MEDICAL HISTORY: Past Medical History  Diagnosis Date  . Lung cancer   . Hypercholesteremia   . Hypertension   . Diabetes mellitus without complication   . AAA (abdominal aortic aneurysm)   . CAD (coronary artery disease)   . Skin cancer     ALLERGIES:  has No Known Allergies.  MEDICATIONS:  Current Outpatient Prescriptions  Medication Sig Dispense Refill  . ADVAIR DISKUS 250-50 MCG/DOSE AEPB Inhale 1 puff into the lungs. BID      . ALPRAZolam (XANAX) 0.25 MG tablet Take 1 tablet (0.25 mg total) by mouth 2 (two) times daily.  30 tablet  0  . benzonatate (TESSALON) 200 MG capsule Take 200 mg by mouth 3 (three) times daily as needed for cough.      . citalopram (CELEXA) 20 MG tablet Take 20 mg by mouth daily.      . furosemide (LASIX) 20 MG tablet       . Hydrocodone-Chlorpheniramine 5-4 MG/5ML SOLN Take 5 mLs by mouth 2 (two) times daily.      Marland Kitchen ipratropium-albuterol (DUONEB) 0.5-2.5 (3) MG/3ML SOLN Take 3 mLs by nebulization every 8 (eight) hours as needed.      Marland Kitchen LANTUS SOLOSTAR 100 UNIT/ML Solostar Pen 26 units at bedtime      . losartan (COZAAR) 50 MG tablet Take 50 mg by mouth daily.      . metoprolol succinate (TOPROL-XL) 100 MG 24 hr tablet Take 100 mg by mouth daily.      Marland Kitchen nystatin cream (MYCOSTATIN) 3 (three) times  daily. Apply to affected area three times daily      . omeprazole (PRILOSEC) 40 MG capsule Take 1 capsule (40 mg total) by mouth daily.  30 capsule  2  . PROAIR HFA 108 (90 BASE) MCG/ACT inhaler Inhale 2 puffs into the lungs 4 (four) times daily as needed.      Marland Kitchen SPIRIVA HANDIHALER 18 MCG inhalation capsule Place into inhaler and inhale daily.      . fluticasone (FLONASE) 50 MCG/ACT nasal spray 1 spray. 1 spray in each nostril BID      . insulin aspart (NOVOLOG) 100 UNIT/ML injection Inject into the skin as directed. Check before each meal, take as directed per sliding scale      .  Miconazole 50 MG TABS Place 1 tablet (50 mg total) inside cheek every morning.  14 tablet  0  . nystatin (MYCOSTATIN) 100000 UNIT/ML suspension Take 5 mLs (500,000 Units total) by mouth 4 (four) times daily.  240 mL  0  . simvastatin (ZOCOR) 20 MG tablet Take 20 mg by mouth daily.       No current facility-administered medications for this visit.   Facility-Administered Medications Ordered in Other Visits  Medication Dose Route Frequency Provider Last Rate Last Dose  . sodium chloride 0.9 % injection 10 mL  10 mL Intracatheter PRN Carlton Adam, PA-C   10 mL at 08/25/13 1355    SURGICAL HISTORY:  Past Surgical History  Procedure Laterality Date  . Abdominal aortic aneurysm repair  2000  . Coronary stent placement  2000  . Tonsillectomy      when he was a child    REVIEW OF SYSTEMS:  Constitutional: positive for fatigue Eyes: negative Ears, nose, mouth, throat, and face: positive for Notes that food has begun to taste "odd" again Respiratory: positive for dyspnea on exertion Cardiovascular: negative Gastrointestinal: negative Genitourinary:negative Integument/breast: negative Hematologic/lymphatic: negative Musculoskeletal:negative Neurological: negative Behavioral/Psych: negative Endocrine: negative Allergic/Immunologic: negative   PHYSICAL EXAMINATION: General appearance: alert, cooperative, fatigued and no distress Head: Normocephalic, without obvious abnormality, atraumatic Neck: no adenopathy, no JVD, supple, symmetrical, trachea midline and thyroid not enlarged, symmetric, no tenderness/mass/nodules Lymph nodes: Cervical, supraclavicular, and axillary nodes normal. Resp: wheezes RLL and RML Back: symmetric, no curvature. ROM normal. No CVA tenderness. Cardio: regular rate and rhythm, S1, S2 normal, no murmur, click, rub or gallop GI: soft, non-tender; bowel sounds normal; no masses,  no organomegaly Extremities: extremities normal, atraumatic, no cyanosis or  edema Neurologic: Alert and oriented X 3, normal strength and tone. Normal symmetric reflexes. Normal coordination and gait  ECOG PERFORMANCE STATUS: 1 - Symptomatic but completely ambulatory  Blood pressure 111/71, pulse 82, temperature 97.5 F (36.4 C), temperature source Oral, resp. rate 20, height 5\' 9"  (1.753 m), weight 184 lb 1.6 oz (83.507 kg), SpO2 100.00%.  LABORATORY DATA: Lab Results  Component Value Date   WBC 10.3 08/25/2013   HGB 11.1* 08/25/2013   HCT 36.6* 08/25/2013   MCV 85.4 08/25/2013   PLT 203 08/25/2013      Chemistry      Component Value Date/Time   NA 143 08/25/2013 1052   K 3.7 08/25/2013 1052   CO2 29 08/25/2013 1052   BUN 19.6 08/25/2013 1052   CREATININE 1.3 08/25/2013 1052      Component Value Date/Time   CALCIUM 9.4 08/25/2013 1052   ALKPHOS 64 08/25/2013 1052   AST 10 08/25/2013 1052   ALT 10 08/25/2013 1052   BILITOT 0.38 08/25/2013 1052  RADIOGRAPHIC STUDIES: Ct Chest W Contrast  08/09/2013   CLINICAL DATA:  Followup lung cancer  EXAM: CT CHEST, ABDOMEN, AND PELVIS WITH CONTRAST  TECHNIQUE: Multidetector CT imaging of the chest, abdomen and pelvis was performed following the standard protocol during bolus administration of intravenous contrast.  CONTRAST:  122mL OMNIPAQUE IOHEXOL 300 MG/ML  SOLN  COMPARISON:  06/07/2013  RECIST 1.1 Target Lesions:  1. Right lower lobe mass measures 8.1 cm, image 35/series 2. Previously 8.7 cm.  FINDINGS:   CT CHEST FINDINGS  There is a moderate volume right pleural effusion which appears loculated. This is similar in volume to the previous exam. Lung mass in the right lower lobe encasing the right hilar structures measures 8.1 cm, image 35/series 2. Previously 8.7 cm. The peripheral nodular densities within the superior segment of left lower lobe are likely postinflammatory, image 26/series 4. This is unchanged from previous exam. Adjacent nodule measures 3 mm, image 25/series 4 and is unchanged from previous exam. Sub  solid nodule within the superior segment of left lower lobe measures 6 mm, image 20/ series 4. Previously 5 mm. Anterior right upper lobe nodule measures 5 mm, image 18/series 4. This is stable from previous exam. No new or progressive nodularity identified.  Normal heart size. Small pericardial effusion identified. There is no enlarged mediastinal or or left hilar lymph nodes. No enlarged axillary or supraclavicular lymph nodes. Low attenuation nodule in the left lobe of thyroid gland measures 2.6 cm, image 9/series 2. This is unchanged from previous exam.  Review of the visualized bony structures is significant for mild multi level degenerative disc disease.    CT ABDOMEN AND PELVIS FINDINGS  No focal liver abnormality identified. The stress set there are several small stones noted within the neck of the gallbladder. No biliary dilatation. Normal appearance of the pancreas. The spleen is unremarkable.  The adrenal glands are both normal. Bilateral renal cysts are noted. Within the inferior pole of the left kidney there is a nonobstructing stone measuring 5 mm, image 74/series 2.  Postoperative change from abdominal aortic aneurysm repair noted. No enlarged upper abdominal lymph nodes identified.  The stomach is normal. The small bowel loops have a normal course and caliber without obstruction. Visualized portions of the colon are within normal limits.  Review of the visualized osseous structures is significant for mild spondylosis within the thoracic and lumbar spine. No aggressive lytic or sclerotic bone lesions identified.    IMPRESSION: 1. Mild decrease in size of right lower lobe lung mass. 2. No change in right pleural effusion. 3. Small nonspecific pulmonary nodules in the left lung are unchanged from previous exam. 4. No evidence for metastatic disease within the upper abdomen.   Electronically Signed   By: Kerby Moors M.D.   On: 08/09/2013 13:24   ASSESSMENT AND PLAN: This is a very pleasant 80  years old white male with metastatic non-small cell lung cancer, squamous cell carcinoma that was initially diagnosed as stage IIIA. He is status post several chemotherapy regimens most recently with Tarceva but has evidence for disease progression. He is currently undergoing immunotherapy with Nivolumab and tolerating his treatment fairly well status post 5 cycles. His recent CT scan of the chest, abdomen and pelvis showed mild decrease in the size of the right lower lobe mass with a stable disease in the abdomen and pelvis. Patient was discussed with and also seen by Dr. Julien Nordmann. His labs were reviewed and he will proceed with cycle #6 of his  immunotherapy with Nivolumab as scheduled today.  He return for a follow up visit in 2 weeks for reevaluation and management any adverse effect of his treatment.  The patient voices understanding of current disease status and treatment options and is in agreement with the current care plan.  All questions were answered. The patient knows to call the clinic with any problems, questions or concerns. We can certainly see the patient much sooner if necessary.  Carlton Adam, PA-C  ADDENDUM:  Hematology/Oncology Attending:  I had a face to face encounter with the patient. I recommended his care plan. This is a very pleasant 80 years old white male with metastatic non-small cell lung cancer, squamous cell carcinoma currently undergoing immunotherapy treatment with Nivolumab according to the BMS clinical trial. He is Status post 5 cycles. The patient is feeling much better these days and tolerating the treatment fairly well. I recommended for him to proceed with cycle #6 today as scheduled. He would come back for follow up visit in 2 weeks with the next cycle of his treatment. He was advised to call immediately if he has any concerning symptoms in the interval.  Disclaimer: This note was dictated with voice recognition software. Similar sounding words can  inadvertently be transcribed and may not be corrected upon review. Curt Bears, MD 08/25/2013

## 2013-09-08 ENCOUNTER — Ambulatory Visit (HOSPITAL_BASED_OUTPATIENT_CLINIC_OR_DEPARTMENT_OTHER): Payer: Medicare Other

## 2013-09-08 ENCOUNTER — Encounter: Payer: Self-pay | Admitting: Internal Medicine

## 2013-09-08 ENCOUNTER — Encounter: Payer: Self-pay | Admitting: *Deleted

## 2013-09-08 ENCOUNTER — Ambulatory Visit (HOSPITAL_BASED_OUTPATIENT_CLINIC_OR_DEPARTMENT_OTHER): Payer: Medicare Other | Admitting: Internal Medicine

## 2013-09-08 VITALS — BP 108/61 | HR 79 | Temp 97.8°F | Resp 18 | Ht 69.0 in | Wt 184.8 lb

## 2013-09-08 DIAGNOSIS — C3430 Malignant neoplasm of lower lobe, unspecified bronchus or lung: Secondary | ICD-10-CM

## 2013-09-08 DIAGNOSIS — Z5112 Encounter for antineoplastic immunotherapy: Secondary | ICD-10-CM

## 2013-09-08 DIAGNOSIS — C3490 Malignant neoplasm of unspecified part of unspecified bronchus or lung: Secondary | ICD-10-CM

## 2013-09-08 DIAGNOSIS — F3289 Other specified depressive episodes: Secondary | ICD-10-CM

## 2013-09-08 DIAGNOSIS — F329 Major depressive disorder, single episode, unspecified: Secondary | ICD-10-CM

## 2013-09-08 LAB — COMPREHENSIVE METABOLIC PANEL (CC13)
ALK PHOS: 60 U/L (ref 40–150)
ALT: 14 U/L (ref 0–55)
ANION GAP: 11 meq/L (ref 3–11)
AST: 10 U/L (ref 5–34)
Albumin: 2.9 g/dL — ABNORMAL LOW (ref 3.5–5.0)
BUN: 19.9 mg/dL (ref 7.0–26.0)
CO2: 29 meq/L (ref 22–29)
CREATININE: 1.4 mg/dL — AB (ref 0.7–1.3)
Calcium: 9.2 mg/dL (ref 8.4–10.4)
Chloride: 104 mEq/L (ref 98–109)
Glucose: 135 mg/dl (ref 70–140)
Potassium: 4 mEq/L (ref 3.5–5.1)
Sodium: 143 mEq/L (ref 136–145)
TOTAL PROTEIN: 6.1 g/dL — AB (ref 6.4–8.3)
Total Bilirubin: 0.34 mg/dL (ref 0.20–1.20)

## 2013-09-08 LAB — CBC WITH DIFFERENTIAL/PLATELET
BASO%: 1.1 % (ref 0.0–2.0)
Basophils Absolute: 0.1 10*3/uL (ref 0.0–0.1)
EOS%: 6.1 % (ref 0.0–7.0)
Eosinophils Absolute: 0.5 10*3/uL (ref 0.0–0.5)
HEMATOCRIT: 34.9 % — AB (ref 38.4–49.9)
HGB: 10.7 g/dL — ABNORMAL LOW (ref 13.0–17.1)
LYMPH%: 5.6 % — AB (ref 14.0–49.0)
MCH: 25.9 pg — AB (ref 27.2–33.4)
MCHC: 30.7 g/dL — AB (ref 32.0–36.0)
MCV: 84.4 fL (ref 79.3–98.0)
MONO#: 0.8 10*3/uL (ref 0.1–0.9)
MONO%: 9.3 % (ref 0.0–14.0)
NEUT#: 6.7 10*3/uL — ABNORMAL HIGH (ref 1.5–6.5)
NEUT%: 77.9 % — ABNORMAL HIGH (ref 39.0–75.0)
PLATELETS: 194 10*3/uL (ref 140–400)
RBC: 4.13 10*6/uL — AB (ref 4.20–5.82)
RDW: 15.9 % — ABNORMAL HIGH (ref 11.0–14.6)
WBC: 8.6 10*3/uL (ref 4.0–10.3)
lymph#: 0.5 10*3/uL — ABNORMAL LOW (ref 0.9–3.3)

## 2013-09-08 LAB — TSH CHCC: TSH: 0.923 m(IU)/L (ref 0.320–4.118)

## 2013-09-08 LAB — PHOSPHORUS: Phosphorus: 2.8 mg/dL (ref 2.3–4.6)

## 2013-09-08 LAB — LACTATE DEHYDROGENASE (CC13): LDH: 153 U/L (ref 125–245)

## 2013-09-08 LAB — MAGNESIUM (CC13): MAGNESIUM: 2.1 mg/dL (ref 1.5–2.5)

## 2013-09-08 MED ORDER — SODIUM CHLORIDE 0.9 % IV SOLN
3.0000 mg/kg | Freq: Once | INTRAVENOUS | Status: AC
  Administered 2013-09-08: 271 mg via INTRAVENOUS
  Filled 2013-09-08: qty 27.1

## 2013-09-08 MED ORDER — HEPARIN SOD (PORK) LOCK FLUSH 100 UNIT/ML IV SOLN
500.0000 [IU] | Freq: Once | INTRAVENOUS | Status: AC | PRN
  Administered 2013-09-08: 500 [IU]
  Filled 2013-09-08: qty 5

## 2013-09-08 MED ORDER — SODIUM CHLORIDE 0.9 % IV SOLN
Freq: Once | INTRAVENOUS | Status: AC
  Administered 2013-09-08: 11:00:00 via INTRAVENOUS

## 2013-09-08 MED ORDER — SODIUM CHLORIDE 0.9 % IJ SOLN
10.0000 mL | INTRAMUSCULAR | Status: DC | PRN
  Administered 2013-09-08: 10 mL
  Filled 2013-09-08: qty 10

## 2013-09-08 MED ORDER — MIRTAZAPINE 30 MG PO TABS: 30.0000 mg | ORAL_TABLET | Freq: Every day | ORAL | Status: DC

## 2013-09-08 MED ORDER — SODIUM CHLORIDE 0.9 % IJ SOLN
10.0000 mL | INTRAMUSCULAR | Status: DC | PRN
  Administered 2013-09-08: 10 mL via INTRAVENOUS
  Filled 2013-09-08: qty 10

## 2013-09-08 MED ORDER — HYDROCODONE-CHLORPHENIRAMINE 5-4 MG/5ML PO SOLN: 5.0000 mL | Freq: Two times a day (BID) | ORAL | Status: DC

## 2013-09-08 NOTE — Patient Instructions (Signed)

## 2013-09-08 NOTE — Progress Notes (Signed)
09/08/13 BMS [id number] Cycle 7 [name] [name] is in the cancer center this morning for lab work, physical exam, and treatment with nivolumab. He completed the PROs . Reviewed his current medication list. He saw his PCP last week and he decreased his losartan by 1/2 and his Lantus to 22 units at HS. He reports lack of appetite. He also reports some feelings of hopelessness in treating his cancer. After discussion with Dr. Julien Nordmann, the decision was made to discontinue celexa effective today. He will begin Remeron instead. Dr. Julien Nordmann told him this could possibly help his appetite as well as his depression.   [name] [name] is experiencing some swelling in his ankles so he began taking lasix on Sunday, 5/10.  Lab results were reviewed and found to be WNL for treatment. Sign for infusion given to Durenda Age, RN.

## 2013-09-08 NOTE — Patient Instructions (Signed)
[facility] Discharge Instructions for Patients Receiving Chemotherapy  Today you received the following chemotherapy agents nivolumab  To help prevent nausea and vomiting after your treatment, we encourage you to take your nausea medication as needed   If you develop nausea and vomiting that is not controlled by your nausea medication, call the clinic.   BELOW ARE SYMPTOMS THAT SHOULD BE REPORTED IMMEDIATELY:  *FEVER GREATER THAN 100.5 F  *CHILLS WITH OR WITHOUT FEVER  NAUSEA AND VOMITING THAT IS NOT CONTROLLED WITH YOUR NAUSEA MEDICATION  *UNUSUAL SHORTNESS OF BREATH  *UNUSUAL BRUISING OR BLEEDING  TENDERNESS IN MOUTH AND THROAT WITH OR WITHOUT PRESENCE OF ULCERS  *URINARY PROBLEMS  *BOWEL PROBLEMS  UNUSUAL RASH Items with * indicate a potential emergency and should be followed up as soon as possible.  Feel free to call the clinic you have any questions or concerns. The clinic phone number is [phone].

## 2013-09-08 NOTE — Progress Notes (Signed)
[facility] Telephone:(336) [phone]   Fax:(336) [phone]  OFFICE PROGRESS NOTE  [name], [name], MD [place] [address]  DIAGNOSIS: Metastatic non-small cell lung cancer, squamous cell carcinoma initially diagnosed as stage IIIa non-small cell lung cancer diagnosed in June of 2012.   PRIOR THERAPY: 1) status post a course of concurrent chemoradiation with weekly carboplatin and paclitaxel completed in September of 2012 under the care of Dr. Oliva Bustard. 2) the patient was followed by observation and in September of 2013 he had evidence for disease progression. 3) he was retreated again with a short course of concurrent chemoradiation with carboplatin and paclitaxel completed in November of 2013. 4) he was followed by observation until April 2014 when he was found to have evidence for disease progression. 5) He has a Soil scientist test good and the patient was started on treatment with Tarceva 150 mg by mouth daily for around 7 months discontinued on 05/02/2013 secondary to disease.   CURRENT THERAPY:immunotherapy according to the BMS clinical trial [place] 209153 with Nivolumab 3.0  MG/KG every 2 weeks, status post 6 cycles. First dose 06/16/2013.  INTERVAL HISTORY: [name] [name] 80 y.o. male returns to the clinic today for follow up visit accompanied by his wife and daughter.  He is feeling fine today except for depression.  He is currently on treatment with Celexa but no improvement in his condition. He was able to go shopping with his wife recently. He has mild fatigue. His cough has significantly improved. The patient is currently off diuretics with no significant swelling of his lower extremities or worsening dyspnea. He denied having any fever or chills. He has no significant weight loss or night sweats. He is here today to start cycle #7 of his immunotherapy.  MEDICAL HISTORY: Past Medical History  Diagnosis Date  . Lung cancer   . Hypercholesteremia   .  Hypertension   . Diabetes mellitus without complication   . AAA (abdominal aortic aneurysm)   . CAD (coronary artery disease)   . Skin cancer     ALLERGIES:  has No Known Allergies.  MEDICATIONS:  Current Outpatient Prescriptions  Medication Sig Dispense Refill  . ADVAIR DISKUS 250-50 MCG/DOSE AEPB Inhale 1 puff into the lungs. BID      . ALPRAZolam (XANAX) 0.25 MG tablet Take 1 tablet (0.25 mg total) by mouth 2 (two) times daily.  30 tablet  0  . benzonatate (TESSALON) 200 MG capsule Take 200 mg by mouth 3 (three) times daily as needed for cough.      . citalopram (CELEXA) 20 MG tablet Take 20 mg by mouth daily.      . fluticasone (FLONASE) 50 MCG/ACT nasal spray 1 spray. 1 spray in each nostril BID      . furosemide (LASIX) 20 MG tablet       . Hydrocodone-Chlorpheniramine 5-4 MG/5ML SOLN Take 5 mLs by mouth 2 (two) times daily.      . insulin aspart (NOVOLOG) 100 UNIT/ML injection Inject into the skin as directed. Check before each meal, take as directed per sliding scale      . ipratropium-albuterol (DUONEB) 0.5-2.5 (3) MG/3ML SOLN Take 3 mLs by nebulization every 8 (eight) hours as needed.      Marland Kitchen LANTUS SOLOSTAR 100 UNIT/ML Solostar Pen 22 Units. 22 units at bedtime      . losartan (COZAAR) 50 MG tablet Take 50 mg by mouth daily.      . metoprolol succinate (TOPROL-XL)  100 MG 24 hr tablet Take 100 mg by mouth daily.      Marland Kitchen nystatin (MYCOSTATIN) 100000 UNIT/ML suspension Take 5 mLs (500,000 Units total) by mouth 4 (four) times daily.  240 mL  0  . nystatin cream (MYCOSTATIN) 3 (three) times daily. Apply to affected area three times daily      . omeprazole (PRILOSEC) 40 MG capsule Take 1 capsule (40 mg total) by mouth daily.  30 capsule  2  . PROAIR HFA 108 (90 BASE) MCG/ACT inhaler Inhale 2 puffs into the lungs 4 (four) times daily as needed.      Marland Kitchen SPIRIVA HANDIHALER 18 MCG inhalation capsule Place into inhaler and inhale daily.      . simvastatin (ZOCOR) 20 MG tablet Take 20 mg by  mouth daily.       No current facility-administered medications for this visit.   Facility-Administered Medications Ordered in Other Visits  Medication Dose Route Frequency Provider Last Rate Last Dose  . sodium chloride 0.9 % injection 10 mL  10 mL Intravenous PRN Curt Bears, MD   10 mL at 09/08/13 6063    SURGICAL HISTORY:  Past Surgical History  Procedure Laterality Date  . Abdominal aortic aneurysm repair  2000  . Coronary stent placement  2000  . Tonsillectomy      when he was a child    REVIEW OF SYSTEMS:  Constitutional: positive for fatigue Eyes: negative Ears, nose, mouth, throat, and face: negative Respiratory: positive for dyspnea on exertion Cardiovascular: negative Gastrointestinal: negative Genitourinary:negative Integument/breast: negative Hematologic/lymphatic: negative Musculoskeletal:negative Neurological: negative Behavioral/Psych: positive for depression Endocrine: negative Allergic/Immunologic: negative   PHYSICAL EXAMINATION: General appearance: alert, cooperative, fatigued and no distress Head: Normocephalic, without obvious abnormality, atraumatic Neck: no adenopathy, no JVD, supple, symmetrical, trachea midline and thyroid not enlarged, symmetric, no tenderness/mass/nodules Lymph nodes: Cervical, supraclavicular, and axillary nodes normal. Resp: wheezes RLL and RML Back: symmetric, no curvature. ROM normal. No CVA tenderness. Cardio: regular rate and rhythm, S1, S2 normal, no murmur, click, rub or gallop GI: soft, non-tender; bowel sounds normal; no masses,  no organomegaly Extremities: extremities normal, atraumatic, no cyanosis or edema Neurologic: Alert and oriented X 3, normal strength and tone. Normal symmetric reflexes. Normal coordination and gait  ECOG PERFORMANCE STATUS: 1 - Symptomatic but completely ambulatory  Blood pressure 108/61, pulse 79, temperature 97.8 F (36.6 C), temperature source Oral, resp. rate 18, height 5\' 9"   (1.753 m), weight 184 lb 12.8 oz (83.825 kg), SpO2 98.00%.  LABORATORY DATA: Lab Results  Component Value Date   WBC 8.6 09/08/2013   HGB 10.7* 09/08/2013   HCT 34.9* 09/08/2013   MCV 84.4 09/08/2013   PLT 194 09/08/2013      Chemistry      Component Value Date/Time   NA 143 09/08/2013 0902   K 4.0 09/08/2013 0902   CO2 29 09/08/2013 0902   BUN 19.9 09/08/2013 0902   CREATININE 1.4* 09/08/2013 0902      Component Value Date/Time   CALCIUM 9.2 09/08/2013 0902   ALKPHOS 60 09/08/2013 0902   AST 10 09/08/2013 0902   ALT 14 09/08/2013 0902   BILITOT 0.34 09/08/2013 0902       RADIOGRAPHIC STUDIES:  ASSESSMENT AND PLAN: This is a very pleasant 80 years old white male with metastatic non-small cell lung cancer, squama cell carcinoma that was initially diagnosed as stage IIIA. He is status post several chemotherapy regimens most recently with Tarceva but has evidence for disease progression. He is  currently undergoing immunotherapy with Nivolumab and tolerating his treatment fairly well status post 6 cycles. I recommended for the patient to proceed with cycle #7 today as scheduled. For depression, I would discontinue the Celexa and the start the patient on Remeron 30 mg by mouth each bedtime. He would come back for follow up visit in 2 weeks for reevaluation and management any adverse effect of his treatment.  The patient voices understanding of current disease status and treatment options and is in agreement with the current care plan.  All questions were answered. The patient knows to call the clinic with any problems, questions or concerns. We can certainly see the patient much sooner if necessary.  Disclaimer: This note was dictated with voice recognition software. Similar sounding words can inadvertently be transcribed and may not be corrected upon review.

## 2013-09-09 ENCOUNTER — Other Ambulatory Visit: Payer: Self-pay | Admitting: Medical Oncology

## 2013-09-09 ENCOUNTER — Telehealth: Payer: Self-pay | Admitting: Internal Medicine

## 2013-09-09 NOTE — Telephone Encounter (Signed)
x

## 2013-09-09 NOTE — Telephone Encounter (Signed)
Pt pharmacy only has tussionex 8-4mg  /5 ml ( not 5-4 mg/5 ml ) so per Adrena I told pharmacist to fill 10.4 mg/5 ml and to give 2.5 ml bid and 1/2 volume.

## 2013-09-12 ENCOUNTER — Telehealth: Payer: Self-pay | Admitting: Internal Medicine

## 2013-09-12 ENCOUNTER — Telehealth: Payer: Self-pay | Admitting: *Deleted

## 2013-09-12 NOTE — Telephone Encounter (Signed)
Per staff message and POF I have scheduled appts.  JMW  

## 2013-09-12 NOTE — Telephone Encounter (Signed)
Pt is aware to pick up his June schedule at his next appt.

## 2013-09-22 ENCOUNTER — Encounter: Payer: Self-pay | Admitting: *Deleted

## 2013-09-22 ENCOUNTER — Ambulatory Visit (HOSPITAL_BASED_OUTPATIENT_CLINIC_OR_DEPARTMENT_OTHER): Payer: Medicare Other

## 2013-09-22 ENCOUNTER — Ambulatory Visit (HOSPITAL_BASED_OUTPATIENT_CLINIC_OR_DEPARTMENT_OTHER): Payer: Medicare Other | Admitting: *Deleted

## 2013-09-22 ENCOUNTER — Ambulatory Visit (HOSPITAL_BASED_OUTPATIENT_CLINIC_OR_DEPARTMENT_OTHER): Payer: Medicare Other | Admitting: Physician Assistant

## 2013-09-22 ENCOUNTER — Encounter: Payer: Self-pay | Admitting: Physician Assistant

## 2013-09-22 VITALS — BP 118/69 | HR 67 | Temp 98.6°F | Resp 18 | Ht 69.0 in | Wt 183.9 lb

## 2013-09-22 DIAGNOSIS — C3490 Malignant neoplasm of unspecified part of unspecified bronchus or lung: Secondary | ICD-10-CM

## 2013-09-22 DIAGNOSIS — F3289 Other specified depressive episodes: Secondary | ICD-10-CM

## 2013-09-22 DIAGNOSIS — C3430 Malignant neoplasm of lower lobe, unspecified bronchus or lung: Secondary | ICD-10-CM

## 2013-09-22 DIAGNOSIS — B370 Candidal stomatitis: Secondary | ICD-10-CM

## 2013-09-22 DIAGNOSIS — F329 Major depressive disorder, single episode, unspecified: Secondary | ICD-10-CM

## 2013-09-22 DIAGNOSIS — Z5112 Encounter for antineoplastic immunotherapy: Secondary | ICD-10-CM

## 2013-09-22 LAB — CBC WITH DIFFERENTIAL/PLATELET
BASO%: 0.6 % (ref 0.0–2.0)
Basophils Absolute: 0 10*3/uL (ref 0.0–0.1)
EOS%: 7.2 % — ABNORMAL HIGH (ref 0.0–7.0)
Eosinophils Absolute: 0.6 10*3/uL — ABNORMAL HIGH (ref 0.0–0.5)
HEMATOCRIT: 36.5 % — AB (ref 38.4–49.9)
HGB: 11 g/dL — ABNORMAL LOW (ref 13.0–17.1)
LYMPH%: 5.9 % — ABNORMAL LOW (ref 14.0–49.0)
MCH: 25.5 pg — ABNORMAL LOW (ref 27.2–33.4)
MCHC: 30.2 g/dL — ABNORMAL LOW (ref 32.0–36.0)
MCV: 84.6 fL (ref 79.3–98.0)
MONO#: 0.8 10*3/uL (ref 0.1–0.9)
MONO%: 9.5 % (ref 0.0–14.0)
NEUT%: 76.8 % — AB (ref 39.0–75.0)
NEUTROS ABS: 6.2 10*3/uL (ref 1.5–6.5)
Platelets: 188 10*3/uL (ref 140–400)
RBC: 4.32 10*6/uL (ref 4.20–5.82)
RDW: 16.6 % — ABNORMAL HIGH (ref 11.0–14.6)
WBC: 8 10*3/uL (ref 4.0–10.3)
lymph#: 0.5 10*3/uL — ABNORMAL LOW (ref 0.9–3.3)

## 2013-09-22 LAB — COMPREHENSIVE METABOLIC PANEL (CC13)
ALT: 16 U/L (ref 0–55)
ANION GAP: 10 meq/L (ref 3–11)
AST: 14 U/L (ref 5–34)
Albumin: 3.1 g/dL — ABNORMAL LOW (ref 3.5–5.0)
Alkaline Phosphatase: 67 U/L (ref 40–150)
BUN: 20.9 mg/dL (ref 7.0–26.0)
CO2: 29 meq/L (ref 22–29)
CREATININE: 1.4 mg/dL — AB (ref 0.7–1.3)
Calcium: 9.3 mg/dL (ref 8.4–10.4)
Chloride: 105 mEq/L (ref 98–109)
Glucose: 81 mg/dl (ref 70–140)
POTASSIUM: 4.2 meq/L (ref 3.5–5.1)
Sodium: 145 mEq/L (ref 136–145)
Total Bilirubin: 0.38 mg/dL (ref 0.20–1.20)
Total Protein: 6.3 g/dL — ABNORMAL LOW (ref 6.4–8.3)

## 2013-09-22 LAB — LACTATE DEHYDROGENASE (CC13): LDH: 164 U/L (ref 125–245)

## 2013-09-22 LAB — PHOSPHORUS: Phosphorus: 3.2 mg/dL (ref 2.3–4.6)

## 2013-09-22 LAB — RESEARCH LABS

## 2013-09-22 LAB — MAGNESIUM (CC13): Magnesium: 2.1 mg/dl (ref 1.5–2.5)

## 2013-09-22 MED ORDER — ALTEPLASE 2 MG IJ SOLR
2.0000 mg | Freq: Once | INTRAMUSCULAR | Status: DC | PRN
Start: 2013-09-22 — End: 2013-09-22
  Filled 2013-09-22: qty 2

## 2013-09-22 MED ORDER — ALPRAZOLAM 0.25 MG PO TABS: 0.2500 mg | ORAL_TABLET | Freq: Two times a day (BID) | ORAL | Status: DC

## 2013-09-22 MED ORDER — HEPARIN SOD (PORK) LOCK FLUSH 100 UNIT/ML IV SOLN
250.0000 [IU] | Freq: Once | INTRAVENOUS | Status: DC | PRN
  Filled 2013-09-22: qty 5

## 2013-09-22 MED ORDER — SODIUM CHLORIDE 0.9 % IJ SOLN
3.0000 mL | INTRAMUSCULAR | Status: DC | PRN
  Filled 2013-09-22: qty 10

## 2013-09-22 MED ORDER — HEPARIN SOD (PORK) LOCK FLUSH 100 UNIT/ML IV SOLN
500.0000 [IU] | Freq: Once | INTRAVENOUS | Status: AC | PRN
  Administered 2013-09-22: 500 [IU]
  Filled 2013-09-22: qty 5

## 2013-09-22 MED ORDER — NYSTATIN 100000 UNIT/ML MT SUSP: 5.0000 mL | Freq: Four times a day (QID) | OROMUCOSAL | Status: DC

## 2013-09-22 MED ORDER — SODIUM CHLORIDE 0.9 % IV SOLN
Freq: Once | INTRAVENOUS | Status: AC
  Administered 2013-09-22: 14:00:00 via INTRAVENOUS

## 2013-09-22 MED ORDER — SODIUM CHLORIDE 0.9 % IJ SOLN
10.0000 mL | INTRAMUSCULAR | Status: DC | PRN
  Administered 2013-09-22 (×2): 10 mL via INTRAVENOUS
  Filled 2013-09-22: qty 10

## 2013-09-22 MED ORDER — SODIUM CHLORIDE 0.9 % IJ SOLN
10.0000 mL | INTRAMUSCULAR | Status: DC | PRN
  Filled 2013-09-22: qty 10

## 2013-09-22 MED ORDER — SODIUM CHLORIDE 0.9 % IV SOLN
3.0000 mg/kg | Freq: Once | INTRAVENOUS | Status: AC
  Administered 2013-09-22: 271 mg via INTRAVENOUS
  Filled 2013-09-22: qty 27.1

## 2013-09-22 NOTE — Progress Notes (Signed)
09/22/13 @ 1:15 pm, BMS [id number], Cycle 8:  [name] [name] into the [facility] for labs, to see [name] Metro, PA and [name] [name], then receive treatment with nivolumab.  His labs were all appropriate to proceed with treatment.  He seems more engaged and talkative this week.  He wife reports that he has been out shopping and to dinner several times over the last two weeks.  He reports that his GERD has improved a great deal since starting omeprozole. He has a little better appetite and has gained a pound.  He reports the itchy, wet area in his groin that [name] [name] referenced in her last note is long-standing problem, probably a skin yeast infection, that waxes and wanes and the Nystatin powder has helped.  He thinks he might be breathing a little better.  He has been taking his Lasix for a couple of weeks now, but still has some minimal edema in his LE. Since his serum creatinine is at baseline and he is drinking well, he will continue the Lasix a few more days. Obtained kit assignment through the [facility], and gave that to the pharmacy.  [name] Sports, RN and [name] Simmering, RN will be treating [name] [name] today.  Discussed the duration of the drug and the requirement of a 0.22 micron filter with [name] [name] and provided her with a sign for the pump  09/30/13 _0 :30am, Telephone Call from Daughter:  I received a telephone call from [name] [name]'s daughter, [name] [name].  She said that her father has decided he would like to move his care back to [place].  He can obtain nivolumab off study now.  He would like to keep his appointment for CT scans, which is scheduled for 10/04/13 and to receive his next treatment here at the [facility] on 10/06/13, then to withdraw from the study and resume care with [name] [name] at [facility].  Informed [name] [name] of same.

## 2013-09-22 NOTE — Patient Instructions (Signed)

## 2013-09-22 NOTE — Patient Instructions (Signed)
[facility] Discharge Instructions for Patients Receiving Chemotherapy  Today you received the following chemotherapy agents Nivolumab.  To help prevent nausea and vomiting after your treatment, we encourage you to take your nausea medication as directed.    If you develop nausea and vomiting that is not controlled by your nausea medication, call the clinic.   BELOW ARE SYMPTOMS THAT SHOULD BE REPORTED IMMEDIATELY:  *FEVER GREATER THAN 100.5 F  *CHILLS WITH OR WITHOUT FEVER  NAUSEA AND VOMITING THAT IS NOT CONTROLLED WITH YOUR NAUSEA MEDICATION  *UNUSUAL SHORTNESS OF BREATH  *UNUSUAL BRUISING OR BLEEDING  TENDERNESS IN MOUTH AND THROAT WITH OR WITHOUT PRESENCE OF ULCERS  *URINARY PROBLEMS  *BOWEL PROBLEMS  UNUSUAL RASH Items with * indicate a potential emergency and should be followed up as soon as possible.  Feel free to call the clinic you have any questions or concerns. The clinic phone number is [phone].

## 2013-09-22 NOTE — Progress Notes (Addendum)
[facility] [phone]   [phone]  SHARED VISIT PROGRESS NOTE  [name], [name], [name]  DIAGNOSIS: Metastatic non-small cell lung cancer, squamous cell carcinoma initially diagnosed as stage IIIa non-small cell lung cancer diagnosed in June of 2012.   PRIOR THERAPY: 1) status post a course of concurrent chemoradiation with weekly carboplatin and paclitaxel completed in September of 2012 under the care of Dr. Oliva Bustard. 2) the patient was followed by observation and in September of 2013 he had evidence for disease progression. 3) he was retreated again with a short course of concurrent chemoradiation with carboplatin and paclitaxel completed in November of 2013. 4) he was followed by observation until April 2014 when he was found to have evidence for disease progression. 5) He has a Soil scientist test good and the patient was started on treatment with Tarceva 150 mg by mouth daily for around 7 months discontinued on 05/02/2013 secondary to disease.   CURRENT THERAPY:immunotherapy according to the [facility] clinical trial [place] [id number] with Nivolumab 3.0  MG/KG every 2 weeks, status post 7 cycles. First dose 06/16/2013.  INTERVAL HISTORY: [name] [name] 80 y.o. male returns to the clinic today for follow up visit accompanied by his wife and daughter. Overall he is feeling well. His mood has improved. He does complain of a bad taste in his mouth recently. He continues to have some shortness of breath with exertion but feels that it may be slightly improved. He denied any issues with skin rash or diarrhea. He continues to tolerate his treatment with Nivolumab without difficulty. He does continue to have some intermittent lower extremity edema that is intermittently treated with Lasix with good results.   He has mild fatigue. His cough has significantly improved. The patient is currently off diuretics with no significant swelling of his lower  extremities or worsening dyspnea. He denied having any fever or chills. He has no significant weight loss or night sweats. He is here today to start cycle #8 of his immunotherapy.  MEDICAL HISTORY: Past Medical History  Diagnosis Date  . Lung cancer   . Hypercholesteremia   . Hypertension   . Diabetes mellitus without complication   . AAA (abdominal aortic aneurysm)   . CAD (coronary artery disease)   . Skin cancer     ALLERGIES:  has No Known Allergies.  MEDICATIONS:  Current Outpatient Prescriptions  Medication Sig Dispense Refill  . ADVAIR DISKUS 250-50 MCG/DOSE AEPB Inhale 1 puff into the lungs. BID      . ALPRAZolam (XANAX) 0.25 MG tablet Take 1 tablet (0.25 mg total) by mouth 2 (two) times daily.  30 tablet  0  . benzonatate (TESSALON) 200 MG capsule Take 200 mg by mouth 3 (three) times daily as needed for cough.      . fluticasone (FLONASE) 50 MCG/ACT nasal spray 1 spray. 1 spray in each nostril BID      . furosemide (LASIX) 20 MG tablet       . Hydrocodone-Chlorpheniramine 5-4 MG/5ML SOLN Take 5 mLs by mouth 2 (two) times daily.  240 mL  0  . insulin aspart (NOVOLOG) 100 UNIT/ML injection Inject into the skin as directed. Check before each meal, take as directed per sliding scale      . ipratropium-albuterol (DUONEB) 0.5-2.5 (3) MG/3ML SOLN Take 3 mLs by nebulization every 8 (eight) hours as needed.      Marland Kitchen LANTUS SOLOSTAR 100 UNIT/ML Solostar Pen 22 Units.  22 units at bedtime      . losartan (COZAAR) 50 MG tablet Take 50 mg by mouth daily.      . metoprolol succinate (TOPROL-XL) 100 MG 24 hr tablet Take 100 mg by mouth daily.      . mirtazapine (REMERON) 30 MG tablet Take 1 tablet (30 mg total) by mouth at bedtime.  30 tablet  2  . nystatin cream (MYCOSTATIN) 3 (three) times daily. Apply to affected area three times daily      . omeprazole (PRILOSEC) 40 MG capsule Take 1 capsule (40 mg total) by mouth daily.  30 capsule  2  . PROAIR HFA 108 (90 BASE) MCG/ACT inhaler Inhale 2  puffs into the lungs 4 (four) times daily as needed.      . simvastatin (ZOCOR) 20 MG tablet Take 20 mg by mouth daily.      Marland Kitchen nystatin (MYCOSTATIN) 100000 UNIT/ML suspension Take 5 mLs (500,000 Units total) by mouth 4 (four) times daily.  240 mL  0   No current facility-administered medications for this visit.   Facility-Administered Medications Ordered in Other Visits  Medication Dose Route Frequency Provider Last Rate Last Dose  . sodium chloride 0.9 % injection 10 mL  10 mL Intravenous PRN Curt Bears, MD   10 mL at 09/08/13 5852    SURGICAL HISTORY:  Past Surgical History  Procedure Laterality Date  . Abdominal aortic aneurysm repair  2000  . Coronary stent placement  2000  . Tonsillectomy      when he was a child    REVIEW OF SYSTEMS:  Constitutional: positive for fatigue Eyes: negative Ears, nose, mouth, throat, and face: negative Respiratory: positive for dyspnea on exertion Cardiovascular: negative Gastrointestinal: negative Genitourinary:negative Integument/breast: negative Hematologic/lymphatic: negative Musculoskeletal:negative Neurological: negative Behavioral/Psych: positive for depression Endocrine: negative Allergic/Immunologic: negative   PHYSICAL EXAMINATION: General appearance: alert, cooperative, fatigued and no distress Head: Normocephalic, without obvious abnormality, atraumatic Neck: no adenopathy, no JVD, supple, symmetrical, trachea midline and thyroid not enlarged, symmetric, no tenderness/mass/nodules Lymph nodes: Cervical, supraclavicular, and axillary nodes normal. Resp: wheezes RLL and RML Back: symmetric, no curvature. ROM normal. No CVA tenderness. Cardio: regular rate and rhythm, S1, S2 normal, no murmur, click, rub or gallop GI: soft, non-tender; bowel sounds normal; no masses,  no organomegaly Extremities: extremities normal, atraumatic, no cyanosis or edema Neurologic: Alert and oriented X 3, normal strength and tone. Normal  symmetric reflexes. Normal coordination and gait  ECOG PERFORMANCE STATUS: 1 - Symptomatic but completely ambulatory  Blood pressure 118/69, pulse 67, temperature 98.6 F (37 C), temperature source Oral, resp. rate 18, height 5\' 9"  (1.753 m), weight 183 lb 14.4 oz (83.416 kg), SpO2 100.00%.  LABORATORY DATA: Lab Results  Component Value Date   WBC 8.0 09/22/2013   HGB 11.0* 09/22/2013   HCT 36.5* 09/22/2013   MCV 84.6 09/22/2013   PLT 188 09/22/2013      Chemistry      Component Value Date/Time   NA 145 09/22/2013 1139   K 4.2 09/22/2013 1139   CO2 29 09/22/2013 1139   BUN 20.9 09/22/2013 1139   CREATININE 1.4* 09/22/2013 1139      Component Value Date/Time   CALCIUM 9.3 09/22/2013 1139   ALKPHOS 67 09/22/2013 1139   AST 14 09/22/2013 1139   ALT 16 09/22/2013 1139   BILITOT 0.38 09/22/2013 1139       RADIOGRAPHIC STUDIES:  ASSESSMENT AND PLAN: This is a very pleasant 80 years old white male with  metastatic non-small cell lung cancer, squama cell carcinoma that was initially diagnosed as stage IIIA. He is status post several chemotherapy regimens most recently with Tarceva but has evidence for disease progression. He is currently undergoing immunotherapy with Nivolumab and tolerating his treatment fairly well status post 7 cycles. Patient was discussed with also seen by Dr. Julien Nordmann. For the oral candidiasis a prescription for nystatin suspension was sent to his pharmacy of record via E. scribed. He will proceed with cycle #8 of his immunotherapy today as scheduled. He will followup in 2 weeks with CT scans as per protocol. For depression, he will continue on Remeron 30 mg by mouth each bedtime.  The patient voices understanding of current disease status and treatment options and is in agreement with the current care plan.  All questions were answered. The patient knows to call the clinic with any problems, questions or concerns. We can certainly see the patient much sooner if  necessary.  Carlton Adam , PA-C  ADDENDUM: Hematology/Oncology Attending:  I had a face to face encounter with the patient. I recommended his care plan. This is a very pleasant 80 years old white male with metastatic non-small cell lung cancer, squamous cell carcinoma currently undergoing immunotherapy with Nivolumab status post 7 cycles. The patient is feeling fine with no specific complaints. He is tolerating his treatment fairly well. We'll proceed with cycle #8 today as scheduled. He would have repeat CT scan of the chest for reevaluation of his disease before his upcoming visit in 2 weeks. The patient will continue Remeron for depression. He was advised to call immediately if he has any concerning symptoms and interval.  Disclaimer: This note was dictated with voice recognition software. Similar sounding words can inadvertently be transcribed and may not be corrected upon review. Curt Bears, MD 09/27/2013

## 2013-09-22 NOTE — Patient Instructions (Addendum)

## 2013-09-26 ENCOUNTER — Ambulatory Visit: Payer: Self-pay | Admitting: Oncology

## 2013-09-26 ENCOUNTER — Telehealth: Payer: Self-pay | Admitting: *Deleted

## 2013-09-26 NOTE — Telephone Encounter (Signed)
per email I have adjusted appt for 6/25

## 2013-10-04 ENCOUNTER — Ambulatory Visit (HOSPITAL_COMMUNITY)
Admission: RE | Admit: 2013-10-04 | Discharge: 2013-10-04 | Disposition: A | Discharge status: Home / Self Care | Payer: Medicare Other | Source: Ambulatory Visit | Attending: Internal Medicine | Admitting: Internal Medicine

## 2013-10-04 ENCOUNTER — Encounter (HOSPITAL_COMMUNITY): Payer: Self-pay

## 2013-10-04 DIAGNOSIS — J90 Pleural effusion, not elsewhere classified: Secondary | ICD-10-CM | POA: Insufficient documentation

## 2013-10-04 DIAGNOSIS — N281 Cyst of kidney, acquired: Secondary | ICD-10-CM | POA: Insufficient documentation

## 2013-10-04 DIAGNOSIS — I700 Atherosclerosis of aorta: Secondary | ICD-10-CM | POA: Insufficient documentation

## 2013-10-04 DIAGNOSIS — Z9221 Personal history of antineoplastic chemotherapy: Secondary | ICD-10-CM | POA: Insufficient documentation

## 2013-10-04 DIAGNOSIS — M47817 Spondylosis without myelopathy or radiculopathy, lumbosacral region: Secondary | ICD-10-CM | POA: Insufficient documentation

## 2013-10-04 DIAGNOSIS — N62 Hypertrophy of breast: Secondary | ICD-10-CM | POA: Insufficient documentation

## 2013-10-04 DIAGNOSIS — E041 Nontoxic single thyroid nodule: Secondary | ICD-10-CM | POA: Insufficient documentation

## 2013-10-04 DIAGNOSIS — C3490 Malignant neoplasm of unspecified part of unspecified bronchus or lung: Secondary | ICD-10-CM | POA: Insufficient documentation

## 2013-10-04 DIAGNOSIS — N200 Calculus of kidney: Secondary | ICD-10-CM | POA: Insufficient documentation

## 2013-10-04 DIAGNOSIS — Z923 Personal history of irradiation: Secondary | ICD-10-CM | POA: Insufficient documentation

## 2013-10-04 DIAGNOSIS — J9819 Other pulmonary collapse: Secondary | ICD-10-CM | POA: Insufficient documentation

## 2013-10-04 DIAGNOSIS — M47814 Spondylosis without myelopathy or radiculopathy, thoracic region: Secondary | ICD-10-CM | POA: Insufficient documentation

## 2013-10-04 MED ORDER — IOHEXOL 300 MG/ML  SOLN
100.0000 mL | Freq: Once | INTRAMUSCULAR | Status: AC | PRN
  Administered 2013-10-04: 100 mL via INTRAVENOUS

## 2013-10-05 ENCOUNTER — Telehealth: Payer: Self-pay | Admitting: *Deleted

## 2013-10-05 NOTE — Telephone Encounter (Signed)
Spoke with [facility].  They need new readings done at an office visit to give pt any kind of portable tank.  Will check levels at office visit tomorrow.  Spoke with wife, she verbalized understanding.  Advised that they ask for a tank when he arrives at [facility] if he needs it.  SLJ

## 2013-10-05 NOTE — Telephone Encounter (Signed)
Spoke with pt's wife.  Pt has been c/o increased SOB.  His O2 saturation on RA is 77-88%.  He uses oxygen at night.  She put the O2 on him and his O2 saturation increased to 92%.  Per Dr Vista Mink, pt needs to keep the O2 on continiously but if his SOB comes back or gets worse while on O2 he needs to go to the ED.  She verbalized understanding.  She also states that pt has the O2 machine upstairs and neither one of them can move it downstairs so he has to remain upstairs will on O2.  Will call their home health services [facility] who provides O2 to see if there can be a smaller tank available he can have that can moved around with him.

## 2013-10-06 ENCOUNTER — Ambulatory Visit: Payer: Medicare Other

## 2013-10-06 ENCOUNTER — Ambulatory Visit (HOSPITAL_BASED_OUTPATIENT_CLINIC_OR_DEPARTMENT_OTHER): Payer: Medicare Other | Admitting: Internal Medicine

## 2013-10-06 ENCOUNTER — Telehealth: Payer: Self-pay | Admitting: *Deleted

## 2013-10-06 ENCOUNTER — Encounter: Payer: Self-pay | Admitting: *Deleted

## 2013-10-06 ENCOUNTER — Ambulatory Visit (HOSPITAL_BASED_OUTPATIENT_CLINIC_OR_DEPARTMENT_OTHER): Payer: Medicare Other

## 2013-10-06 ENCOUNTER — Encounter: Payer: Self-pay | Admitting: Internal Medicine

## 2013-10-06 ENCOUNTER — Other Ambulatory Visit (HOSPITAL_BASED_OUTPATIENT_CLINIC_OR_DEPARTMENT_OTHER): Payer: Medicare Other

## 2013-10-06 VITALS — BP 126/64 | HR 92 | Temp 99.0°F | Resp 19 | Ht 69.0 in | Wt 189.4 lb

## 2013-10-06 DIAGNOSIS — Z5112 Encounter for antineoplastic immunotherapy: Secondary | ICD-10-CM

## 2013-10-06 DIAGNOSIS — C3430 Malignant neoplasm of lower lobe, unspecified bronchus or lung: Secondary | ICD-10-CM

## 2013-10-06 DIAGNOSIS — F29 Unspecified psychosis not due to a substance or known physiological condition: Secondary | ICD-10-CM

## 2013-10-06 DIAGNOSIS — R5383 Other fatigue: Secondary | ICD-10-CM

## 2013-10-06 DIAGNOSIS — R0602 Shortness of breath: Secondary | ICD-10-CM

## 2013-10-06 DIAGNOSIS — R5381 Other malaise: Secondary | ICD-10-CM

## 2013-10-06 DIAGNOSIS — C3490 Malignant neoplasm of unspecified part of unspecified bronchus or lung: Secondary | ICD-10-CM

## 2013-10-06 DIAGNOSIS — R609 Edema, unspecified: Secondary | ICD-10-CM

## 2013-10-06 DIAGNOSIS — Z95828 Presence of other vascular implants and grafts: Secondary | ICD-10-CM

## 2013-10-06 LAB — COMPREHENSIVE METABOLIC PANEL (CC13)
ALT: 15 U/L (ref 0–55)
AST: 13 U/L (ref 5–34)
Albumin: 3.1 g/dL — ABNORMAL LOW (ref 3.5–5.0)
Alkaline Phosphatase: 71 U/L (ref 40–150)
Anion Gap: 10 mEq/L (ref 3–11)
BILIRUBIN TOTAL: 0.46 mg/dL (ref 0.20–1.20)
BUN: 16.6 mg/dL (ref 7.0–26.0)
CO2: 35 meq/L — AB (ref 22–29)
Calcium: 9 mg/dL (ref 8.4–10.4)
Chloride: 100 mEq/L (ref 98–109)
Creatinine: 1.2 mg/dL (ref 0.7–1.3)
GLUCOSE: 126 mg/dL (ref 70–140)
Potassium: 3.9 mEq/L (ref 3.5–5.1)
Sodium: 144 mEq/L (ref 136–145)
TOTAL PROTEIN: 6.2 g/dL — AB (ref 6.4–8.3)

## 2013-10-06 LAB — CBC WITH DIFFERENTIAL/PLATELET
BASO%: 0.3 % (ref 0.0–2.0)
Basophils Absolute: 0 10*3/uL (ref 0.0–0.1)
EOS ABS: 0.3 10*3/uL (ref 0.0–0.5)
EOS%: 3.2 % (ref 0.0–7.0)
HEMATOCRIT: 34.5 % — AB (ref 38.4–49.9)
HGB: 10.6 g/dL — ABNORMAL LOW (ref 13.0–17.1)
LYMPH#: 0.3 10*3/uL — AB (ref 0.9–3.3)
LYMPH%: 3.1 % — AB (ref 14.0–49.0)
MCH: 26 pg — ABNORMAL LOW (ref 27.2–33.4)
MCHC: 30.7 g/dL — ABNORMAL LOW (ref 32.0–36.0)
MCV: 84.8 fL (ref 79.3–98.0)
MONO#: 0.9 10*3/uL (ref 0.1–0.9)
MONO%: 8.1 % (ref 0.0–14.0)
NEUT%: 85.3 % — ABNORMAL HIGH (ref 39.0–75.0)
NEUTROS ABS: 9.3 10*3/uL — AB (ref 1.5–6.5)
PLATELETS: 197 10*3/uL (ref 140–400)
RBC: 4.07 10*6/uL — AB (ref 4.20–5.82)
RDW: 16.7 % — ABNORMAL HIGH (ref 11.0–14.6)
WBC: 10.9 10*3/uL — AB (ref 4.0–10.3)

## 2013-10-06 LAB — LACTATE DEHYDROGENASE (CC13): LDH: 161 U/L (ref 125–245)

## 2013-10-06 LAB — PHOSPHORUS: PHOSPHORUS: 2.1 mg/dL — AB (ref 2.3–4.6)

## 2013-10-06 LAB — MAGNESIUM (CC13): Magnesium: 2 mg/dl (ref 1.5–2.5)

## 2013-10-06 MED ORDER — HEPARIN SOD (PORK) LOCK FLUSH 100 UNIT/ML IV SOLN
500.0000 [IU] | Freq: Once | INTRAVENOUS | Status: AC | PRN
  Administered 2013-10-06: 500 [IU]
  Filled 2013-10-06: qty 5

## 2013-10-06 MED ORDER — SODIUM CHLORIDE 0.9 % IV SOLN
Freq: Once | INTRAVENOUS | Status: AC
  Administered 2013-10-06: 10:00:00 via INTRAVENOUS

## 2013-10-06 MED ORDER — SODIUM CHLORIDE 0.9 % IJ SOLN
10.0000 mL | INTRAMUSCULAR | Status: DC | PRN
  Administered 2013-10-06: 10 mL
  Filled 2013-10-06: qty 10

## 2013-10-06 MED ORDER — HEPARIN SOD (PORK) LOCK FLUSH 100 UNIT/ML IV SOLN
500.0000 [IU] | Freq: Once | INTRAVENOUS | Status: AC
  Administered 2013-10-06: 500 [IU] via INTRAVENOUS
  Filled 2013-10-06: qty 5

## 2013-10-06 MED ORDER — SODIUM CHLORIDE 0.9 % IJ SOLN
10.0000 mL | INTRAMUSCULAR | Status: DC | PRN
  Administered 2013-10-06: 10 mL via INTRAVENOUS
  Filled 2013-10-06: qty 10

## 2013-10-06 MED ORDER — SODIUM CHLORIDE 0.9 % IV SOLN
3.0000 mg/kg | Freq: Once | INTRAVENOUS | Status: AC
  Administered 2013-10-06: 271 mg via INTRAVENOUS
  Filled 2013-10-06: qty 27.1

## 2013-10-06 NOTE — Progress Notes (Unsigned)
10/06/13 @10 :00 am, [facility] [id number], Cycle 9:  [name] [name] into the [facility], accompanied by his wife and sister-in-law, for labs, to see Dr. Julien Nordmann and to receive nivolumab.  His labs were all appropriate for treatment.  He had CT scans on 10/04/09 which revealed stable disease.  He has decided that after today's treatment he would like to withdraw from the study and return to the care of Dr. Oliva Bustard on [facility] to receive commercial nivolumab.  The only reason is the convenience of the location since he lives in [place].  He had had some reported decrease in his oxygen saturations at home.  The patient has a pulse oximeter at home.  His wife reports that when he is more active his saturations have dropped below 80% at times, beginning on 10/04/13.  He uses oxygen only at night for now, but will be re-evaluated for increasing oxygen to during the day.  He denies any worsening cough or difficulty breathing, just the drop in oxygen saturation.  Spoke with Oneta Rack, RN in the infusion room about his treatment for today and provided her with a sign for the pump.  Emphasized the duration of the drug and the use of the 0.22 micron filter.  Kit assignment made through [facility], given to the pharmacy.   [name] [name] signed a Withdrawal of Consent form, but will allow [place] to follow him for survival only. He does not agree to return for any follow-up clinical assessments or radiological studies, even though he has not progressed.  He was given a copy of the signed Withdrawal Form to take home.

## 2013-10-06 NOTE — Telephone Encounter (Signed)
Certificate of Medical Necessity for home O2 filled out, signed by Dr Vista Mink and faxed to [place].  SLJ

## 2013-10-06 NOTE — Progress Notes (Signed)
[place] [phone]   [phone]  OFFICE PROGRESS NOTE  [name], [name], MD [place] [address]  DIAGNOSIS: Metastatic non-small cell lung cancer, squamous cell carcinoma initially diagnosed as stage IIIa non-small cell lung cancer diagnosed in June of 2012.   PRIOR THERAPY: 1) status post a course of concurrent chemoradiation with weekly carboplatin and paclitaxel completed in September of 2012 under the care of Dr. Oliva Bustard. 2) the patient was followed by observation and in September of 2013 he had evidence for disease progression. 3) he was retreated again with a short course of concurrent chemoradiation with carboplatin and paclitaxel completed in November of 2013. 4) he was followed by observation until April 2014 when he was found to have evidence for disease progression. 5) He has a Soil scientist test good and the patient was started on treatment with Tarceva 150 mg by mouth daily for around 7 months discontinued on 05/02/2013 secondary to disease.   CURRENT THERAPY:immunotherapy according to the [facility] clinical trial CA [id number] with Nivolumab 3.0  MG/KG every 2 weeks, status post 8 cycles. First dose 06/16/2013.   INTERVAL HISTORY: [name] [name] 80 y.o. male returns to the clinic today for follow up visit accompanied by his wife and daughter-in-law.  He is feeling fine today except for the fatigue in addition to increasing baseline shortness breath and he is requesting home oxygen.  His cough has significantly improved. He also has some occasional confusion in the morning after treatment with Remeron and Xanax at night time.  He is tolerating his treatment with Nivolumab fairly well with no significant adverse effects. He denied having any fever or chills. The patient denied having any nausea or vomiting, He has no significant weight loss or night sweats. He has repeat CT scan of the chest, abdomen and pelvis performed recently and he is  here for evaluation and discussion of his scan results.  MEDICAL HISTORY: Past Medical History  Diagnosis Date  . Hypercholesteremia   . Hypertension   . Diabetes mellitus without complication   . AAA (abdominal aortic aneurysm)   . CAD (coronary artery disease)   . Lung cancer     09/2010  . Skin cancer     ALLERGIES:  has No Known Allergies.  MEDICATIONS:  Current Outpatient Prescriptions  Medication Sig Dispense Refill  . ADVAIR DISKUS 250-50 MCG/DOSE AEPB Inhale 1 puff into the lungs. BID      . ALPRAZolam (XANAX) 0.25 MG tablet Take 1 tablet (0.25 mg total) by mouth 2 (two) times daily.  30 tablet  0  . benzonatate (TESSALON) 200 MG capsule Take 200 mg by mouth 3 (three) times daily as needed for cough.      . fluticasone (FLONASE) 50 MCG/ACT nasal spray 1 spray. 1 spray in each nostril BID      . furosemide (LASIX) 20 MG tablet       . Hydrocodone-Chlorpheniramine 5-4 MG/5ML SOLN Take 5 mLs by mouth 2 (two) times daily.  240 mL  0  . insulin aspart (NOVOLOG) 100 UNIT/ML injection Inject into the skin as directed. Check before each meal, take as directed per sliding scale      . ipratropium-albuterol (DUONEB) 0.5-2.5 (3) MG/3ML SOLN Take 3 mLs by nebulization every 8 (eight) hours as needed.      Marland Kitchen LANTUS SOLOSTAR 100 UNIT/ML Solostar Pen 22 Units. 22 units at bedtime      . losartan (COZAAR) 50 MG tablet Take  50 mg by mouth daily.      . metoprolol succinate (TOPROL-XL) 100 MG 24 hr tablet Take 100 mg by mouth daily.      . mirtazapine (REMERON) 30 MG tablet Take 1 tablet (30 mg total) by mouth at bedtime.  30 tablet  2  . nystatin (MYCOSTATIN) 100000 UNIT/ML suspension Take 5 mLs (500,000 Units total) by mouth 4 (four) times daily.  240 mL  0  . omeprazole (PRILOSEC) 40 MG capsule Take 1 capsule (40 mg total) by mouth daily.  30 capsule  2  . PROAIR HFA 108 (90 BASE) MCG/ACT inhaler Inhale 2 puffs into the lungs 4 (four) times daily as needed.      . nystatin cream  (MYCOSTATIN) 3 (three) times daily. Apply to affected area three times daily      . simvastatin (ZOCOR) 20 MG tablet Take 20 mg by mouth daily.       No current facility-administered medications for this visit.   Facility-Administered Medications Ordered in Other Visits  Medication Dose Route Frequency Provider Last Rate Last Dose  . sodium chloride 0.9 % injection 10 mL  10 mL Intravenous PRN Curt Bears, MD   10 mL at 09/08/13 3149    SURGICAL HISTORY:  Past Surgical History  Procedure Laterality Date  . Abdominal aortic aneurysm repair  2000  . Coronary stent placement  2000  . Tonsillectomy      when he was a child    REVIEW OF SYSTEMS:  Constitutional: positive for fatigue Eyes: negative Ears, nose, mouth, throat, and face: negative Respiratory: positive for cough and dyspnea on exertion Cardiovascular: negative Gastrointestinal: negative Genitourinary:negative Integument/breast: negative Hematologic/lymphatic: negative Musculoskeletal:negative Neurological: negative Behavioral/Psych: negative Endocrine: negative Allergic/Immunologic: negative   PHYSICAL EXAMINATION: General appearance: alert, cooperative, fatigued and no distress Head: Normocephalic, without obvious abnormality, atraumatic Neck: no adenopathy, no JVD, supple, symmetrical, trachea midline and thyroid not enlarged, symmetric, no tenderness/mass/nodules Lymph nodes: Cervical, supraclavicular, and axillary nodes normal. Resp: wheezes RLL and RML Back: symmetric, no curvature. ROM normal. No CVA tenderness. Cardio: regular rate and rhythm, S1, S2 normal, no murmur, click, rub or gallop GI: soft, non-tender; bowel sounds normal; no masses,  no organomegaly Extremities: extremities normal, atraumatic, no cyanosis or edema Neurologic: Alert and oriented X 3, normal strength and tone. Normal symmetric reflexes. Normal coordination and gait  ECOG PERFORMANCE STATUS: 2 - Symptomatic, <50% confined to  bed  Blood pressure 126/64, pulse 92, temperature 99 F (37.2 C), temperature source Oral, resp. rate 19, height 5\' 9"  (1.753 m), weight 189 lb 6.4 oz (85.911 kg), SpO2 86.00%.  LABORATORY DATA: Lab Results  Component Value Date   WBC 10.9* 10/06/2013   HGB 10.6* 10/06/2013   HCT 34.5* 10/06/2013   MCV 84.8 10/06/2013   PLT 197 10/06/2013      Chemistry      Component Value Date/Time   NA 144 10/06/2013 0840   K 3.9 10/06/2013 0840   CO2 35* 10/06/2013 0840   BUN 16.6 10/06/2013 0840   CREATININE 1.2 10/06/2013 0840      Component Value Date/Time   CALCIUM 9.0 10/06/2013 0840   ALKPHOS 71 10/06/2013 0840   AST 13 10/06/2013 0840   ALT 15 10/06/2013 0840   BILITOT 0.46 10/06/2013 0840       RADIOGRAPHIC STUDIES: Ct Chest W Contrast  10/04/2013   CLINICAL DATA:  Lung cancer diagnosed in 2012. Radiation therapy completed 1 year ago; chemotherapy in progress. RECIST patient.  EXAM: CT  CHEST AND ABDOMEN WITH CONTRAST  TECHNIQUE: Multidetector CT imaging of the chest and abdomen was performed following the standard protocol during bolus administration of intravenous contrast.  CONTRAST:  175mL OMNIPAQUE IOHEXOL 300 MG/ML  SOLN  COMPARISON:  Prior examinations 08/09/2013 and 06/07/2013.  FINDINGS: RECIST 1.1  Target Lesions:  1. Previously reported right infrahilar mass is poorly defined due to adjacent atelectatic lung. However, as measured at the same level, this measures 7.4 x 6.8 cm on image 38 (previously 8.1 x 7.0 cm).  CT CHEST FINDINGS  As noted above, there is a right infrahilar mass which occludes the bronchus intermedius. Due to resulting subtotal collapse of the right middle and lower lobes, this mass is suboptimally defined and measured. There is extrinsic mass effect on the right upper lobe bronchus which is moderately narrowed, but stable. There is slightly progressive right upper lobe atelectasis without obvious focal mass. Scattered nodularity within the left upper lobe and superior  segment of the left lower lobe is grossly stable.  The moderate-sized loculated right pleural effusion is grossly stable with associated pleural thickening. There is no significant left pleural or pericardial effusion. Bilateral gynecomastia noted.  There are no enlarged mediastinal, hilar or axillary lymph nodes. Right IJ Port-A-Cath extends to the SVC right atrial junction. There is diffuse atherosclerosis of the aorta, great vessels and coronary arteries. 2.5 cm left thyroid nodule on image 12 is stable.  CT ABDOMEN FINDINGS  The liver, spleen and adrenal glands appear normal without evidence of metastatic disease. There are small calcified gallstones dependently within the gallbladder lumen. There is no biliary dilatation. The pancreas is mildly atrophied without focal abnormality.  Multiple cystic lesions and cortical thinning in both kidneys are grossly stable. There is a stable nonobstructing calculus in the lower pole of the left kidney.  There is no evidence of ascites or abdominal lymphadenopathy. There is no peritoneal nodularity.  There is stable aortoiliac atherosclerosis status post aorto bi-iliac grafting. The stomach and visualized portions of the small bowel and colon appear unremarkable.  There are stable degenerative changes throughout the thoracolumbar spine. No worrisome osseous findings are demonstrated.  IMPRESSION: 1. Little change is identified within the chest. The right infrahilar mass is suboptimally measured due to subtotal collapse of the right middle and lower lobes. PET-CT may be more accurate in measuring residual tumor in this area. 2. Stable loculated right pleural effusion and scattered nodularity within the aerated lungs. 3. No evidence of metastatic disease to the abdomen or osseous structures. 4. Stable bilateral renal cysts and nonobstructing left renal calculus.   Electronically Signed   By: Camie Patience M.D.   On: 10/04/2013 16:19   ASSESSMENT AND PLAN: This is a very  pleasant 80 years old white male with metastatic non-small cell lung cancer, squamous cell carcinoma that was initially diagnosed as stage IIIA. He is status post several chemotherapy regimens most recently with Tarceva but has evidence for disease progression. He is currently undergoing immunotherapy with Nivolumab and tolerating his treatment fairly well status post  8 cycles. His recent CT scan of the chest, abdomen and pelvis showed further decrease in the size of the right lower lobe mass with a stable disease in the abdomen and pelvis. I discussed the scan results with the patient and his family. I recommended for the patient to proceed with cycle #9 today as scheduled. The patient continues to have increasing shortness of breath at baseline and his oxygen saturation was less than 88% and the  clinic today. I will arrange for him to have home oxygen available to be used to liter/minute. The patient and his family would like to proceed with this cycle of the immunotherapy with Nivolumab here today but they prefer to further treatment with Nivolumab at [facility] close to home since the drug is currently approved.  He would discontinue treatment according to the clinical trial at this point based on his request but to receive the same treatment off protocol. For the lower extremity edema and shortness of breath, I will start the patient again on Lasix 20 mg by mouth daily as needed. For confusion, I advised the patient to avoid Xanax at night time. He would come back for follow up visit in 2 weeks for reevaluation and management any adverse effect of his treatment.  The patient voices understanding of current disease status and treatment options and is in agreement with the current care plan.  All questions were answered. The patient knows to call the clinic with any problems, questions or concerns. We can certainly see the patient much sooner if necessary.  Disclaimer: This note was  dictated with voice recognition software. Similar sounding words can inadvertently be transcribed and may not be corrected upon review.

## 2013-10-06 NOTE — Patient Instructions (Signed)

## 2013-10-06 NOTE — Progress Notes (Signed)
Oxygen Saturation Readings during office visit 10/06/13:  86% on Room Air at rest 80% ambulating on Room Air 91% ambulating on 2L  Per Dr Julien Nordmann, patient needs to be on 2 L oxygen continuously.  [name] [phone].

## 2013-10-08 ENCOUNTER — Inpatient Hospital Stay: Payer: Self-pay | Admitting: Internal Medicine

## 2013-10-08 LAB — COMPREHENSIVE METABOLIC PANEL
ALT: 22 U/L (ref 12–78)
ANION GAP: 0 — AB (ref 7–16)
AST: 28 U/L (ref 15–37)
Albumin: 3 g/dL — ABNORMAL LOW (ref 3.4–5.0)
Alkaline Phosphatase: 72 U/L
BILIRUBIN TOTAL: 0.5 mg/dL (ref 0.2–1.0)
BUN: 17 mg/dL (ref 7–18)
CHLORIDE: 99 mmol/L (ref 98–107)
CREATININE: 1.1 mg/dL (ref 0.60–1.30)
Calcium, Total: 8.7 mg/dL (ref 8.5–10.1)
Co2: 41 mmol/L (ref 21–32)
EGFR (Non-African Amer.): >60
Glucose: 143 mg/dL — ABNORMAL HIGH (ref 65–99)
Osmolality: 283 (ref 275–301)
Potassium: 4.6 mmol/L (ref 3.5–5.1)
SODIUM: 140 mmol/L (ref 136–145)
TOTAL PROTEIN: 6.6 g/dL (ref 6.4–8.2)

## 2013-10-08 LAB — CBC WITH DIFFERENTIAL/PLATELET
Basophil #: 0.1 10*3/uL (ref 0.0–0.1)
Basophil %: 0.5 %
Eosinophil #: 0.4 10*3/uL (ref 0.0–0.7)
Eosinophil %: 3.3 %
HCT: 36.9 % — ABNORMAL LOW (ref 40.0–52.0)
HGB: 11 g/dL — AB (ref 13.0–18.0)
Lymphocyte #: 0.4 10*3/uL — ABNORMAL LOW (ref 1.0–3.6)
Lymphocyte %: 3.9 %
MCH: 26 pg (ref 26.0–34.0)
MCHC: 29.9 g/dL — ABNORMAL LOW (ref 32.0–36.0)
MCV: 87 fL (ref 80–100)
MONO ABS: 1 x10 3/mm (ref 0.2–1.0)
MONOS PCT: 8.9 %
NEUTROS PCT: 83.4 %
Neutrophil #: 9.4 10*3/uL — ABNORMAL HIGH (ref 1.4–6.5)
Platelet: 183 10*3/uL (ref 150–440)
RBC: 4.23 10*6/uL — AB (ref 4.40–5.90)
RDW: 16.9 % — ABNORMAL HIGH (ref 11.5–14.5)
WBC: 11.3 10*3/uL — AB (ref 3.8–10.6)

## 2013-10-08 LAB — URINALYSIS, COMPLETE
BACTERIA: NONE SEEN
Bilirubin,UR: NEGATIVE
Blood: NEGATIVE
Glucose,UR: NEGATIVE mg/dL (ref 0–75)
KETONE: NEGATIVE
LEUKOCYTE ESTERASE: NEGATIVE
Nitrite: NEGATIVE
Ph: 5 (ref 4.5–8.0)
Protein: NEGATIVE
Specific Gravity: 1.015 (ref 1.003–1.030)
Squamous Epithelial: NONE SEEN
WBC UR: <1 /HPF (ref 0–5)

## 2013-10-09 LAB — CBC WITH DIFFERENTIAL/PLATELET
BASOS ABS: 0.1 10*3/uL (ref 0.0–0.1)
BASOS PCT: 1.4 %
EOS ABS: 0 10*3/uL (ref 0.0–0.7)
Eosinophil %: 0 %
HCT: 34.8 % — AB (ref 40.0–52.0)
HGB: 10.7 g/dL — AB (ref 13.0–18.0)
LYMPHS ABS: 0.2 10*3/uL — AB (ref 1.0–3.6)
Lymphocyte %: 2.6 %
MCH: 26.4 pg (ref 26.0–34.0)
MCHC: 30.8 g/dL — ABNORMAL LOW (ref 32.0–36.0)
MCV: 86 fL (ref 80–100)
MONO ABS: 0.1 x10 3/mm — AB (ref 0.2–1.0)
Monocyte %: 1.2 %
Neutrophil #: 7.7 10*3/uL — ABNORMAL HIGH (ref 1.4–6.5)
Neutrophil %: 94.8 %
Platelet: 169 10*3/uL (ref 150–440)
RBC: 4.06 10*6/uL — AB (ref 4.40–5.90)
RDW: 16.5 % — ABNORMAL HIGH (ref 11.5–14.5)
WBC: 8.2 10*3/uL (ref 3.8–10.6)

## 2013-10-09 LAB — BASIC METABOLIC PANEL
Anion Gap: 3 — ABNORMAL LOW (ref 7–16)
BUN: 19 mg/dL — AB (ref 7–18)
CALCIUM: 8.7 mg/dL (ref 8.5–10.1)
CHLORIDE: 98 mmol/L (ref 98–107)
CREATININE: 1.31 mg/dL — AB (ref 0.60–1.30)
Co2: 38 mmol/L — ABNORMAL HIGH (ref 21–32)
EGFR (African American): 59 — ABNORMAL LOW
GFR CALC NON AF AMER: 51 — AB
GLUCOSE: 140 mg/dL — AB (ref 65–99)
Osmolality: 282 (ref 275–301)
Potassium: 4.1 mmol/L (ref 3.5–5.1)
Sodium: 139 mmol/L (ref 136–145)

## 2013-10-11 LAB — BASIC METABOLIC PANEL
ANION GAP: 0 — AB (ref 7–16)
BUN: 30 mg/dL — AB (ref 7–18)
Calcium, Total: 8.6 mg/dL (ref 8.5–10.1)
Chloride: 101 mmol/L (ref 98–107)
Co2: 44 mmol/L (ref 21–32)
Creatinine: 1.58 mg/dL — ABNORMAL HIGH (ref 0.60–1.30)
EGFR (African American): 47 — ABNORMAL LOW
EGFR (Non-African Amer.): 41 — ABNORMAL LOW
Glucose: 60 mg/dL — ABNORMAL LOW (ref 65–99)
OSMOLALITY: 293 (ref 275–301)
Potassium: 3.8 mmol/L (ref 3.5–5.1)
Sodium: 145 mmol/L (ref 136–145)

## 2013-10-12 LAB — BASIC METABOLIC PANEL
ANION GAP: 3 — AB (ref 7–16)
BUN: 27 mg/dL — ABNORMAL HIGH (ref 7–18)
CALCIUM: 8.9 mg/dL (ref 8.5–10.1)
CO2: 42 mmol/L — AB (ref 21–32)
Chloride: 97 mmol/L — ABNORMAL LOW (ref 98–107)
Creatinine: 1.35 mg/dL — ABNORMAL HIGH (ref 0.60–1.30)
EGFR (Non-African Amer.): 49 — ABNORMAL LOW
GFR CALC AF AMER: 57 — AB
GLUCOSE: 188 mg/dL — AB (ref 65–99)
Osmolality: 293 (ref 275–301)
Potassium: 4.8 mmol/L (ref 3.5–5.1)
Sodium: 142 mmol/L (ref 136–145)

## 2013-10-13 LAB — CULTURE, BLOOD (SINGLE)

## 2013-10-20 ENCOUNTER — Ambulatory Visit: Payer: Medicare Other

## 2013-10-20 ENCOUNTER — Other Ambulatory Visit: Payer: Medicare Other

## 2013-10-20 ENCOUNTER — Ambulatory Visit: Payer: Medicare Other | Admitting: Physician Assistant

## 2013-10-20 ENCOUNTER — Ambulatory Visit: Payer: Self-pay | Admitting: Oncology

## 2013-10-20 LAB — COMPREHENSIVE METABOLIC PANEL
ALK PHOS: 61 U/L
Albumin: 2.8 g/dL — ABNORMAL LOW (ref 3.4–5.0)
Anion Gap: 1 — ABNORMAL LOW (ref 7–16)
BUN: 25 mg/dL — ABNORMAL HIGH (ref 7–18)
Bilirubin,Total: 0.4 mg/dL (ref 0.2–1.0)
CALCIUM: 8.2 mg/dL — AB (ref 8.5–10.1)
CREATININE: 1.46 mg/dL — AB (ref 0.60–1.30)
Chloride: 103 mmol/L (ref 98–107)
Co2: 37 mmol/L — ABNORMAL HIGH (ref 21–32)
EGFR (African American): 52 — ABNORMAL LOW
GFR CALC NON AF AMER: 45 — AB
Glucose: 179 mg/dL — ABNORMAL HIGH (ref 65–99)
OSMOLALITY: 290 (ref 275–301)
Potassium: 4.6 mmol/L (ref 3.5–5.1)
SGOT(AST): 10 U/L — ABNORMAL LOW (ref 15–37)
SGPT (ALT): 21 U/L (ref 12–78)
SODIUM: 141 mmol/L (ref 136–145)
Total Protein: 6 g/dL — ABNORMAL LOW (ref 6.4–8.2)

## 2013-10-20 LAB — CBC CANCER CENTER
BASOS ABS: 0.1 x10 3/mm (ref 0.0–0.1)
Basophil %: 0.9 %
Eosinophil #: 0.3 x10 3/mm (ref 0.0–0.7)
Eosinophil %: 2.3 %
HCT: 36.2 % — ABNORMAL LOW (ref 40.0–52.0)
HGB: 11.1 g/dL — AB (ref 13.0–18.0)
LYMPHS ABS: 0.4 x10 3/mm — AB (ref 1.0–3.6)
Lymphocyte %: 3.6 %
MCH: 26 pg (ref 26.0–34.0)
MCHC: 30.7 g/dL — AB (ref 32.0–36.0)
MCV: 85 fL (ref 80–100)
Monocyte #: 1.1 x10 3/mm — ABNORMAL HIGH (ref 0.2–1.0)
Monocyte %: 9.8 %
NEUTROS ABS: 9.4 x10 3/mm — AB (ref 1.4–6.5)
Neutrophil %: 83.4 %
PLATELETS: 136 x10 3/mm — AB (ref 150–440)
RBC: 4.28 10*6/uL — AB (ref 4.40–5.90)
RDW: 17.6 % — ABNORMAL HIGH (ref 11.5–14.5)
WBC: 11.2 x10 3/mm — AB (ref 3.8–10.6)

## 2013-10-26 ENCOUNTER — Ambulatory Visit: Payer: Self-pay | Admitting: Oncology

## 2013-10-27 DIAGNOSIS — E119 Type 2 diabetes mellitus without complications: Secondary | ICD-10-CM | POA: Insufficient documentation

## 2013-10-27 DIAGNOSIS — C3490 Malignant neoplasm of unspecified part of unspecified bronchus or lung: Secondary | ICD-10-CM | POA: Insufficient documentation

## 2013-10-27 DIAGNOSIS — J449 Chronic obstructive pulmonary disease, unspecified: Secondary | ICD-10-CM | POA: Insufficient documentation

## 2013-10-27 LAB — CBC CANCER CENTER
BASOS PCT: 0.3 %
Basophil #: 0 x10 3/mm (ref 0.0–0.1)
Eosinophil #: 0.1 x10 3/mm (ref 0.0–0.7)
Eosinophil %: 0.8 %
HCT: 37.8 % — AB (ref 40.0–52.0)
HGB: 11.5 g/dL — AB (ref 13.0–18.0)
LYMPHS PCT: 2.2 %
Lymphocyte #: 0.3 x10 3/mm — ABNORMAL LOW (ref 1.0–3.6)
MCH: 26.2 pg (ref 26.0–34.0)
MCHC: 30.5 g/dL — ABNORMAL LOW (ref 32.0–36.0)
MCV: 86 fL (ref 80–100)
Monocyte #: 0.5 x10 3/mm (ref 0.2–1.0)
Monocyte %: 3.6 %
Neutrophil #: 12.2 x10 3/mm — ABNORMAL HIGH (ref 1.4–6.5)
Neutrophil %: 93.1 %
Platelet: 136 x10 3/mm — ABNORMAL LOW (ref 150–440)
RBC: 4.4 10*6/uL (ref 4.40–5.90)
RDW: 18 % — ABNORMAL HIGH (ref 11.5–14.5)
WBC: 13.1 x10 3/mm — ABNORMAL HIGH (ref 3.8–10.6)

## 2013-10-27 LAB — COMPREHENSIVE METABOLIC PANEL
Albumin: 3.2 g/dL — ABNORMAL LOW (ref 3.4–5.0)
Alkaline Phosphatase: 68 U/L
Anion Gap: 6 — ABNORMAL LOW (ref 7–16)
BUN: 26 mg/dL — ABNORMAL HIGH (ref 7–18)
Bilirubin,Total: 0.3 mg/dL (ref 0.2–1.0)
CALCIUM: 8.7 mg/dL (ref 8.5–10.1)
Chloride: 103 mmol/L (ref 98–107)
Co2: 33 mmol/L — ABNORMAL HIGH (ref 21–32)
Creatinine: 1.57 mg/dL — ABNORMAL HIGH (ref 0.60–1.30)
GFR CALC AF AMER: 48 — AB
GFR CALC NON AF AMER: 41 — AB
GLUCOSE: 319 mg/dL — AB (ref 65–99)
Osmolality: 300 (ref 275–301)
Potassium: 4.3 mmol/L (ref 3.5–5.1)
SGOT(AST): 10 U/L — ABNORMAL LOW (ref 15–37)
SGPT (ALT): 23 U/L (ref 12–78)
Sodium: 142 mmol/L (ref 136–145)
Total Protein: 6.6 g/dL (ref 6.4–8.2)

## 2013-10-31 ENCOUNTER — Other Ambulatory Visit: Payer: Self-pay | Admitting: Physician Assistant

## 2013-11-10 LAB — CBC CANCER CENTER
BASOS ABS: 0.1 x10 3/mm (ref 0.0–0.1)
BASOS PCT: 0.6 %
EOS PCT: 0.3 %
Eosinophil #: 0 x10 3/mm (ref 0.0–0.7)
HCT: 39.3 % — AB (ref 40.0–52.0)
HGB: 12.2 g/dL — ABNORMAL LOW (ref 13.0–18.0)
LYMPHS PCT: 2.4 %
Lymphocyte #: 0.2 x10 3/mm — ABNORMAL LOW (ref 1.0–3.6)
MCH: 27 pg (ref 26.0–34.0)
MCHC: 31 g/dL — AB (ref 32.0–36.0)
MCV: 87 fL (ref 80–100)
MONO ABS: 0.4 x10 3/mm (ref 0.2–1.0)
MONOS PCT: 3.6 %
Neutrophil #: 9.9 x10 3/mm — ABNORMAL HIGH (ref 1.4–6.5)
Neutrophil %: 93.1 %
Platelet: 150 x10 3/mm (ref 150–440)
RBC: 4.52 10*6/uL (ref 4.40–5.90)
RDW: 17.2 % — ABNORMAL HIGH (ref 11.5–14.5)
WBC: 10.6 x10 3/mm (ref 3.8–10.6)

## 2013-11-24 LAB — COMPREHENSIVE METABOLIC PANEL
ANION GAP: 4 — AB (ref 7–16)
Albumin: 3.3 g/dL — ABNORMAL LOW (ref 3.4–5.0)
Alkaline Phosphatase: 74 U/L
BUN: 25 mg/dL — ABNORMAL HIGH (ref 7–18)
Bilirubin,Total: 0.4 mg/dL (ref 0.2–1.0)
Calcium, Total: 9.5 mg/dL (ref 8.5–10.1)
Chloride: 101 mmol/L (ref 98–107)
Co2: 36 mmol/L — ABNORMAL HIGH (ref 21–32)
Creatinine: 1.59 mg/dL — ABNORMAL HIGH (ref 0.60–1.30)
GFR CALC AF AMER: 47 — AB
GFR CALC NON AF AMER: 40 — AB
Glucose: 166 mg/dL — ABNORMAL HIGH (ref 65–99)
OSMOLALITY: 289 (ref 275–301)
Potassium: 5 mmol/L (ref 3.5–5.1)
SGOT(AST): 18 U/L (ref 15–37)
SGPT (ALT): 29 U/L
Sodium: 141 mmol/L (ref 136–145)
TOTAL PROTEIN: 6.7 g/dL (ref 6.4–8.2)

## 2013-11-24 LAB — CBC CANCER CENTER
Basophil #: 0 x10 3/mm (ref 0.0–0.1)
Basophil %: 0.2 %
EOS ABS: 0.1 x10 3/mm (ref 0.0–0.7)
EOS PCT: 0.5 %
HCT: 41.2 % (ref 40.0–52.0)
HGB: 12.8 g/dL — ABNORMAL LOW (ref 13.0–18.0)
LYMPHS ABS: 0.3 x10 3/mm — AB (ref 1.0–3.6)
Lymphocyte %: 2.5 %
MCH: 26.7 pg (ref 26.0–34.0)
MCHC: 31 g/dL — AB (ref 32.0–36.0)
MCV: 86 fL (ref 80–100)
MONOS PCT: 3.8 %
Monocyte #: 0.5 x10 3/mm (ref 0.2–1.0)
Neutrophil #: 11.7 x10 3/mm — ABNORMAL HIGH (ref 1.4–6.5)
Neutrophil %: 93 %
Platelet: 165 x10 3/mm (ref 150–440)
RBC: 4.79 10*6/uL (ref 4.40–5.90)
RDW: 17.1 % — ABNORMAL HIGH (ref 11.5–14.5)
WBC: 12.6 x10 3/mm — AB (ref 3.8–10.6)

## 2013-11-24 LAB — MAGNESIUM: MAGNESIUM: 1.9 mg/dL

## 2013-11-26 ENCOUNTER — Ambulatory Visit: Payer: Self-pay | Admitting: Oncology

## 2013-11-30 ENCOUNTER — Other Ambulatory Visit: Payer: Self-pay | Admitting: Internal Medicine

## 2013-12-08 LAB — CBC CANCER CENTER
BASOS ABS: 0 x10 3/mm (ref 0.0–0.1)
BASOS PCT: 0.3 %
EOS ABS: 0.2 x10 3/mm (ref 0.0–0.7)
Eosinophil %: 2 %
HCT: 39.1 % — ABNORMAL LOW (ref 40.0–52.0)
HGB: 12.1 g/dL — AB (ref 13.0–18.0)
LYMPHS ABS: 0.4 x10 3/mm — AB (ref 1.0–3.6)
Lymphocyte %: 3.4 %
MCH: 26.9 pg (ref 26.0–34.0)
MCHC: 30.9 g/dL — ABNORMAL LOW (ref 32.0–36.0)
MCV: 87 fL (ref 80–100)
MONOS PCT: 7.1 %
Monocyte #: 0.8 x10 3/mm (ref 0.2–1.0)
NEUTROS PCT: 87.2 %
Neutrophil #: 10.3 x10 3/mm — ABNORMAL HIGH (ref 1.4–6.5)
Platelet: 157 x10 3/mm (ref 150–440)
RBC: 4.5 10*6/uL (ref 4.40–5.90)
RDW: 16.5 % — AB (ref 11.5–14.5)
WBC: 11.9 x10 3/mm — ABNORMAL HIGH (ref 3.8–10.6)

## 2013-12-22 LAB — CBC CANCER CENTER
Basophil #: 0 x10 3/mm (ref 0.0–0.1)
Basophil %: 0.4 %
EOS PCT: 2.1 %
Eosinophil #: 0.2 x10 3/mm (ref 0.0–0.7)
HCT: 38.2 % — AB (ref 40.0–52.0)
HGB: 11.9 g/dL — AB (ref 13.0–18.0)
LYMPHS ABS: 0.4 x10 3/mm — AB (ref 1.0–3.6)
Lymphocyte %: 3.8 %
MCH: 27.4 pg (ref 26.0–34.0)
MCHC: 31.2 g/dL — AB (ref 32.0–36.0)
MCV: 88 fL (ref 80–100)
MONOS PCT: 7.2 %
Monocyte #: 0.7 x10 3/mm (ref 0.2–1.0)
Neutrophil #: 8.7 x10 3/mm — ABNORMAL HIGH (ref 1.4–6.5)
Neutrophil %: 86.5 %
PLATELETS: 163 x10 3/mm (ref 150–440)
RBC: 4.36 10*6/uL — AB (ref 4.40–5.90)
RDW: 15.6 % — AB (ref 11.5–14.5)
WBC: 10.1 x10 3/mm (ref 3.8–10.6)

## 2013-12-22 LAB — COMPREHENSIVE METABOLIC PANEL
ALBUMIN: 2.9 g/dL — AB (ref 3.4–5.0)
AST: 23 U/L (ref 15–37)
Alkaline Phosphatase: 64 U/L
Anion Gap: 6 — ABNORMAL LOW (ref 7–16)
BILIRUBIN TOTAL: 0.5 mg/dL (ref 0.2–1.0)
BUN: 25 mg/dL — ABNORMAL HIGH (ref 7–18)
Calcium, Total: 8.6 mg/dL (ref 8.5–10.1)
Chloride: 102 mmol/L (ref 98–107)
Co2: 33 mmol/L — ABNORMAL HIGH (ref 21–32)
Creatinine: 1.5 mg/dL — ABNORMAL HIGH (ref 0.60–1.30)
EGFR (African American): 50 — ABNORMAL LOW
GFR CALC NON AF AMER: 43 — AB
Glucose: 201 mg/dL — ABNORMAL HIGH (ref 65–99)
Osmolality: 291 (ref 275–301)
POTASSIUM: 4.3 mmol/L (ref 3.5–5.1)
SGPT (ALT): 21 U/L
Sodium: 141 mmol/L (ref 136–145)
Total Protein: 6.2 g/dL — ABNORMAL LOW (ref 6.4–8.2)

## 2013-12-22 LAB — MAGNESIUM: MAGNESIUM: 1.7 mg/dL — AB

## 2013-12-27 ENCOUNTER — Ambulatory Visit: Payer: Self-pay | Admitting: Oncology

## 2014-01-05 LAB — CBC CANCER CENTER
BASOS ABS: 0.1 x10 3/mm (ref 0.0–0.1)
Basophil %: 0.6 %
Eosinophil #: 0.2 x10 3/mm (ref 0.0–0.7)
Eosinophil %: 1.4 %
HCT: 39.1 % — ABNORMAL LOW (ref 40.0–52.0)
HGB: 12.1 g/dL — AB (ref 13.0–18.0)
LYMPHS PCT: 3.2 %
Lymphocyte #: 0.4 x10 3/mm — ABNORMAL LOW (ref 1.0–3.6)
MCH: 27.3 pg (ref 26.0–34.0)
MCHC: 31.1 g/dL — ABNORMAL LOW (ref 32.0–36.0)
MCV: 88 fL (ref 80–100)
Monocyte #: 0.6 x10 3/mm (ref 0.2–1.0)
Monocyte %: 5.2 %
Neutrophil #: 9.9 x10 3/mm — ABNORMAL HIGH (ref 1.4–6.5)
Neutrophil %: 89.6 %
PLATELETS: 156 x10 3/mm (ref 150–440)
RBC: 4.44 10*6/uL (ref 4.40–5.90)
RDW: 15.9 % — AB (ref 11.5–14.5)
WBC: 11.1 x10 3/mm — ABNORMAL HIGH (ref 3.8–10.6)

## 2014-01-09 DIAGNOSIS — E785 Hyperlipidemia, unspecified: Secondary | ICD-10-CM | POA: Insufficient documentation

## 2014-01-09 DIAGNOSIS — N189 Chronic kidney disease, unspecified: Secondary | ICD-10-CM | POA: Insufficient documentation

## 2014-01-19 LAB — COMPREHENSIVE METABOLIC PANEL
ALK PHOS: 67 U/L
ALT: 18 U/L
Albumin: 3.1 g/dL — ABNORMAL LOW (ref 3.4–5.0)
Anion Gap: 3 — ABNORMAL LOW (ref 7–16)
BILIRUBIN TOTAL: 0.5 mg/dL (ref 0.2–1.0)
BUN: 21 mg/dL — ABNORMAL HIGH (ref 7–18)
CHLORIDE: 106 mmol/L (ref 98–107)
CO2: 34 mmol/L — AB (ref 21–32)
CREATININE: 1.51 mg/dL — AB (ref 0.60–1.30)
Calcium, Total: 8.6 mg/dL (ref 8.5–10.1)
EGFR (African American): 58 — ABNORMAL LOW
GFR CALC NON AF AMER: 47 — AB
Glucose: 143 mg/dL — ABNORMAL HIGH (ref 65–99)
OSMOLALITY: 290 (ref 275–301)
Potassium: 3.6 mmol/L (ref 3.5–5.1)
SGOT(AST): 13 U/L — ABNORMAL LOW (ref 15–37)
Sodium: 143 mmol/L (ref 136–145)
TOTAL PROTEIN: 6.2 g/dL — AB (ref 6.4–8.2)

## 2014-01-19 LAB — CBC CANCER CENTER
BASOS PCT: 0.8 %
Basophil #: 0.1 x10 3/mm (ref 0.0–0.1)
EOS PCT: 2.4 %
Eosinophil #: 0.2 x10 3/mm (ref 0.0–0.7)
HCT: 37.2 % — ABNORMAL LOW (ref 40.0–52.0)
HGB: 11.6 g/dL — AB (ref 13.0–18.0)
LYMPHS ABS: 0.5 x10 3/mm — AB (ref 1.0–3.6)
LYMPHS PCT: 4.8 %
MCH: 27.7 pg (ref 26.0–34.0)
MCHC: 31.3 g/dL — ABNORMAL LOW (ref 32.0–36.0)
MCV: 89 fL (ref 80–100)
MONO ABS: 0.7 x10 3/mm (ref 0.2–1.0)
Monocyte %: 7 %
NEUTROS ABS: 8.6 x10 3/mm — AB (ref 1.4–6.5)
NEUTROS PCT: 85 %
PLATELETS: 145 x10 3/mm — AB (ref 150–440)
RBC: 4.2 10*6/uL — ABNORMAL LOW (ref 4.40–5.90)
RDW: 15.6 % — ABNORMAL HIGH (ref 11.5–14.5)
WBC: 10.1 x10 3/mm (ref 3.8–10.6)

## 2014-01-26 ENCOUNTER — Ambulatory Visit: Payer: Self-pay | Admitting: Oncology

## 2014-02-02 LAB — CBC CANCER CENTER
BASOS ABS: 0.1 x10 3/mm (ref 0.0–0.1)
Basophil %: 0.8 %
EOS PCT: 3.7 %
Eosinophil #: 0.3 x10 3/mm (ref 0.0–0.7)
HCT: 35.8 % — ABNORMAL LOW (ref 40.0–52.0)
HGB: 11 g/dL — ABNORMAL LOW (ref 13.0–18.0)
Lymphocyte #: 0.5 x10 3/mm — ABNORMAL LOW (ref 1.0–3.6)
Lymphocyte %: 6.6 %
MCH: 27.6 pg (ref 26.0–34.0)
MCHC: 30.9 g/dL — ABNORMAL LOW (ref 32.0–36.0)
MCV: 89 fL (ref 80–100)
Monocyte #: 0.6 x10 3/mm (ref 0.2–1.0)
Monocyte %: 7.7 %
Neutrophil #: 6.6 x10 3/mm — ABNORMAL HIGH (ref 1.4–6.5)
Neutrophil %: 81.2 %
Platelet: 150 x10 3/mm (ref 150–440)
RBC: 4 10*6/uL — ABNORMAL LOW (ref 4.40–5.90)
RDW: 15.5 % — AB (ref 11.5–14.5)
WBC: 8.2 x10 3/mm (ref 3.8–10.6)

## 2014-02-16 ENCOUNTER — Ambulatory Visit: Payer: Self-pay | Admitting: Oncology

## 2014-02-20 LAB — COMPREHENSIVE METABOLIC PANEL
ALBUMIN: 3.2 g/dL — AB (ref 3.4–5.0)
ALK PHOS: 72 U/L
Anion Gap: 4 — ABNORMAL LOW (ref 7–16)
BUN: 17 mg/dL (ref 7–18)
Bilirubin,Total: 0.6 mg/dL (ref 0.2–1.0)
CALCIUM: 8.9 mg/dL (ref 8.5–10.1)
Chloride: 104 mmol/L (ref 98–107)
Co2: 34 mmol/L — ABNORMAL HIGH (ref 21–32)
Creatinine: 1.62 mg/dL — ABNORMAL HIGH (ref 0.60–1.30)
GFR CALC AF AMER: 53 — AB
GFR CALC NON AF AMER: 44 — AB
GLUCOSE: 195 mg/dL — AB (ref 65–99)
OSMOLALITY: 290 (ref 275–301)
Potassium: 4.1 mmol/L (ref 3.5–5.1)
SGOT(AST): 11 U/L — ABNORMAL LOW (ref 15–37)
SGPT (ALT): 17 U/L
Sodium: 142 mmol/L (ref 136–145)
Total Protein: 6.5 g/dL (ref 6.4–8.2)

## 2014-02-20 LAB — CBC CANCER CENTER
BASOS ABS: 0 x10 3/mm (ref 0.0–0.1)
Basophil %: 0.4 %
Eosinophil #: 0.3 x10 3/mm (ref 0.0–0.7)
Eosinophil %: 3.5 %
HCT: 36.9 % — AB (ref 40.0–52.0)
HGB: 11.5 g/dL — ABNORMAL LOW (ref 13.0–18.0)
LYMPHS ABS: 0.3 x10 3/mm — AB (ref 1.0–3.6)
LYMPHS PCT: 3.8 %
MCH: 28.1 pg (ref 26.0–34.0)
MCHC: 31 g/dL — ABNORMAL LOW (ref 32.0–36.0)
MCV: 90 fL (ref 80–100)
Monocyte #: 0.6 x10 3/mm (ref 0.2–1.0)
Monocyte %: 6.6 %
NEUTROS PCT: 85.7 %
Neutrophil #: 7.7 x10 3/mm — ABNORMAL HIGH (ref 1.4–6.5)
Platelet: 172 x10 3/mm (ref 150–440)
RBC: 4.09 10*6/uL — ABNORMAL LOW (ref 4.40–5.90)
RDW: 14.9 % — AB (ref 11.5–14.5)
WBC: 9 x10 3/mm (ref 3.8–10.6)

## 2014-02-26 ENCOUNTER — Ambulatory Visit: Payer: Self-pay | Admitting: Oncology

## 2014-03-06 LAB — CBC CANCER CENTER
Basophil #: 0.1 x10 3/mm (ref 0.0–0.1)
Basophil %: 0.8 %
EOS ABS: 0.3 x10 3/mm (ref 0.0–0.7)
Eosinophil %: 2.8 %
HCT: 35.7 % — AB (ref 40.0–52.0)
HGB: 11.2 g/dL — ABNORMAL LOW (ref 13.0–18.0)
Lymphocyte #: 0.3 x10 3/mm — ABNORMAL LOW (ref 1.0–3.6)
Lymphocyte %: 2.8 %
MCH: 28.2 pg (ref 26.0–34.0)
MCHC: 31.5 g/dL — AB (ref 32.0–36.0)
MCV: 90 fL (ref 80–100)
MONOS PCT: 5.7 %
Monocyte #: 0.6 x10 3/mm (ref 0.2–1.0)
NEUTROS PCT: 87.9 %
Neutrophil #: 9.1 x10 3/mm — ABNORMAL HIGH (ref 1.4–6.5)
PLATELETS: 179 x10 3/mm (ref 150–440)
RBC: 3.98 10*6/uL — ABNORMAL LOW (ref 4.40–5.90)
RDW: 14.4 % (ref 11.5–14.5)
WBC: 10.3 x10 3/mm (ref 3.8–10.6)

## 2014-03-13 LAB — CBC CANCER CENTER
BASOS PCT: 0.5 %
Basophil #: 0.1 x10 3/mm (ref 0.0–0.1)
Eosinophil #: 0.3 x10 3/mm (ref 0.0–0.7)
Eosinophil %: 3 %
HCT: 38 % — ABNORMAL LOW (ref 40.0–52.0)
HGB: 11.7 g/dL — AB (ref 13.0–18.0)
Lymphocyte #: 0.4 x10 3/mm — ABNORMAL LOW (ref 1.0–3.6)
Lymphocyte %: 3.8 %
MCH: 27.6 pg (ref 26.0–34.0)
MCHC: 30.9 g/dL — ABNORMAL LOW (ref 32.0–36.0)
MCV: 90 fL (ref 80–100)
Monocyte #: 0.8 x10 3/mm (ref 0.2–1.0)
Monocyte %: 6.6 %
Neutrophil #: 10.1 x10 3/mm — ABNORMAL HIGH (ref 1.4–6.5)
Neutrophil %: 86.1 %
Platelet: 182 x10 3/mm (ref 150–440)
RBC: 4.25 10*6/uL — ABNORMAL LOW (ref 4.40–5.90)
RDW: 14.7 % — AB (ref 11.5–14.5)
WBC: 11.8 x10 3/mm — AB (ref 3.8–10.6)

## 2014-03-13 LAB — COMPREHENSIVE METABOLIC PANEL
ALBUMIN: 3.3 g/dL — AB (ref 3.4–5.0)
ALK PHOS: 71 U/L
Anion Gap: 7 (ref 7–16)
BUN: 22 mg/dL — ABNORMAL HIGH (ref 7–18)
Bilirubin,Total: 0.6 mg/dL (ref 0.2–1.0)
CHLORIDE: 102 mmol/L (ref 98–107)
CO2: 35 mmol/L — AB (ref 21–32)
CREATININE: 1.53 mg/dL — AB (ref 0.60–1.30)
Calcium, Total: 8.8 mg/dL (ref 8.5–10.1)
GFR CALC AF AMER: 57 — AB
GFR CALC NON AF AMER: 47 — AB
Glucose: 189 mg/dL — ABNORMAL HIGH (ref 65–99)
Osmolality: 295 (ref 275–301)
Potassium: 3.9 mmol/L (ref 3.5–5.1)
SGOT(AST): 10 U/L — ABNORMAL LOW (ref 15–37)
SGPT (ALT): 18 U/L
Sodium: 144 mmol/L (ref 136–145)
TOTAL PROTEIN: 6.5 g/dL (ref 6.4–8.2)

## 2014-03-13 LAB — MAGNESIUM: Magnesium: 1.7 mg/dL — ABNORMAL LOW

## 2014-03-27 LAB — CBC CANCER CENTER
BASOS PCT: 0.8 %
Basophil #: 0.1 x10 3/mm (ref 0.0–0.1)
EOS ABS: 0.2 x10 3/mm (ref 0.0–0.7)
Eosinophil %: 1.4 %
HCT: 38 % — AB (ref 40.0–52.0)
HGB: 11.8 g/dL — AB (ref 13.0–18.0)
Lymphocyte #: 0.3 x10 3/mm — ABNORMAL LOW (ref 1.0–3.6)
Lymphocyte %: 3.1 %
MCH: 27.9 pg (ref 26.0–34.0)
MCHC: 31.1 g/dL — AB (ref 32.0–36.0)
MCV: 90 fL (ref 80–100)
MONO ABS: 0.5 x10 3/mm (ref 0.2–1.0)
Monocyte %: 4.6 %
NEUTROS PCT: 90.1 %
Neutrophil #: 9.9 x10 3/mm — ABNORMAL HIGH (ref 1.4–6.5)
Platelet: 145 x10 3/mm — ABNORMAL LOW (ref 150–440)
RBC: 4.24 10*6/uL — ABNORMAL LOW (ref 4.40–5.90)
RDW: 14.6 % — ABNORMAL HIGH (ref 11.5–14.5)
WBC: 11 x10 3/mm — ABNORMAL HIGH (ref 3.8–10.6)

## 2014-03-28 ENCOUNTER — Ambulatory Visit: Payer: Self-pay | Admitting: Oncology

## 2014-04-10 LAB — COMPREHENSIVE METABOLIC PANEL
AST: 14 U/L — AB (ref 15–37)
Albumin: 3.4 g/dL (ref 3.4–5.0)
Alkaline Phosphatase: 75 U/L
Anion Gap: 6 — ABNORMAL LOW (ref 7–16)
BILIRUBIN TOTAL: 0.4 mg/dL (ref 0.2–1.0)
BUN: 16 mg/dL (ref 7–18)
Calcium, Total: 8.6 mg/dL (ref 8.5–10.1)
Chloride: 103 mmol/L (ref 98–107)
Co2: 34 mmol/L — ABNORMAL HIGH (ref 21–32)
Creatinine: 1.49 mg/dL — ABNORMAL HIGH (ref 0.60–1.30)
EGFR (African American): 58 — ABNORMAL LOW
EGFR (Non-African Amer.): 48 — ABNORMAL LOW
Glucose: 212 mg/dL — ABNORMAL HIGH (ref 65–99)
OSMOLALITY: 292 (ref 275–301)
POTASSIUM: 3.8 mmol/L (ref 3.5–5.1)
SGPT (ALT): 21 U/L
Sodium: 143 mmol/L (ref 136–145)
Total Protein: 6.4 g/dL (ref 6.4–8.2)

## 2014-04-10 LAB — CBC CANCER CENTER
BASOS ABS: 0.1 x10 3/mm (ref 0.0–0.1)
Basophil %: 0.5 %
Eosinophil #: 0.2 x10 3/mm (ref 0.0–0.7)
Eosinophil %: 1.8 %
HCT: 38 % — ABNORMAL LOW (ref 40.0–52.0)
HGB: 11.8 g/dL — ABNORMAL LOW (ref 13.0–18.0)
LYMPHS ABS: 0.4 x10 3/mm — AB (ref 1.0–3.6)
LYMPHS PCT: 4.1 %
MCH: 27.6 pg (ref 26.0–34.0)
MCHC: 31 g/dL — ABNORMAL LOW (ref 32.0–36.0)
MCV: 89 fL (ref 80–100)
MONO ABS: 0.5 x10 3/mm (ref 0.2–1.0)
Monocyte %: 4.4 %
Neutrophil #: 9.3 x10 3/mm — ABNORMAL HIGH (ref 1.4–6.5)
Neutrophil %: 89.2 %
Platelet: 160 x10 3/mm (ref 150–440)
RBC: 4.28 10*6/uL — ABNORMAL LOW (ref 4.40–5.90)
RDW: 14.4 % (ref 11.5–14.5)
WBC: 10.4 x10 3/mm (ref 3.8–10.6)

## 2014-04-10 LAB — MAGNESIUM: Magnesium: 1.9 mg/dL

## 2014-04-28 ENCOUNTER — Ambulatory Visit: Payer: Self-pay | Admitting: Oncology

## 2014-05-08 LAB — CBC CANCER CENTER
BASOS ABS: 0.1 x10 3/mm (ref 0.0–0.1)
Basophil %: 0.9 %
Eosinophil #: 0.1 x10 3/mm (ref 0.0–0.7)
Eosinophil %: 1.2 %
HCT: 38.7 % — AB (ref 40.0–52.0)
HGB: 11.9 g/dL — ABNORMAL LOW (ref 13.0–18.0)
LYMPHS ABS: 0.4 x10 3/mm — AB (ref 1.0–3.6)
Lymphocyte %: 3.6 %
MCH: 27.2 pg (ref 26.0–34.0)
MCHC: 30.9 g/dL — AB (ref 32.0–36.0)
MCV: 88 fL (ref 80–100)
MONO ABS: 0.5 x10 3/mm (ref 0.2–1.0)
MONOS PCT: 4.5 %
Neutrophil #: 9.9 x10 3/mm — ABNORMAL HIGH (ref 1.4–6.5)
Neutrophil %: 89.8 %
PLATELETS: 156 x10 3/mm (ref 150–440)
RBC: 4.38 10*6/uL — ABNORMAL LOW (ref 4.40–5.90)
RDW: 14.8 % — AB (ref 11.5–14.5)
WBC: 11 x10 3/mm — ABNORMAL HIGH (ref 3.8–10.6)

## 2014-05-08 LAB — COMPREHENSIVE METABOLIC PANEL
ANION GAP: 6 — AB (ref 7–16)
Albumin: 3.2 g/dL — ABNORMAL LOW (ref 3.4–5.0)
Alkaline Phosphatase: 76 U/L
BUN: 30 mg/dL — AB (ref 7–18)
Bilirubin,Total: 0.3 mg/dL (ref 0.2–1.0)
CREATININE: 1.75 mg/dL — AB (ref 0.60–1.30)
Calcium, Total: 8.6 mg/dL (ref 8.5–10.1)
Chloride: 103 mmol/L (ref 98–107)
Co2: 31 mmol/L (ref 21–32)
GFR CALC AF AMER: 48 — AB
GFR CALC NON AF AMER: 40 — AB
Glucose: 283 mg/dL — ABNORMAL HIGH (ref 65–99)
Osmolality: 296 (ref 275–301)
POTASSIUM: 4.3 mmol/L (ref 3.5–5.1)
SGOT(AST): 11 U/L — ABNORMAL LOW (ref 15–37)
SGPT (ALT): 19 U/L
SODIUM: 140 mmol/L (ref 136–145)
TOTAL PROTEIN: 6.5 g/dL (ref 6.4–8.2)

## 2014-05-08 LAB — TSH: THYROID STIMULATING HORM: 0.94 u[IU]/mL

## 2014-05-10 ENCOUNTER — Ambulatory Visit: Payer: Self-pay | Admitting: Ophthalmology

## 2014-05-29 ENCOUNTER — Ambulatory Visit: Payer: Self-pay | Admitting: Oncology

## 2014-06-02 ENCOUNTER — Other Ambulatory Visit: Payer: Self-pay | Admitting: *Deleted

## 2014-06-05 LAB — COMPREHENSIVE METABOLIC PANEL
ANION GAP: 7 (ref 7–16)
AST: 10 U/L — AB (ref 15–37)
Albumin: 3.2 g/dL — ABNORMAL LOW (ref 3.4–5.0)
Alkaline Phosphatase: 79 U/L (ref 46–116)
BUN: 20 mg/dL — ABNORMAL HIGH (ref 7–18)
Bilirubin,Total: 0.4 mg/dL (ref 0.2–1.0)
CALCIUM: 8.6 mg/dL (ref 8.5–10.1)
CHLORIDE: 105 mmol/L (ref 98–107)
CO2: 30 mmol/L (ref 21–32)
Creatinine: 1.62 mg/dL — ABNORMAL HIGH (ref 0.60–1.30)
GFR CALC AF AMER: 53 — AB
GFR CALC NON AF AMER: 44 — AB
Glucose: 275 mg/dL — ABNORMAL HIGH (ref 65–99)
OSMOLALITY: 296 (ref 275–301)
POTASSIUM: 4.2 mmol/L (ref 3.5–5.1)
SGPT (ALT): 18 U/L (ref 14–63)
SODIUM: 142 mmol/L (ref 136–145)
Total Protein: 6.4 g/dL (ref 6.4–8.2)

## 2014-06-05 LAB — CBC CANCER CENTER
Basophil #: 0.1 x10 3/mm (ref 0.0–0.1)
Basophil %: 0.5 %
EOS PCT: 1.6 %
Eosinophil #: 0.2 x10 3/mm (ref 0.0–0.7)
HCT: 38.5 % — AB (ref 40.0–52.0)
HGB: 12 g/dL — AB (ref 13.0–18.0)
LYMPHS ABS: 0.3 x10 3/mm — AB (ref 1.0–3.6)
Lymphocyte %: 3.5 %
MCH: 27.4 pg (ref 26.0–34.0)
MCHC: 31.2 g/dL — AB (ref 32.0–36.0)
MCV: 88 fL (ref 80–100)
MONO ABS: 0.5 x10 3/mm (ref 0.2–1.0)
MONOS PCT: 5.4 %
NEUTROS ABS: 8.6 x10 3/mm — AB (ref 1.4–6.5)
Neutrophil %: 89 %
Platelet: 172 x10 3/mm (ref 150–440)
RBC: 4.4 10*6/uL (ref 4.40–5.90)
RDW: 14.8 % — ABNORMAL HIGH (ref 11.5–14.5)
WBC: 9.6 x10 3/mm (ref 3.8–10.6)

## 2014-06-27 ENCOUNTER — Ambulatory Visit
Admit: 2014-06-27 | Disposition: A | Discharge status: Home / Self Care | Payer: Self-pay | Attending: Oncology | Admitting: Oncology

## 2014-07-13 ENCOUNTER — Ambulatory Visit: Payer: Self-pay | Admitting: Oncology

## 2014-07-28 ENCOUNTER — Ambulatory Visit
Admit: 2014-07-28 | Disposition: A | Discharge status: Home / Self Care | Payer: Self-pay | Attending: Oncology | Admitting: Oncology

## 2014-08-14 LAB — CBC CANCER CENTER
BASOS PCT: 1 %
Basophil #: 0.1 x10 3/mm (ref 0.0–0.1)
EOS ABS: 0.5 x10 3/mm (ref 0.0–0.7)
Eosinophil %: 5.8 %
HCT: 34.9 % — AB (ref 40.0–52.0)
HGB: 10.9 g/dL — ABNORMAL LOW (ref 13.0–18.0)
LYMPHS ABS: 0.4 x10 3/mm — AB (ref 1.0–3.6)
Lymphocyte %: 5.5 %
MCH: 26.9 pg (ref 26.0–34.0)
MCHC: 31.3 g/dL — AB (ref 32.0–36.0)
MCV: 86 fL (ref 80–100)
MONO ABS: 0.6 x10 3/mm (ref 0.2–1.0)
Monocyte %: 7.4 %
NEUTROS ABS: 6.5 x10 3/mm (ref 1.4–6.5)
Neutrophil %: 80.3 %
Platelet: 177 x10 3/mm (ref 150–440)
RBC: 4.06 10*6/uL — ABNORMAL LOW (ref 4.40–5.90)
RDW: 14.3 % (ref 11.5–14.5)
WBC: 8.1 x10 3/mm (ref 3.8–10.6)

## 2014-08-14 LAB — COMPREHENSIVE METABOLIC PANEL
ALBUMIN: 3.6 g/dL
ALK PHOS: 65 U/L
Anion Gap: 7 (ref 7–16)
BILIRUBIN TOTAL: 0.6 mg/dL
BUN: 21 mg/dL — ABNORMAL HIGH
CHLORIDE: 103 mmol/L
CREATININE: 1.43 mg/dL — AB
Calcium, Total: 8.6 mg/dL — ABNORMAL LOW
Co2: 28 mmol/L
EGFR (African American): 53 — ABNORMAL LOW
EGFR (Non-African Amer.): 46 — ABNORMAL LOW
GLUCOSE: 182 mg/dL — AB
POTASSIUM: 3.9 mmol/L
SGOT(AST): 14 U/L — ABNORMAL LOW
SGPT (ALT): 13 U/L — ABNORMAL LOW
Sodium: 138 mmol/L
Total Protein: 6.3 g/dL — ABNORMAL LOW

## 2014-08-14 LAB — TSH: Thyroid Stimulating Horm: 0.963 u[IU]/mL

## 2014-08-19 NOTE — H&P (Signed)
PATIENT NAME:  [name] [name], [name] [name] MR#:  [id number] DATE OF BIRTH:  09-Dec-1930  DATE OF ADMISSION:  10/08/2013  REFERRING PHYSICIAN: Dr. Jasmine December.   FAMILY PHYSICIAN: Dr. Baldemar Lenis.   REASON FOR ADMISSION: Acute respiratory failure.   HISTORY OF PRESENT ILLNESS: The patient is an 81 year old male with a history of lung cancer, being treated by protocol at [facility]. He is on an experimental protocol, which apparently was recently FDA approved. According to family, unable to give steroids with this medication. He presented to the Emergency Room with lethargy and unresponsiveness, where he was found to be in acute respiratory failure with hypercarbia. In the Emergency Room, the patient was started on BiPAP and is now admitted for further evaluation. He is lethargic and unable to give history.   PAST MEDICAL HISTORY: 1.  Lung cancer, on chemotherapy.  2.  Benign hypertension.  3.  Hyperlipidemia.  4.  Type 2 diabetes.  5.  Chronic renal insufficiency.  6.  ASCVD, status post percutaneous transluminal coronary angioplasty with stent placement.  7.  History of TIAs.  8.  Status post AAA repair.  9.  History of hematuria.   MEDICATIONS: 1.  Losartan 25 mg p.o. daily.  2.  Toprol-XL 100 mg p.o. daily.  3.  Tessalon 200 mg p.o. t.i.d.  4.  Advair 250/50, 1 puff b.i.d.  5.  NovoLog sliding scale insulin.  6.  Lantus 22 units subcutaneous at bedtime.  7.  Spiriva 1 capsule inhaled daily.  8.  Flonase 2 puffs in each nostril daily.  9.  DuoNeb SVNs t.i.d. p.r.n.  10.  ProAir 2 puffs q.4 hours p.r.n.  11.  Tussionex 1 tsp every 12 hours as needed.  12.  Xanax 0.25 mg p.o. daily.  13.  Lasix 20 mg p.o. daily as needed.  14.  Omeprazole 40 mg p.o. daily. 15.  Remeron 30 mg p.o. at bedtime.   ALLERGIES: PENICILLIN AND SULFA.   SOCIAL HISTORY: The patient quit smoking in 2000. No history of alcohol abuse.   FAMILY HISTORY: Positive for breast cancer, but otherwise unremarkable.   REVIEW OF  SYSTEMS: Unable to obtain.   PHYSICAL EXAMINATION: GENERAL: The patient is acutely ill-appearing, in moderate respiratory distress.  VITAL SIGNS: Currently remarkable for a blood pressure of 124/66 with a heart rate of 85, respiratory rate of 18, temperature of 97.9.  HEENT: Normocephalic, atraumatic. Pupils equally round and reactive to light and accommodation. Extraocular movements are intact. Sclerae are anicteric. Conjunctivae are clear. Oropharynx is dry but clear.  NECK: Supple without JVD. No adenopathy or thyromegaly is noted.  LUNGS: Reveal decreased breath sounds with dullness at the right base. No wheezes or rales. Respiratory effort is increased.  CARDIAC: Regular rate and rhythm with a normal S1, S2. No significant rubs or gallops. PMI is nondisplaced. Chest wall is nontender.  ABDOMEN: Soft, nontender with normoactive bowel sounds. No organomegaly or masses were appreciated. No hernias or bruits were noted.  EXTREMITIES: Without clubbing, cyanosis, edema. Pulses were 2+ bilaterally.  SKIN: Warm and dry without rash or lesions.  NEUROLOGIC: Cranial nerves II through XII grossly intact. Deep tendon reflexes were symmetric. Motor and sensory exams nonfocal.  PSYCHIATRIC: Unable to be performed due to the patient's mental status.   LABORATORY AND RADIOLOGICAL DATA: Chest x-ray revealed a large right pleural effusion with cardiomegaly. CT of the head was unremarkable. ABG on oxygen revealed a pCO2 of 116 with a sat of 97%. His glucose was 143 with CO2 of 41.  White count was 11.3 with a hemoglobin of 11.0.   ASSESSMENT: 1.  Acute respiratory failure with hypercarbia.  2.  Metabolic encephalopathy.  3.  Postobstructive pneumonia.  4.  Lung cancer.  5.  Type 2 diabetes.   PLAN: The patient will be admitted to the Intensive Care Unit as a full code. His code status will be further evaluated by family as urgently as possible. He will be maintained on BiPAP. Will follow his sugars with  Accu-Cheks before meals and at bedtime. Will begin IV fluids, IV antibiotics, DuoNeb SVNs. Will hold steroids due to his chemotherapy protocol contraindication. Will consult pulmonary urgently and oncology as well. We will also obtain a palliative care consult. Follow up routine labs in the morning. Further treatment and evaluation will depend upon the patient's progress. Prognosis is extremely poor.   TOTAL TIME SPENT ON THIS PATIENT: 50 minutes.    ____________________________ Leonie Douglas Doy Hutching, MD jds:jcm D: 10/08/2013 16:05:19 ET T: 10/08/2013 16:37:24 ET JOB#: [id number]  cc: Leonie Douglas. Doy Hutching, MD, <Dictator> Derinda Late, MD Sundance Moise Lennice Sites MD ELECTRONICALLY SIGNED 10/08/2013 17:44

## 2014-08-19 NOTE — Consult Note (Signed)
Brief Consult Note: Diagnosis: lung cancer, pleural effusion, exaccerbation of copd.   Patient was seen by consultant.   Consult note dictated.   Discussed with Attending MD.   Comments: see dictated note discussed with dr patel.  BRIEFLY, PATIENT ON NIVOLUMAB (OPDIVO) MONOCLONAL ANTIBODY TX FOR CHEMOTX RESISTANT LUNG CANCER, TX Q 2 WEEKS SINCE FEB, LAST DOSE 6/11. CARE RECENTLY TRANSFERRED FROM [place].  CURRENTLY, TX FOR PNEUMONIA,  HIS COPD EXACCERBATION IS BETTER, HIS PLEURAL FLUID IS REPORTEDLY STABLE, ALTHOUGH 02 REWUIREMENT RECENTLY HIGHER. CURRENT PLAN, TO F/U PLEURAL EFFUSION, THORACENTESIS PRN, THEN DECISION RE CONTINUING DRUG IF STABLE DISEASE. TODAY, WILL D/C DECADRON. AS DISCUSSED WITH DR PATEL. DOES NOT NEED PREDNISONE  NOW, IF NEEDED THER IS NO CONTRAINDICATION TO PREDNISONE OR SOLUMEDROL.  Electronic Signatures: Dallas Schimke (MD)  (Signed 14-Jun-15 12:22)  Authored: Brief Consult Note   Last Updated: 14-Jun-15 12:22 by Dallas Schimke (MD)

## 2014-08-19 NOTE — Consult Note (Signed)
PATIENT NAME:  [name] [name], [name] [name] MR#:  [id number] DATE OF BIRTH:  1930/12/06  DATE OF CONSULTATION:  10/09/2013  REFERRING PHYSICIAN:   CONSULTING PHYSICIAN:  Simonne Come. Inez Pilgrim, MD  HISTORY OF PRESENT ILLNESS:  Mr. Carswell is an 81 year old patient who was admitted on June 13th with respiratory failure, hypercapnia, confusion. He was put in the Intensive Care Unit. He was treated with steroids and antibiotics for hypercarbia, exacerbation of chronic obstructive pulmonary disease and pneumonia. He was started on BiPAP. He has improved. He was transferred out to the floor. He is now on 2 liters of nasal oxygen. His  lethargy and confusion is improved. He is back to normal baseline mental status. His pertinent history is he has non-small cell lung cancer, status post multiple therapies. In February, he went on treatment with Opdivo  monoclonal antibody, which at that time was on a trial and now is an FDA approved drug. He has recently transferred his care to Dr. Oliva Bustard in the [facility], where he was initially seen and he was due to be followed as an outpatient. His last dose of chemotherapy was Thursday, June 11th, it had been on a 2-week interval. He had not experienced any significant ill effects or needed dose adjustments. He has been documented to have air space disease and pleural fluid in the right lung. He, at his last office visit 3 days ago, was noted to have an increased oxygen requirement and put up to 2 liters of oxygen. He was normally only using oxygen at night. Subsequent to that, he became more acutely ill, leading to the events of hospitalization but of course now he is significantly improved. Currently his treatment has included high-dose intravenous Decadron, which will be okay to discontinue. He has had pulmonary consultation.  He is on Levaquin.    PAST MEDICAL HISTORY: Includes hypertension, hyperlipidemia, diabetes, chronic renal failure, coronary artery disease, history of transient  ischemic attacks, has had abdominal aortic aneurysm repair and is also listed as a history of hematuria, this taken from other providers and admission note. His primary physician is Dr. Baldemar Lenis. He has seen Dr.mohammed at [place] and Dr. Oliva Bustard in our [place].   MEDICATIONS AT THE TIME OF ADMISSION:  Losartan 25 mg daily, Toprol-XL 100 daily, Tessalon 3 times a day, Advair twice a day, NovoLog sliding scale, Lantus 22 units at night, Spiriva daily, Flonase 2 puffs each nostril daily, DuoNebs p.r.n., ProAir p.r.n. Tussionex every 12 hours, Xanax 0.25 daily, Lasix 20 daily, omeprazole 40 daily and Remeron 30 at night.   ALLERGIES: LISTED AS SULFA AND PENICILLIN.   SOCIAL HISTORY: He was a smoker, quit back 14 years ago, and no alcohol.   FAMILY HISTORY: Noncontributory to the current situation, is reviewed in prior [place] notes. There apparently is a family member with breast cancer.   SYSTEM REVIEW: He currently is feeling well. He does not have any headache, he is not dizzy. He is not short of breath at rest in the bed on the 2 liters oxygen. He has a minimal cough that is nonproductive. He has no chest pains. He has no abdominal pain, not complaining of  nausea, vomiting or diarrhea. He feels some general weakness. He has no headache. No dizziness. He feels that his strength, energy, alertness, mentation is back to baseline. No rash. No bruising. No increased edema. Denies focal weakness.   LABORATORY, DIAGNOSTIC AND RADIOLOGICAL DATA:  His x-ray initially showed a large pleural effusion. Head CT was  unremarkable. Review of x-rays indicates that fluid looks like it has been stable recently. His pCO2 was up to 116, this has improved since admission. His white count is 11.3, his hemoglobin is 11. He most recently on 2 liters of oxygen is 96% oxygen saturation and his vital signs are stable.  His chest x-ray from the 13th and  to today, repeat film, the pleural fluid is stable. His  creatinine is 1.31 today.  His hemoglobin is 10.7. His repeat blood gas at 1700 hours yesterday showed a pCO2 of 82. Blood cultures so far have no growth.   IMPRESSION AND PLAN: The patient is being treated for pneumonia. He is being treated for diabetes and his blood pressure. With regard to chronic obstructive pulmonary disease, he has no wheezing and he can stop his Decadron. It would be okay to start Solu-Medrol or p.o. prednisone with a taper but that is for Dr. Posey Pronto.  I am going to stop all steroids now, it does not seem that they are indicated. We could use his Port-A-Cath, there is no reason to use peripheral IV. The patient is being planned for thoracentesis, that decision is as per Dr. Mortimer Fries. A review of previous scans and patient's progress oxygen requirement would indicate that he may get improvement in oxygenation and possibly reduce his risk of recurrent respiratory failure by initially thoracentesis, potentially it might be diagnostic as well. The patient understands that the thoracentesis might have a short or longer duration of improvement. He did have a thoracentesis once in the past at the time of the initial diagnosis, is familiar with the procedure. With regard to chemotherapy, a decision will be made whether or not we have stable disease and would continue in 2 weeks. Prednisone is not contraindicated, it is avoided if there are no complications as to not reduce the immune effects but can be used p.r.n. for any chemotherapy complications. Nothing else acutely from oncology. Dr. Oliva Bustard ill continue to follow Mr. Inglis tomorrow and he can be followed by medicine and by pulmonary.    ____________________________ Simonne Come Inez Pilgrim, MD rgg:cs D: 10/09/2013 12:30:33 ET T: 10/09/2013 14:09:29 ET JOB#: [id number]  cc: Simonne Come. Inez Pilgrim, MD, <Dictator> Dallas Schimke MD ELECTRONICALLY SIGNED 11/10/2013 13:04

## 2014-08-19 NOTE — Discharge Summary (Signed)
PATIENT NAME:  [name] [name], [name] [name] MR#:  [id number] DATE OF BIRTH:  23-Oct-1930  DATE OF ADMISSION:  10/08/2013 DATE OF DISCHARGE:  10/12/2013  PRIMARY CARE PHYSICIAN: Dr. Baldemar Lenis.    DISCHARGE DIAGNOSES:  1. Acute encephalopathy due to severe hypercarbic respiratory failure  2. Chronic obstructive pulmonary disease exacerbation.  3. Possible postobstructive pneumonia.  4. Acute renal failure.  5. Large recurrent right pleural effusion in the setting of right lung squamous cell cancer.  6. Diabetes.   CONDITION: Stable.   CODE STATUS: FULL CODE.   HOME MEDICATIONS: Please refer to the medication reconciliation list.   HOME OXYGEN: The patient needs home oxygen 2 liters by nasal cannula.   DIET: Low-sodium, ADA diet.   ACTIVITY: As tolerated.   FOLLOWUP CARE: Follow up with PCP within 1 to 2 weeks. Follow up with Dr. Oliva Bustard within 1 to 2 weeks.   REASON FOR ADMISSION: Acute respiratory failure.   HOSPITAL COURSE: The patient is an 81 year old male with a history of lung cancer, COPD. Being treated at [facility]. Was sent to the ED due to unresponsiveness and lethargic. The patient was found to be in acute respiratory failure. Was placed on BiPAP and admitted to the hospital. For detailed history and physical examination, please refer to the admission note dictated by Dr. Doy Hutching. Laboratory data on admission date showed chest x-ray revealed a large right pleural effusion with cardiomegaly. CAT scan of the head was unremarkable. ABG showed pCO2 of 116 with O2 sat of 97%. WBC 11.3.  1. Acute respiratory failure with hypercarbia. The patient was admitted to the ICU. In addition to BiPAP, the patient was treated with IV antibiotics, DuoNebs and IV fluid support.  2. Acute on chronic respiratory failure due to COPD flare. The patient was on BiPAP and then changed to O2 by nasal cannula at 2 liters. The patient was treated with Decadron and then changed to prednisone taper. The patient was  suspected to have postobstructive pneumonia, treated with Levaquin.  3. Large recurrent right pleural effusion in the setting of right lung squamous cell cancer. Thoracentesis could not be done since there was not enough fluid per interventional radiology.   4. Acute renal failure. Improved with IV fluid support.   The patient's symptoms much improved. Dr. Oliva Bustard also evaluated the patient. He suggested to discharge the patient yesterday. The patient was clinically stable. He was discharged to home yesterday. I discussed the patient's discharge plan with the patient, the patient's family member, nurse, case Freight forwarder.   TIME SPENT: About 39 minutes.   ____________________________ Demetrios Loll, MD qc:gb D: 10/13/2013 15:27:51 ET T: 10/14/2013 02:02:51 ET JOB#: [id number]  cc: Demetrios Loll, MD, <Dictator> Demetrios Loll MD ELECTRONICALLY SIGNED 10/14/2013 17:20

## 2014-08-28 ENCOUNTER — Inpatient Hospital Stay: Payer: Medicare Other | Attending: Oncology

## 2014-08-28 VITALS — BP 108/64 | HR 86 | Temp 97.3°F | Resp 20

## 2014-08-28 DIAGNOSIS — Z794 Long term (current) use of insulin: Secondary | ICD-10-CM | POA: Diagnosis not present

## 2014-08-28 DIAGNOSIS — C3431 Malignant neoplasm of lower lobe, right bronchus or lung: Secondary | ICD-10-CM | POA: Diagnosis not present

## 2014-08-28 DIAGNOSIS — Z5111 Encounter for antineoplastic chemotherapy: Secondary | ICD-10-CM | POA: Insufficient documentation

## 2014-08-28 DIAGNOSIS — Z79899 Other long term (current) drug therapy: Secondary | ICD-10-CM | POA: Diagnosis not present

## 2014-08-28 DIAGNOSIS — E780 Pure hypercholesterolemia: Secondary | ICD-10-CM | POA: Diagnosis not present

## 2014-08-28 DIAGNOSIS — Z923 Personal history of irradiation: Secondary | ICD-10-CM | POA: Insufficient documentation

## 2014-08-28 DIAGNOSIS — Z85828 Personal history of other malignant neoplasm of skin: Secondary | ICD-10-CM | POA: Diagnosis not present

## 2014-08-28 DIAGNOSIS — I2510 Atherosclerotic heart disease of native coronary artery without angina pectoris: Secondary | ICD-10-CM | POA: Diagnosis not present

## 2014-08-28 DIAGNOSIS — C3490 Malignant neoplasm of unspecified part of unspecified bronchus or lung: Secondary | ICD-10-CM | POA: Insufficient documentation

## 2014-08-28 DIAGNOSIS — R54 Age-related physical debility: Secondary | ICD-10-CM | POA: Insufficient documentation

## 2014-08-28 DIAGNOSIS — E119 Type 2 diabetes mellitus without complications: Secondary | ICD-10-CM | POA: Insufficient documentation

## 2014-08-28 DIAGNOSIS — I10 Essential (primary) hypertension: Secondary | ICD-10-CM | POA: Diagnosis not present

## 2014-08-28 DIAGNOSIS — C3432 Malignant neoplasm of lower lobe, left bronchus or lung: Secondary | ICD-10-CM

## 2014-08-28 DIAGNOSIS — Z87891 Personal history of nicotine dependence: Secondary | ICD-10-CM | POA: Diagnosis not present

## 2014-08-28 DIAGNOSIS — I714 Abdominal aortic aneurysm, without rupture: Secondary | ICD-10-CM | POA: Diagnosis not present

## 2014-08-28 MED ORDER — LIDOCAINE-PRILOCAINE 2.5-2.5 % EX CREA: TOPICAL_CREAM | CUTANEOUS | Status: DC

## 2014-08-28 MED ORDER — SODIUM CHLORIDE 0.9 % IV SOLN
3.0000 mg/kg | Freq: Once | INTRAVENOUS | Status: AC
  Administered 2014-08-28: 260 mg via INTRAVENOUS
  Filled 2014-08-28: qty 20

## 2014-08-28 MED ORDER — SODIUM CHLORIDE 0.9 % IV SOLN
Freq: Once | INTRAVENOUS | Status: AC
  Administered 2014-08-28: 15:00:00 via INTRAVENOUS
  Filled 2014-08-28: qty 250

## 2014-08-28 MED ORDER — HEPARIN SOD (PORK) LOCK FLUSH 100 UNIT/ML IV SOLN
500.0000 [IU] | Freq: Once | INTRAVENOUS | Status: AC | PRN
  Administered 2014-08-28: 500 [IU]
  Filled 2014-08-28: qty 5

## 2014-08-28 MED ORDER — SODIUM CHLORIDE 0.9 % IJ SOLN
10.0000 mL | INTRAMUSCULAR | Status: DC | PRN
  Filled 2014-08-28: qty 10

## 2014-08-28 NOTE — Progress Notes (Unsigned)
Confirmed with Hildred Alamin RN no labs needed prior to [place] today.

## 2014-08-28 NOTE — Patient Instructions (Signed)
Nivolumab injection What is this medicine? NIVOLUMAB (nye VOL ue mab) is used to treat certain types of melanoma and lung cancer. This medicine may be used for other purposes; ask your health care provider or pharmacist if you have questions. COMMON BRAND NAME(S): Opdivo What should I tell my health care provider before I take this medicine? They need to know if you have any of these conditions: -eye disease, vision problems -history of pancreatitis -immune system problems -inflammatory bowel disease -kidney disease -liver disease -lung disease -lupus -myasthenia gravis -multiple sclerosis -organ transplant -stomach or intestine problems -thyroid disease -tingling of the fingers or toes, or other nerve disorder -an unusual or allergic reaction to nivolumab, other medicines, foods, dyes, or preservatives -pregnant or trying to get pregnant -breast-feeding How should I use this medicine? This medicine is for infusion into a vein. It is given by a health care professional in a hospital or clinic setting. A special MedGuide will be given to you before each treatment. Be sure to read this information carefully each time. Talk to your pediatrician regarding the use of this medicine in children. Special care may be needed. Overdosage: If you think you've taken too much of this medicine contact a poison control center or emergency room at once. Overdosage: If you think you have taken too much of this medicine contact a poison control center or emergency room at once. NOTE: This medicine is only for you. Do not share this medicine with others. What if I miss a dose? It is important not to miss your dose. Call your doctor or health care professional if you are unable to keep an appointment. What may interact with this medicine? Interactions have not been studied. This list may not describe all possible interactions. Give your health care provider a list of all the medicines, herbs,  non-prescription drugs, or dietary supplements you use. Also tell them if you smoke, drink alcohol, or use illegal drugs. Some items may interact with your medicine. What should I watch for while using this medicine? Tell your doctor or healthcare professional if your symptoms do not start to get better or if they get worse. Your condition will be monitored carefully while you are receiving this medicine. You may need blood work done while you are taking this medicine. What side effects may I notice from receiving this medicine? Side effects that you should report to your doctor or health care professional as soon as possible: -allergic reactions like skin rash, itching or hives, swelling of the face, lips, or tongue -black, tarry stools -bloody or watery diarrhea -changes in vision -chills -cough -depressed mood -eye pain -feeling anxious -fever -general ill feeling or flu-like symptoms -hair loss -loss of appetite -low blood counts - this medicine may decrease the number of white blood cells, red blood cells and platelets. You may be at increased risk for infections and bleeding -pain, tingling, numbness in the hands or feet -redness, blistering, peeling or loosening of the skin, including inside the mouth -red pinpoint spots on skin -signs of decreased platelets or bleeding - bruising, pinpoint red spots on the skin, black, tarry stools, blood in the urine -signs of decreased red blood cells - unusually weak or tired, feeling faint or lightheaded, falls -signs of infection - fever or chills, cough, sore throat, pain or trouble passing urine -signs and symptoms of a dangerous change in heartbeat or heart rhythm like chest pain; dizziness; fast or irregular heartbeat; palpitations; feeling faint or lightheaded, falls; breathing problems -signs   and symptoms of high blood sugar such as dizziness; dry mouth; dry skin; fruity breath; nausea; stomach pain; increased hunger or thirst; increased  urination -signs and symptoms of kidney injury like trouble passing urine or change in the amount of urine -signs and symptoms of liver injury like dark yellow or brown urine; general ill feeling or flu-like symptoms; light-colored stools; loss of appetite; nausea; right upper belly pain; unusually weak or tired; yellowing of the eyes or skin -signs and symptoms of increased potassium like muscle weakness; chest pain; or fast, irregular heartbeat -signs and symptoms of low potassium like muscle cramps or muscle pain; chest pain; dizziness; feeling faint or lightheaded, falls; palpitations; breathing problems; or fast, irregular heartbeat -swelling of the ankles, feet, hands -weight gainSide effects that usually do not require medical attention (report to your doctor or health care professional if they continue or are bothersome): -constipation -general ill feeling or flu-like symptoms -hair loss -loss of appetite -nausea, vomiting This list may not describe all possible side effects. Call your doctor for medical advice about side effects. You may report side effects to FDA at 1-800-FDA-1088. Where should I keep my medicine? This drug is given in a hospital or clinic and will not be stored at home. NOTE: This sheet is a summary. It may not cover all possible information. If you have questions about this medicine, talk to your doctor, pharmacist, or health care provider.  2015, Elsevier/Gold Standard. (2013-07-04 13:18:19)  

## 2014-09-11 ENCOUNTER — Inpatient Hospital Stay: Payer: Medicare Other

## 2014-09-11 ENCOUNTER — Other Ambulatory Visit: Payer: Self-pay | Admitting: *Deleted

## 2014-09-11 ENCOUNTER — Ambulatory Visit
Admission: RE | Admit: 2014-09-11 | Discharge: 2014-09-11 | Disposition: A | Discharge status: Home / Self Care | Payer: Medicare Other | Source: Ambulatory Visit | Attending: Oncology | Admitting: Oncology

## 2014-09-11 ENCOUNTER — Inpatient Hospital Stay (HOSPITAL_BASED_OUTPATIENT_CLINIC_OR_DEPARTMENT_OTHER): Payer: Medicare Other | Admitting: Oncology

## 2014-09-11 VITALS — BP 93/43 | HR 78 | Temp 98.2°F | Wt 189.6 lb

## 2014-09-11 DIAGNOSIS — Z87891 Personal history of nicotine dependence: Secondary | ICD-10-CM

## 2014-09-11 DIAGNOSIS — R54 Age-related physical debility: Secondary | ICD-10-CM

## 2014-09-11 DIAGNOSIS — E039 Hypothyroidism, unspecified: Secondary | ICD-10-CM

## 2014-09-11 DIAGNOSIS — C3490 Malignant neoplasm of unspecified part of unspecified bronchus or lung: Secondary | ICD-10-CM | POA: Diagnosis not present

## 2014-09-11 DIAGNOSIS — Z5111 Encounter for antineoplastic chemotherapy: Secondary | ICD-10-CM | POA: Diagnosis not present

## 2014-09-11 DIAGNOSIS — E119 Type 2 diabetes mellitus without complications: Secondary | ICD-10-CM

## 2014-09-11 DIAGNOSIS — Z79899 Other long term (current) drug therapy: Secondary | ICD-10-CM | POA: Diagnosis not present

## 2014-09-11 DIAGNOSIS — J9811 Atelectasis: Secondary | ICD-10-CM | POA: Insufficient documentation

## 2014-09-11 DIAGNOSIS — C3431 Malignant neoplasm of lower lobe, right bronchus or lung: Secondary | ICD-10-CM

## 2014-09-11 DIAGNOSIS — Z794 Long term (current) use of insulin: Secondary | ICD-10-CM

## 2014-09-11 DIAGNOSIS — I10 Essential (primary) hypertension: Secondary | ICD-10-CM

## 2014-09-11 DIAGNOSIS — J90 Pleural effusion, not elsewhere classified: Secondary | ICD-10-CM | POA: Diagnosis not present

## 2014-09-11 DIAGNOSIS — C3432 Malignant neoplasm of lower lobe, left bronchus or lung: Secondary | ICD-10-CM

## 2014-09-11 DIAGNOSIS — I714 Abdominal aortic aneurysm, without rupture: Secondary | ICD-10-CM

## 2014-09-11 DIAGNOSIS — E780 Pure hypercholesterolemia: Secondary | ICD-10-CM

## 2014-09-11 DIAGNOSIS — Z85828 Personal history of other malignant neoplasm of skin: Secondary | ICD-10-CM

## 2014-09-11 DIAGNOSIS — Z923 Personal history of irradiation: Secondary | ICD-10-CM

## 2014-09-11 DIAGNOSIS — I2510 Atherosclerotic heart disease of native coronary artery without angina pectoris: Secondary | ICD-10-CM

## 2014-09-11 LAB — COMPREHENSIVE METABOLIC PANEL
ALT: 15 U/L — AB (ref 17–63)
AST: 15 U/L (ref 15–41)
Albumin: 3.8 g/dL (ref 3.5–5.0)
Alkaline Phosphatase: 68 U/L (ref 38–126)
Anion gap: 7 (ref 5–15)
BILIRUBIN TOTAL: 0.5 mg/dL (ref 0.3–1.2)
BUN: 27 mg/dL — ABNORMAL HIGH (ref 6–20)
CHLORIDE: 103 mmol/L (ref 101–111)
CO2: 29 mmol/L (ref 22–32)
Calcium: 8.7 mg/dL — ABNORMAL LOW (ref 8.9–10.3)
Creatinine, Ser: 1.5 mg/dL — ABNORMAL HIGH (ref 0.61–1.24)
GFR, EST AFRICAN AMERICAN: 49 mL/min — AB (ref >60)
GFR, EST NON AFRICAN AMERICAN: 42 mL/min — AB (ref >60)
GLUCOSE: 228 mg/dL — AB (ref 65–99)
Potassium: 4.2 mmol/L (ref 3.5–5.1)
Sodium: 139 mmol/L (ref 135–145)
Total Protein: 6.5 g/dL (ref 6.5–8.1)

## 2014-09-11 LAB — CBC WITH DIFFERENTIAL/PLATELET
Basophils Absolute: 0 10*3/uL (ref 0–0.1)
Basophils Relative: 0 %
Eosinophils Absolute: 0.1 10*3/uL (ref 0–0.7)
HCT: 39.1 % — ABNORMAL LOW (ref 40.0–52.0)
HEMOGLOBIN: 12 g/dL — AB (ref 13.0–18.0)
Lymphocytes Relative: 3 %
Lymphs Abs: 0.3 10*3/uL — ABNORMAL LOW (ref 1.0–3.6)
MCH: 26.6 pg (ref 26.0–34.0)
MCHC: 30.7 g/dL — ABNORMAL LOW (ref 32.0–36.0)
MCV: 86.8 fL (ref 80.0–100.0)
Monocytes Absolute: 0.5 10*3/uL (ref 0.2–1.0)
NEUTROS ABS: 11 10*3/uL — AB (ref 1.4–6.5)
Neutrophils Relative %: 92 %
Platelets: 133 10*3/uL — ABNORMAL LOW (ref 150–440)
RBC: 4.51 MIL/uL (ref 4.40–5.90)
RDW: 15.5 % — ABNORMAL HIGH (ref 11.5–14.5)
WBC: 11.9 10*3/uL — ABNORMAL HIGH (ref 3.8–10.6)

## 2014-09-11 MED ORDER — HEPARIN SOD (PORK) LOCK FLUSH 100 UNIT/ML IV SOLN
500.0000 [IU] | Freq: Once | INTRAVENOUS | Status: AC | PRN
  Filled 2014-09-11: qty 5

## 2014-09-11 MED ORDER — SODIUM CHLORIDE 0.9 % IV SOLN
Freq: Once | INTRAVENOUS | Status: AC
  Administered 2014-09-11: 15:00:00 via INTRAVENOUS
  Filled 2014-09-11: qty 250

## 2014-09-11 MED ORDER — SODIUM CHLORIDE 0.9 % IV SOLN
3.0000 mg/kg | Freq: Once | INTRAVENOUS | Status: AC
  Administered 2014-09-11: 260 mg via INTRAVENOUS
  Filled 2014-09-11: qty 8

## 2014-09-11 MED ORDER — HYDROCODONE-CHLORPHENIRAMINE 5-4 MG/5ML PO SOLN
5.0000 mL | Freq: Two times a day (BID) | ORAL | Status: DC
Start: 2014-09-11 — End: 2014-11-16

## 2014-09-11 MED ORDER — SODIUM CHLORIDE 0.9 % IJ SOLN
10.0000 mL | INTRAMUSCULAR | Status: DC | PRN
  Administered 2014-09-11: 10 mL
  Filled 2014-09-11: qty 10

## 2014-09-16 ENCOUNTER — Encounter: Payer: Self-pay | Admitting: Oncology

## 2014-09-16 NOTE — Progress Notes (Signed)
[place] @ [facility] [phone]  Fax:([phone]) Enoch: May 23, 1930  MR#: [id number]  IAX#:[id number]  Patient Care Team: Derinda Late, MD as PCP - General (Family Medicine)  CHIEF COMPLAINT:  Chief Complaint  Patient presents with  . Follow-up    Oncology History   The patient is an 81 year old gentleman with stage IV squamous cell carcinoma of the lung who is seen for assessment prior to Nivolumab.  1. Squamous cell carcinoma of lung. Bronchoscopies positive for right lower lobe mass. Started on radiation therapy and chemotherapy.  August, 2012 Carboplatinum (AUC of 2) and Taxol with concurrent radiation therapy 2. Finished chemo /  radiation  in sept 2012. 3. Bronchoscopy was negative for any  malignant cells (August, 2013) 4. Continuing hemoptysis.  Repeat bronchoscopy is positive for malignant cells consistent with squamous cell carcinoma. 5. Palliative radiation and chemotherapy with carboplatinum and Taxol.(October, 2013) has finished radiation therapy and Taxol, carboplatinum in November of 2013 6.veristrat test is good 7.on Tarceva 8.progressing disease on Tarceva.  (January, 2015) discontinue Tarceva. 9.started on the investigational protocol with  [facility] February, 2015 10Patient is off protocol and continuing NIVO as. commercial product     Lung cancer   12/24/2010 Initial Diagnosis Lung cancer    Oncology Flowsheet 08/11/2013 08/25/2013 09/08/2013 09/22/2013 10/06/2013 08/28/2014 09/11/2014  INV-nivolumab (BMS [id number]) IV 3 mg/kg 3 mg/kg 3 mg/kg 3 mg/kg 3 mg/kg - -  nivolumab (OPDIVO) IV - - - - - 3 mg/kg 3 mg/kg   changes.  Was also reviewed with the patient and compared with the previous  INTERVAL HISTORY: 81 year old gentleman with squamous cell carcinoma of lung recurrent disease on NIVOLULAMAB.  Tolerating treatment very well without any significant nausea vomiting diarrhea.  Cough and shortness of breath is improved.  Here  for further follow-up and treatment consideration had a repeat chest x-ray which has been evaluated. Chest x-rays been independently reviewed and compared with the previous chest x-ray and so stable disease.  No rash.  No diarrhea.  REVIEW OF SYSTEMS:   Gen. status: Performance status is 1.  Lungs: Shortness of breath is improved cough is improved no hemoptysis cardiac: No chest pain no palpitation HEENT: No soreness in the marked no difficulty swallowing.  GI: Nausea no vomiting no diarrhea skin: No rash. Abdomen: No diarrhea. Musculoskeletal system no bony pains. Diabetes is under well control all the blood sugar values have been reviewed.  Lower extremity no swelling.  Patient does have senile tremors. Neurological system no headache or dizziness no other local symptoms.  All other systems have been reviewed  As per HPI. Otherwise, a complete review of systems is negatve.  PAST MEDICAL HISTORY: Past Medical History  Diagnosis Date  . Hypercholesteremia   . Hypertension   . Diabetes mellitus without complication   . AAA (abdominal aortic aneurysm)   . CAD (coronary artery disease)   . Lung cancer     09/2010  . Skin cancer     PAST SURGICAL HISTORY: Past Surgical History  Procedure Laterality Date  . Abdominal aortic aneurysm repair  2000  . Coronary stent placement  2000  . Tonsillectomy      when he was a child    FAMILY HISTORY Family History  Problem Relation Age of Onset  . Cancer Mother   . Cancer Sister   . Cancer Brother   . Cancer Maternal Aunt     Smoking History quit 10 years ago smoked 2  packs a day(1)  PFSH: Comments: family history of breast cancer and melanoma.  Comments: patient had smoked for 40 years one pack per day, quit smoking 10 years ago.  Does not drink  Additional Past Medical and Surgical History: coronary artery disease with stent placement.    Non-insulin-dependent diabetes.    Hypertension.    Multiple skin cancers removed including  melanoma.    Aneurysm repair.  Hypercholesteremia     ADVANCED DIRECTIVES: Patient does have advanced healthcare directive   HEALTH MAINTENANCE: History  Substance Use Topics  . Smoking status: Former Smoker    Types: Cigarettes, Cigars    Quit date: 04/29/2003  . Smokeless tobacco: Not on file  . Alcohol Use: No       Allergies  Allergen Reactions  . Penicillin G Rash  . Sulfa Antibiotics Rash    Current Outpatient Prescriptions  Medication Sig Dispense Refill  . ADVAIR DISKUS 250-50 MCG/DOSE AEPB Inhale 1 puff into the lungs. BID    . benzonatate (TESSALON) 200 MG capsule Take 200 mg by mouth 3 (three) times daily as needed for cough.    . fluticasone (FLONASE) 50 MCG/ACT nasal spray 1 spray. 1 spray in each nostril BID    . furosemide (LASIX) 20 MG tablet     . Hydrocodone-Chlorpheniramine 5-4 MG/5ML SOLN Take 5 mLs by mouth 2 (two) times daily. 240 mL 0  . insulin aspart (NOVOLOG) 100 UNIT/ML injection Inject into the skin as directed. Check before each meal, take as directed per sliding scale    . ipratropium-albuterol (DUONEB) 0.5-2.5 (3) MG/3ML SOLN Take 3 mLs by nebulization every 8 (eight) hours as needed.    Marland Kitchen LANTUS SOLOSTAR 100 UNIT/ML Solostar Pen 22 Units. 22 units at bedtime    . lidocaine-prilocaine (EMLA) cream Apply to affected area once 30 g 3  . losartan (COZAAR) 50 MG tablet Take 50 mg by mouth daily.    . metoprolol succinate (TOPROL-XL) 100 MG 24 hr tablet Take 100 mg by mouth daily.    Marland Kitchen nystatin (MYCOSTATIN) 100000 UNIT/ML suspension Take 5 mLs (500,000 Units total) by mouth 4 (four) times daily. 240 mL 0  . nystatin cream (MYCOSTATIN) 3 (three) times daily. Apply to affected area three times daily    . omeprazole (PRILOSEC) 40 MG capsule TAKE 1 CAPSULE (40 MG TOTAL) BY MOUTH DAILY. 30 capsule 0  . PROAIR HFA 108 (90 BASE) MCG/ACT inhaler Inhale 2 puffs into the lungs 4 (four) times daily as needed.    . ALPRAZolam (XANAX) 0.25 MG tablet Take 1  tablet (0.25 mg total) by mouth 2 (two) times daily. (Patient not taking: Reported on 09/11/2014) 30 tablet 0  . citalopram (CELEXA) 10 MG tablet     . mirtazapine (REMERON) 30 MG tablet Take 1 tablet (30 mg total) by mouth at bedtime. (Patient not taking: Reported on 09/11/2014) 30 tablet 2  . simvastatin (ZOCOR) 20 MG tablet Take 20 mg by mouth daily.    . tamsulosin (FLOMAX) 0.4 MG CAPS capsule Take 0.4 mg by mouth daily.  7   No current facility-administered medications for this visit.   Facility-Administered Medications Ordered in Other Visits  Medication Dose Route Frequency Provider Last Rate Last Dose  . sodium chloride 0.9 % injection 10 mL  10 mL Intracatheter PRN Forest Gleason, MD      . sodium chloride 0.9 % injection 10 mL  10 mL Intracatheter PRN Forest Gleason, MD   10 mL at 09/11/14 1405  OBJECTIVE:  Filed Vitals:   09/11/14 1421  BP: 93/43  Pulse: 78  Temp: 98.2 F (36.8 C)     Body mass index is 27.99 kg/(m^2).    ECOG FS:1 - Symptomatic but completely ambulatory  PHYSICAL EXAM: Goal status: Performance status is good.  Patient has not lost significant weight HEENT: No evidence of stomatitis.   Sclera and conjunctivae :: No jaundice.   pale looking . Lungs: Air entry diminished on the right side.  Dullness on percussion on the right side.  Occasional rhonchi on both sides.  Cardiac: Heart sounds are normal.  No pericardial rub.  No murmur. Lymphatic system: Cervical, axillary, inguinal, lymph nodes not palpable GI: Abdomen is soft.  No ascites.  Liver spleen not palpable.  No tenderness.  Bowel sounds are within normal limit Lower extremity: No edema Neurological system: Higher functions, cranial nerves intact No evidence of peripheral neuropathy. Skin: No rash.  No ecchymosis.. Musculoskeletal system within normal limit    LAB RESULTS:  Infusion on 09/11/2014  Component Date Value Ref Range Status  . WBC 09/11/2014 11.9* 3.8 - 10.6 K/uL Final   A-LINE  DRAW  . RBC 09/11/2014 4.51  4.40 - 5.90 MIL/uL Final  . Hemoglobin 09/11/2014 12.0* 13.0 - 18.0 g/dL Final   RESULT REPEATED AND VERIFIED  . HCT 09/11/2014 39.1* 40.0 - 52.0 % Final   RESULT REPEATED AND VERIFIED  . MCV 09/11/2014 86.8  80.0 - 100.0 fL Final  . MCH 09/11/2014 26.6  26.0 - 34.0 pg Final  . MCHC 09/11/2014 30.7* 32.0 - 36.0 g/dL Final  . RDW 09/11/2014 15.5* 11.5 - 14.5 % Final  . Platelets 09/11/2014 133* 150 - 440 K/uL Final  . Neutrophils Relative % 09/11/2014 92%   Final  . Neutro Abs 09/11/2014 11.0* 1.4 - 6.5 K/uL Final  . Lymphocytes Relative 09/11/2014 3%   Final  . Lymphs Abs 09/11/2014 0.3* 1.0 - 3.6 K/uL Final  . Monocytes Relative 09/11/2014 4%   Final  . Monocytes Absolute 09/11/2014 0.5  0.2 - 1.0 K/uL Final  . Eosinophils Relative 09/11/2014 1%   Final  . Eosinophils Absolute 09/11/2014 0.1  0 - 0.7 K/uL Final  . Basophils Relative 09/11/2014 0%   Final  . Basophils Absolute 09/11/2014 0.0  0 - 0.1 K/uL Final  . Sodium 09/11/2014 139  135 - 145 mmol/L Final  . Potassium 09/11/2014 4.2  3.5 - 5.1 mmol/L Final  . Chloride 09/11/2014 103  101 - 111 mmol/L Final  . CO2 09/11/2014 29  22 - 32 mmol/L Final  . Glucose, Bld 09/11/2014 228* 65 - 99 mg/dL Final  . BUN 09/11/2014 27* 6 - 20 mg/dL Final  . Creatinine, Ser 09/11/2014 1.50* 0.61 - 1.24 mg/dL Final  . Calcium 09/11/2014 8.7* 8.9 - 10.3 mg/dL Final  . Total Protein 09/11/2014 6.5  6.5 - 8.1 g/dL Final  . Albumin 09/11/2014 3.8  3.5 - 5.0 g/dL Final  . AST 09/11/2014 15  15 - 41 U/L Final  . ALT 09/11/2014 15* 17 - 63 U/L Final  . Alkaline Phosphatase 09/11/2014 68  38 - 126 U/L Final  . Total Bilirubin 09/11/2014 0.5  0.3 - 1.2 mg/dL Final  . GFR calc non Af Amer 09/11/2014 42* >60 mL/min Final  . GFR calc Af Amer 09/11/2014 49* >60 mL/min Final   Comment: (NOTE) The eGFR has been calculated using the CKD EPI equation. This calculation has not been validated in all clinical situations. eGFR's  persistently <60 mL/min signify possible Chronic Kidney Disease.   . Anion gap 09/11/2014 7  5 - 15 Final      STUDIES: Dg Chest 2 View  09/11/2014   CLINICAL DATA:  Lung tumor.  EXAM: CHEST  2 VIEW  COMPARISON:  PET-CT 07/13/2014.  Chest x-ray 05/08/2014.  FINDINGS: Port-A-Cath noted with tip projected over superior vena cava. Stable prominent atelectasis and right pleural effusion. Underlying tumor cannot be excluded. Represent made to prior PET-CT of 07/13/2014. Left lung is clear. Heart size stable.  IMPRESSION: Stable positioning of Port-A-Cath. Stable right lower lobe atelectasis and/or mass lesion with of right pleural effusion. No interim change. Represent made to prior PET-CT report 07/13/2014 .   Electronically Signed   By: Marcello Moores  Register   On: 09/11/2014 14:23    ASSESSMENT: Recurrent squamous cell carcinoma of right lower lobe. COPD Diabetes insulin-dependent Senile tremors(  MEDICAL DECISION MAKING:  Chest x-ray has been reviewed independently shows stable disease.  Reviewed with the patient and family.  All the blood sugar data has been reviewed.  We will continue NIVOLULAMAB 2.  Possibility for reevaluation with PET scan incoming next few months.  Patient expressed understanding and was in agreement with this plan. He also understands that He can call clinic at any time with any questions, concerns, or complaints.    Lung cancer   Staging form: Lung, AJCC 7th Edition     Clinical: Stage IV (T3, N2, M1b) - Signed by Curt Bears, MD on 05/25/2013     Pathologic: No stage assigned - Marni Griffon, MD   09/16/2014 2:51 PM

## 2014-09-22 ENCOUNTER — Telehealth: Payer: Self-pay | Admitting: *Deleted

## 2014-09-22 DIAGNOSIS — E139 Other specified diabetes mellitus without complications: Secondary | ICD-10-CM

## 2014-09-22 MED ORDER — INSULIN LISPRO 100 UNIT/ML ~~LOC~~ SOLN: SUBCUTANEOUS | Status: DC

## 2014-09-22 MED ORDER — INSULIN ISOPHANE HUMAN 100 UNIT/ML KWIKPEN: PEN_INJECTOR | SUBCUTANEOUS | Status: DC

## 2014-09-22 NOTE — Telephone Encounter (Signed)
Needs the insulin Pen, "It's all I can use". Called into [facility] to order Pen

## 2014-09-22 NOTE — Telephone Encounter (Signed)
Per patient's wife, insurance will not cover novolog. Prescription changed to humalog. Pt's wife notified.

## 2014-09-26 ENCOUNTER — Inpatient Hospital Stay: Payer: Medicare Other

## 2014-09-26 VITALS — BP 108/65 | HR 80 | Temp 98.3°F | Resp 18

## 2014-09-26 DIAGNOSIS — Z5111 Encounter for antineoplastic chemotherapy: Secondary | ICD-10-CM | POA: Diagnosis not present

## 2014-09-26 DIAGNOSIS — C3490 Malignant neoplasm of unspecified part of unspecified bronchus or lung: Secondary | ICD-10-CM

## 2014-09-26 MED ORDER — SODIUM CHLORIDE 0.9 % IV SOLN
3.0000 mg/kg | Freq: Once | INTRAVENOUS | Status: AC
  Administered 2014-09-26: 260 mg via INTRAVENOUS
  Filled 2014-09-26: qty 20

## 2014-09-26 MED ORDER — SODIUM CHLORIDE 0.9 % IJ SOLN
10.0000 mL | INTRAMUSCULAR | Status: AC | PRN
  Administered 2014-09-26: 10 mL
  Filled 2014-09-26: qty 10

## 2014-09-26 MED ORDER — SODIUM CHLORIDE 0.9 % IV SOLN
Freq: Once | INTRAVENOUS | Status: AC
  Administered 2014-09-26: 14:00:00 via INTRAVENOUS
  Filled 2014-09-26: qty 1000

## 2014-09-26 MED ORDER — HEPARIN SOD (PORK) LOCK FLUSH 100 UNIT/ML IV SOLN
500.0000 [IU] | Freq: Once | INTRAVENOUS | Status: AC | PRN
  Administered 2014-09-26: 500 [IU]
  Filled 2014-09-26: qty 5

## 2014-10-09 ENCOUNTER — Inpatient Hospital Stay: Payer: Medicare Other | Attending: Oncology | Admitting: Oncology

## 2014-10-09 ENCOUNTER — Ambulatory Visit: Payer: Medicare Other

## 2014-10-09 ENCOUNTER — Inpatient Hospital Stay: Payer: Medicare Other

## 2014-10-09 VITALS — BP 119/68 | HR 86 | Temp 98.9°F | Resp 20 | Wt 187.8 lb

## 2014-10-09 DIAGNOSIS — Z794 Long term (current) use of insulin: Secondary | ICD-10-CM | POA: Diagnosis not present

## 2014-10-09 DIAGNOSIS — E119 Type 2 diabetes mellitus without complications: Secondary | ICD-10-CM | POA: Diagnosis not present

## 2014-10-09 DIAGNOSIS — Z923 Personal history of irradiation: Secondary | ICD-10-CM | POA: Diagnosis not present

## 2014-10-09 DIAGNOSIS — C3431 Malignant neoplasm of lower lobe, right bronchus or lung: Secondary | ICD-10-CM | POA: Diagnosis not present

## 2014-10-09 DIAGNOSIS — I714 Abdominal aortic aneurysm, without rupture: Secondary | ICD-10-CM | POA: Diagnosis not present

## 2014-10-09 DIAGNOSIS — Z5111 Encounter for antineoplastic chemotherapy: Secondary | ICD-10-CM | POA: Diagnosis present

## 2014-10-09 DIAGNOSIS — Z79899 Other long term (current) drug therapy: Secondary | ICD-10-CM | POA: Diagnosis not present

## 2014-10-09 DIAGNOSIS — R251 Tremor, unspecified: Secondary | ICD-10-CM | POA: Insufficient documentation

## 2014-10-09 DIAGNOSIS — I2510 Atherosclerotic heart disease of native coronary artery without angina pectoris: Secondary | ICD-10-CM | POA: Insufficient documentation

## 2014-10-09 DIAGNOSIS — J449 Chronic obstructive pulmonary disease, unspecified: Secondary | ICD-10-CM | POA: Diagnosis not present

## 2014-10-09 DIAGNOSIS — Z85828 Personal history of other malignant neoplasm of skin: Secondary | ICD-10-CM

## 2014-10-09 DIAGNOSIS — C3432 Malignant neoplasm of lower lobe, left bronchus or lung: Secondary | ICD-10-CM

## 2014-10-09 DIAGNOSIS — I10 Essential (primary) hypertension: Secondary | ICD-10-CM

## 2014-10-09 DIAGNOSIS — Z87891 Personal history of nicotine dependence: Secondary | ICD-10-CM | POA: Diagnosis not present

## 2014-10-09 DIAGNOSIS — E780 Pure hypercholesterolemia: Secondary | ICD-10-CM

## 2014-10-09 DIAGNOSIS — E039 Hypothyroidism, unspecified: Secondary | ICD-10-CM

## 2014-10-09 DIAGNOSIS — C3490 Malignant neoplasm of unspecified part of unspecified bronchus or lung: Secondary | ICD-10-CM

## 2014-10-09 DIAGNOSIS — Z809 Family history of malignant neoplasm, unspecified: Secondary | ICD-10-CM | POA: Diagnosis not present

## 2014-10-09 LAB — CBC WITH DIFFERENTIAL/PLATELET
BASOS PCT: 1 %
Basophils Absolute: 0.2 10*3/uL — ABNORMAL HIGH (ref 0–0.1)
EOS ABS: 0.1 10*3/uL (ref 0–0.7)
EOS PCT: 1 %
HCT: 39.5 % — ABNORMAL LOW (ref 40.0–52.0)
Hemoglobin: 12.4 g/dL — ABNORMAL LOW (ref 13.0–18.0)
LYMPHS PCT: 3 %
Lymphs Abs: 0.3 10*3/uL — ABNORMAL LOW (ref 1.0–3.6)
MCH: 27.1 pg (ref 26.0–34.0)
MCHC: 31.4 g/dL — ABNORMAL LOW (ref 32.0–36.0)
MCV: 86.5 fL (ref 80.0–100.0)
MONO ABS: 0.7 10*3/uL (ref 0.2–1.0)
Monocytes Relative: 7 %
Neutro Abs: 9.5 10*3/uL — ABNORMAL HIGH (ref 1.4–6.5)
Neutrophils Relative %: 88 %
Platelets: 154 10*3/uL (ref 150–440)
RBC: 4.57 MIL/uL (ref 4.40–5.90)
RDW: 16.3 % — AB (ref 11.5–14.5)
WBC: 10.8 10*3/uL — AB (ref 3.8–10.6)

## 2014-10-09 LAB — COMPREHENSIVE METABOLIC PANEL
ALT: 16 U/L — ABNORMAL LOW (ref 17–63)
ANION GAP: 8 (ref 5–15)
AST: 18 U/L (ref 15–41)
Albumin: 3.8 g/dL (ref 3.5–5.0)
Alkaline Phosphatase: 63 U/L (ref 38–126)
BUN: 26 mg/dL — ABNORMAL HIGH (ref 6–20)
CALCIUM: 9 mg/dL (ref 8.9–10.3)
CHLORIDE: 102 mmol/L (ref 101–111)
CO2: 29 mmol/L (ref 22–32)
CREATININE: 1.55 mg/dL — AB (ref 0.61–1.24)
GFR calc Af Amer: 47 mL/min — ABNORMAL LOW (ref >60)
GFR, EST NON AFRICAN AMERICAN: 40 mL/min — AB (ref >60)
GLUCOSE: 247 mg/dL — AB (ref 65–99)
Potassium: 4 mmol/L (ref 3.5–5.1)
Sodium: 139 mmol/L (ref 135–145)
Total Bilirubin: 0.7 mg/dL (ref 0.3–1.2)
Total Protein: 6.7 g/dL (ref 6.5–8.1)

## 2014-10-09 LAB — TSH: TSH: 1.116 u[IU]/mL (ref 0.350–4.500)

## 2014-10-09 MED ORDER — HEPARIN SOD (PORK) LOCK FLUSH 100 UNIT/ML IV SOLN
500.0000 [IU] | Freq: Once | INTRAVENOUS | Status: AC | PRN
  Administered 2014-10-09: 500 [IU]

## 2014-10-09 MED ORDER — SODIUM CHLORIDE 0.9 % IV SOLN
3.0000 mg/kg | Freq: Once | INTRAVENOUS | Status: AC
  Administered 2014-10-09: 260 mg via INTRAVENOUS
  Filled 2014-10-09: qty 20

## 2014-10-09 MED ORDER — SODIUM CHLORIDE 0.9 % IJ SOLN
10.0000 mL | INTRAMUSCULAR | Status: DC | PRN
  Administered 2014-10-09: 10 mL
  Filled 2014-10-09: qty 10

## 2014-10-09 MED ORDER — SODIUM CHLORIDE 0.9 % IV SOLN
Freq: Once | INTRAVENOUS | Status: AC
  Administered 2014-10-09: 15:00:00 via INTRAVENOUS
  Filled 2014-10-09: qty 250

## 2014-10-10 LAB — T4: T4, Total: 6.7 ug/dL (ref 4.5–12.0)

## 2014-10-13 NOTE — Progress Notes (Signed)
[place] @ [facility] [phone]  Fax:([phone]) [place]: April 07, 1931  MR#: [id number]  VVK#:[id number]  Patient Care Team: Derinda Late, MD as PCP - General (Family Medicine)  CHIEF COMPLAINT:  Chief Complaint  Patient presents with  . Follow-up    Malignant neoplasm of lower lobe of right lung     Oncology History   The patient is an 81 year old gentleman with stage IV squamous cell carcinoma of the lung who is seen for assessment prior to Nivolumab.  1. Squamous cell carcinoma of lung. Bronchoscopies positive for right lower lobe mass. Started on radiation therapy and chemotherapy.  August, 2012 Carboplatinum (AUC of 2) and Taxol with concurrent radiation therapy 2. Finished chemo /  radiation  in sept 2012. 3. Bronchoscopy was negative for any  malignant cells (August, 2013) 4. Continuing hemoptysis.  Repeat bronchoscopy is positive for malignant cells consistent with squamous cell carcinoma. 5. Palliative radiation and chemotherapy with carboplatinum and Taxol.(October, 2013) has finished radiation therapy and Taxol, carboplatinum in November of 2013 6.veristrat test is good 7.on Tarceva 8.progressing disease on Tarceva.  (January, 2015) discontinue Tarceva. 9.started on the investigational protocol with  [facility] February, 2015 10Patient is off protocol and continuing NIVO as. commercial product     Lung cancer   12/24/2010 Initial Diagnosis Lung cancer    Oncology Flowsheet 09/08/2013 09/22/2013 10/06/2013 08/28/2014 09/11/2014 09/26/2014 10/09/2014  INV-nivolumab (BMS [id number]) IV 3 mg/kg 3 mg/kg 3 mg/kg - - - -  nivolumab (OPDIVO) IV - - - 3 mg/kg 3 mg/kg 3 mg/kg 3 mg/kg   changes.  Was also reviewed with the patient and compared with the previous  INTERVAL HISTORY: 81 year old gentleman with squamous cell carcinoma of lung recurrent disease returns to clinic for his next infusion of nivolumab.  He is tolerating his treatments very well  without any significant fax. He denies any diarrhea. Cough and shortness of breath is improved.  He denies any fevers. He has no neurologic complaints. Patient otherwise feels well and offers no further specific complaints today.  REVIEW OF SYSTEMS:   Gen. status: Performance status is 1.   Lungs: Shortness of breath is improved cough is improved no hemoptysis  Cardiac: No chest pain no palpitation  GI: Nausea no vomiting no diarrhea skin: No rash. Abdomen: No diarrhea. Musculoskeletal system no bony pains. Diabetes is under well control all the blood sugar values have been reviewed.  Lower extremity no swelling.  Patient does have senile tremors. Neurological system no headache or dizziness no other local symptoms.    As per HPI. Otherwise, a complete review of systems is negatve.  PAST MEDICAL HISTORY: Past Medical History  Diagnosis Date  . Hypercholesteremia   . Hypertension   . Diabetes mellitus without complication   . AAA (abdominal aortic aneurysm)   . CAD (coronary artery disease)   . Lung cancer     09/2010  . Skin cancer     PAST SURGICAL HISTORY: Past Surgical History  Procedure Laterality Date  . Abdominal aortic aneurysm repair  2000  . Coronary stent placement  2000  . Tonsillectomy      when he was a child    FAMILY HISTORY Family History  Problem Relation Age of Onset  . Cancer Mother   . Cancer Sister   . Cancer Brother   . Cancer Maternal Aunt     Smoking History quit 10 years ago smoked 2 packs a day(1)  PFSH: Comments: family  history of breast cancer and melanoma.  Comments: patient had smoked for 40 years one pack per day, quit smoking 10 years ago.  Does not drink  Additional Past Medical and Surgical History: coronary artery disease with stent placement.    Non-insulin-dependent diabetes.    Hypertension.    Multiple skin cancers removed including melanoma.    Aneurysm repair.  Hypercholesteremia     ADVANCED DIRECTIVES: Patient does  have advanced healthcare directive   HEALTH MAINTENANCE: History  Substance Use Topics  . Smoking status: Former Smoker    Types: Cigarettes, Cigars    Quit date: 04/29/2003  . Smokeless tobacco: Not on file  . Alcohol Use: No       Allergies  Allergen Reactions  . Penicillin G Rash  . Sulfa Antibiotics Rash    Current Outpatient Prescriptions  Medication Sig Dispense Refill  . ADVAIR DISKUS 250-50 MCG/DOSE AEPB Inhale 1 puff into the lungs. BID    . ALPRAZolam (XANAX) 0.25 MG tablet Take 1 tablet (0.25 mg total) by mouth 2 (two) times daily. 30 tablet 0  . benzonatate (TESSALON) 200 MG capsule Take 200 mg by mouth 3 (three) times daily as needed for cough.    . citalopram (CELEXA) 10 MG tablet     . fluticasone (FLONASE) 50 MCG/ACT nasal spray 1 spray. 1 spray in each nostril BID    . furosemide (LASIX) 20 MG tablet     . HUMALOG KWIKPEN 100 UNIT/ML KiwkPen CHECK BLOOD SUGAR BEFORE EACH MEAL. INJECT UNDER THE SKIN PER SLIDING SCALE. MAX 30 U PER DAY  2  . Hydrocodone-Chlorpheniramine 5-4 MG/5ML SOLN Take 5 mLs by mouth 2 (two) times daily. 240 mL 0  . ipratropium-albuterol (DUONEB) 0.5-2.5 (3) MG/3ML SOLN Take 3 mLs by nebulization every 8 (eight) hours as needed.    Marland Kitchen LANTUS SOLOSTAR 100 UNIT/ML Solostar Pen 22 Units. 22 units at bedtime    . lidocaine-prilocaine (EMLA) cream Apply to affected area once 30 g 3  . losartan (COZAAR) 50 MG tablet Take 50 mg by mouth daily.    . metoprolol succinate (TOPROL-XL) 100 MG 24 hr tablet Take 100 mg by mouth daily.    . mirtazapine (REMERON) 30 MG tablet Take 1 tablet (30 mg total) by mouth at bedtime. 30 tablet 2  . omeprazole (PRILOSEC) 40 MG capsule TAKE 1 CAPSULE (40 MG TOTAL) BY MOUTH DAILY. 30 capsule 0  . predniSONE (DELTASONE) 10 MG tablet 1 TAB(S) ORALLY ONCE A DAY  3  . PROAIR HFA 108 (90 BASE) MCG/ACT inhaler Inhale 2 puffs into the lungs 4 (four) times daily as needed.    . simvastatin (ZOCOR) 20 MG tablet Take 20 mg by  mouth daily.    Marland Kitchen SPIRIVA HANDIHALER 18 MCG inhalation capsule INHALE 1 CAPSULE WITH HANDIHALER ONCE A DAY  5  . tamsulosin (FLOMAX) 0.4 MG CAPS capsule Take 0.4 mg by mouth daily.  7   No current facility-administered medications for this visit.   Facility-Administered Medications Ordered in Other Visits  Medication Dose Route Frequency Provider Last Rate Last Dose  . sodium chloride 0.9 % injection 10 mL  10 mL Intracatheter PRN Forest Gleason, MD      . sodium chloride 0.9 % injection 10 mL  10 mL Intracatheter PRN Forest Gleason, MD   10 mL at 09/11/14 1405  . sodium chloride 0.9 % injection 10 mL  10 mL Intracatheter PRN Forest Gleason, MD   10 mL at 09/26/14 1405  OBJECTIVE:  Filed Vitals:   10/09/14 1355  BP: 119/68  Pulse: 86  Temp: 98.9 F (37.2 C)  Resp: 20     Body mass index is 27.73 kg/(m^2).    ECOG FS:1 - Symptomatic but completely ambulatory  PHYSICAL EXAM: Gen.: No acute distress.  HEENT: No evidence of stomatitis. Lungs: Air entry diminished on the right side.  Dullness on percussion on the right side.  Occasional rhonchi on both sides.   Cardiac: Heart sounds are normal.  No pericardial rub.  No murmur. GI: Abdomen is soft.  No ascites.  Liver spleen not palpable.  No tenderness.  Bowel sounds are within normal limit Lower extremity: No edema Neurological system: Higher functions, cranial nerves intact No evidence of peripheral neuropathy. Skin: No rash.  No ecchymosis.. Musculoskeletal system within normal limit    LAB RESULTS:  Infusion on 10/09/2014  Component Date Value Ref Range Status  . WBC 10/09/2014 10.8* 3.8 - 10.6 K/uL Final   A-LINE DRAW  . RBC 10/09/2014 4.57  4.40 - 5.90 MIL/uL Final  . Hemoglobin 10/09/2014 12.4* 13.0 - 18.0 g/dL Final  . HCT 10/09/2014 39.5* 40.0 - 52.0 % Final  . MCV 10/09/2014 86.5  80.0 - 100.0 fL Final  . MCH 10/09/2014 27.1  26.0 - 34.0 pg Final  . MCHC 10/09/2014 31.4* 32.0 - 36.0 g/dL Final  . RDW 10/09/2014  16.3* 11.5 - 14.5 % Final  . Platelets 10/09/2014 154  150 - 440 K/uL Final  . Neutrophils Relative % 10/09/2014 88   Final  . Neutro Abs 10/09/2014 9.5* 1.4 - 6.5 K/uL Final  . Lymphocytes Relative 10/09/2014 3   Final  . Lymphs Abs 10/09/2014 0.3* 1.0 - 3.6 K/uL Final  . Monocytes Relative 10/09/2014 7   Final  . Monocytes Absolute 10/09/2014 0.7  0.2 - 1.0 K/uL Final  . Eosinophils Relative 10/09/2014 1   Final  . Eosinophils Absolute 10/09/2014 0.1  0 - 0.7 K/uL Final  . Basophils Relative 10/09/2014 1   Final  . Basophils Absolute 10/09/2014 0.2* 0 - 0.1 K/uL Final  . Sodium 10/09/2014 139  135 - 145 mmol/L Final  . Potassium 10/09/2014 4.0  3.5 - 5.1 mmol/L Final  . Chloride 10/09/2014 102  101 - 111 mmol/L Final  . CO2 10/09/2014 29  22 - 32 mmol/L Final  . Glucose, Bld 10/09/2014 247* 65 - 99 mg/dL Final  . BUN 10/09/2014 26* 6 - 20 mg/dL Final  . Creatinine, Ser 10/09/2014 1.55* 0.61 - 1.24 mg/dL Final  . Calcium 10/09/2014 9.0  8.9 - 10.3 mg/dL Final  . Total Protein 10/09/2014 6.7  6.5 - 8.1 g/dL Final  . Albumin 10/09/2014 3.8  3.5 - 5.0 g/dL Final  . AST 10/09/2014 18  15 - 41 U/L Final  . ALT 10/09/2014 16* 17 - 63 U/L Final  . Alkaline Phosphatase 10/09/2014 63  38 - 126 U/L Final  . Total Bilirubin 10/09/2014 0.7  0.3 - 1.2 mg/dL Final  . GFR calc non Af Amer 10/09/2014 40* >60 mL/min Final  . GFR calc Af Amer 10/09/2014 47* >60 mL/min Final   Comment: (NOTE) The eGFR has been calculated using the CKD EPI equation. This calculation has not been validated in all clinical situations. eGFR's persistently <60 mL/min signify possible Chronic Kidney Disease.   . Anion gap 10/09/2014 8  5 - 15 Final  . T4, Total 10/09/2014 6.7  4.5 - 12.0 ug/dL Final   Comment: (NOTE) Performed At:  [facility] [place], [place] [id number] Lindon Romp MD [id number]   . TSH 10/09/2014 1.116  0.350 - 4.500 uIU/mL Final      STUDIES: No results  found.  ASSESSMENT: Recurrent squamous cell carcinoma of right lower lobe. COPD Diabetes insulin-dependent Senile tremors(  MEDICAL DECISION MAKING:  Chest x-ray has been reviewed independently shows stable disease.  Proceed with his next infusion of NIVOLULAMAB. Return to clinic in 2 weeks for further evaluation, repeat laboratory work and consideration of continuation of treatment..  Patient expressed understanding and was in agreement with this plan. He also understands that He can call clinic at any time with any questions, concerns, or complaints.    Lung cancer   Staging form: Lung, AJCC 7th Edition     Clinical: Stage IV (T3, N2, M1b) - Signed by Curt Bears, MD on 05/25/2013     Pathologic: No stage assigned - Unsigned   Lloyd Huger, MD   10/13/2014 1:06 PM

## 2014-10-16 ENCOUNTER — Telehealth: Payer: Self-pay | Admitting: *Deleted

## 2014-10-16 NOTE — Telephone Encounter (Signed)
Agree to call back if gets worse or does not get better

## 2014-10-17 ENCOUNTER — Other Ambulatory Visit: Payer: Self-pay | Admitting: Oncology

## 2014-10-23 ENCOUNTER — Inpatient Hospital Stay: Payer: Medicare Other

## 2014-10-23 ENCOUNTER — Inpatient Hospital Stay (HOSPITAL_BASED_OUTPATIENT_CLINIC_OR_DEPARTMENT_OTHER): Payer: Medicare Other | Admitting: Oncology

## 2014-10-23 VITALS — BP 101/61 | HR 105 | Temp 100.0°F | Wt 184.7 lb

## 2014-10-23 DIAGNOSIS — E780 Pure hypercholesterolemia: Secondary | ICD-10-CM

## 2014-10-23 DIAGNOSIS — C3431 Malignant neoplasm of lower lobe, right bronchus or lung: Secondary | ICD-10-CM

## 2014-10-23 DIAGNOSIS — R251 Tremor, unspecified: Secondary | ICD-10-CM | POA: Diagnosis not present

## 2014-10-23 DIAGNOSIS — E119 Type 2 diabetes mellitus without complications: Secondary | ICD-10-CM

## 2014-10-23 DIAGNOSIS — I10 Essential (primary) hypertension: Secondary | ICD-10-CM

## 2014-10-23 DIAGNOSIS — Z794 Long term (current) use of insulin: Secondary | ICD-10-CM

## 2014-10-23 DIAGNOSIS — Z923 Personal history of irradiation: Secondary | ICD-10-CM

## 2014-10-23 DIAGNOSIS — J449 Chronic obstructive pulmonary disease, unspecified: Secondary | ICD-10-CM

## 2014-10-23 DIAGNOSIS — C3490 Malignant neoplasm of unspecified part of unspecified bronchus or lung: Secondary | ICD-10-CM

## 2014-10-23 DIAGNOSIS — I2510 Atherosclerotic heart disease of native coronary artery without angina pectoris: Secondary | ICD-10-CM

## 2014-10-23 DIAGNOSIS — Z809 Family history of malignant neoplasm, unspecified: Secondary | ICD-10-CM

## 2014-10-23 DIAGNOSIS — Z5111 Encounter for antineoplastic chemotherapy: Secondary | ICD-10-CM | POA: Diagnosis not present

## 2014-10-23 DIAGNOSIS — Z85828 Personal history of other malignant neoplasm of skin: Secondary | ICD-10-CM

## 2014-10-23 DIAGNOSIS — Z87891 Personal history of nicotine dependence: Secondary | ICD-10-CM

## 2014-10-23 DIAGNOSIS — I714 Abdominal aortic aneurysm, without rupture: Secondary | ICD-10-CM

## 2014-10-23 DIAGNOSIS — C3432 Malignant neoplasm of lower lobe, left bronchus or lung: Secondary | ICD-10-CM

## 2014-10-23 DIAGNOSIS — Z79899 Other long term (current) drug therapy: Secondary | ICD-10-CM

## 2014-10-23 LAB — COMPREHENSIVE METABOLIC PANEL
ALT: 18 U/L (ref 17–63)
AST: 18 U/L (ref 15–41)
Albumin: 3 g/dL — ABNORMAL LOW (ref 3.5–5.0)
Alkaline Phosphatase: 44 U/L (ref 38–126)
Anion gap: 6 (ref 5–15)
BUN: 26 mg/dL — ABNORMAL HIGH (ref 6–20)
CALCIUM: 8.1 mg/dL — AB (ref 8.9–10.3)
CO2: 29 mmol/L (ref 22–32)
Chloride: 98 mmol/L — ABNORMAL LOW (ref 101–111)
Creatinine, Ser: 1.55 mg/dL — ABNORMAL HIGH (ref 0.61–1.24)
GFR calc non Af Amer: 40 mL/min — ABNORMAL LOW (ref >60)
GFR, EST AFRICAN AMERICAN: 47 mL/min — AB (ref >60)
Glucose, Bld: 316 mg/dL — ABNORMAL HIGH (ref 65–99)
Potassium: 4.2 mmol/L (ref 3.5–5.1)
SODIUM: 133 mmol/L — AB (ref 135–145)
Total Bilirubin: 0.6 mg/dL (ref 0.3–1.2)
Total Protein: 6.4 g/dL — ABNORMAL LOW (ref 6.5–8.1)

## 2014-10-23 LAB — CBC WITH DIFFERENTIAL/PLATELET
BASOS ABS: 0 10*3/uL (ref 0–0.1)
BASOS PCT: 0 %
Eosinophils Absolute: 0.1 10*3/uL (ref 0–0.7)
Eosinophils Relative: 1 %
HCT: 36.4 % — ABNORMAL LOW (ref 40.0–52.0)
Hemoglobin: 11.3 g/dL — ABNORMAL LOW (ref 13.0–18.0)
Lymphocytes Relative: 2 %
Lymphs Abs: 0.2 10*3/uL — ABNORMAL LOW (ref 1.0–3.6)
MCH: 26.7 pg (ref 26.0–34.0)
MCHC: 31.2 g/dL — ABNORMAL LOW (ref 32.0–36.0)
MCV: 85.8 fL (ref 80.0–100.0)
MONO ABS: 0.7 10*3/uL (ref 0.2–1.0)
Monocytes Relative: 6 %
NEUTROS PCT: 91 %
Neutro Abs: 9.7 10*3/uL — ABNORMAL HIGH (ref 1.4–6.5)
Platelets: 196 10*3/uL (ref 150–440)
RBC: 4.24 MIL/uL — ABNORMAL LOW (ref 4.40–5.90)
RDW: 16 % — ABNORMAL HIGH (ref 11.5–14.5)
WBC: 10.7 10*3/uL — AB (ref 3.8–10.6)

## 2014-10-23 MED ORDER — SODIUM CHLORIDE 0.9 % IJ SOLN
10.0000 mL | Freq: Once | INTRAMUSCULAR | Status: AC
  Administered 2014-10-23: 10 mL via INTRAVENOUS
  Filled 2014-10-23: qty 10

## 2014-10-23 MED ORDER — HEPARIN SOD (PORK) LOCK FLUSH 100 UNIT/ML IV SOLN
500.0000 [IU] | Freq: Once | INTRAVENOUS | Status: AC
  Administered 2014-10-23: 500 [IU] via INTRAVENOUS
  Filled 2014-10-23: qty 5

## 2014-10-23 MED ORDER — LEVOFLOXACIN 500 MG PO TABS: 500.0000 mg | ORAL_TABLET | Freq: Every day | ORAL | Status: DC

## 2014-10-23 NOTE — Progress Notes (Signed)
Patient does have living will.  Former smoker.

## 2014-10-25 ENCOUNTER — Ambulatory Visit
Admission: RE | Admit: 2014-10-25 | Discharge: 2014-10-25 | Disposition: A | Discharge status: Home / Self Care | Payer: Medicare Other | Source: Ambulatory Visit | Attending: Oncology | Admitting: Oncology

## 2014-10-25 ENCOUNTER — Encounter: Payer: Self-pay | Admitting: Oncology

## 2014-10-25 DIAGNOSIS — I714 Abdominal aortic aneurysm, without rupture: Secondary | ICD-10-CM | POA: Diagnosis not present

## 2014-10-25 DIAGNOSIS — C3431 Malignant neoplasm of lower lobe, right bronchus or lung: Secondary | ICD-10-CM | POA: Insufficient documentation

## 2014-10-25 DIAGNOSIS — Z882 Allergy status to sulfonamides status: Secondary | ICD-10-CM | POA: Insufficient documentation

## 2014-10-25 DIAGNOSIS — Z809 Family history of malignant neoplasm, unspecified: Secondary | ICD-10-CM | POA: Insufficient documentation

## 2014-10-25 DIAGNOSIS — C7951 Secondary malignant neoplasm of bone: Secondary | ICD-10-CM | POA: Diagnosis not present

## 2014-10-25 DIAGNOSIS — Z880 Allergy status to penicillin: Secondary | ICD-10-CM | POA: Insufficient documentation

## 2014-10-25 DIAGNOSIS — E780 Pure hypercholesterolemia: Secondary | ICD-10-CM | POA: Insufficient documentation

## 2014-10-25 DIAGNOSIS — G319 Degenerative disease of nervous system, unspecified: Secondary | ICD-10-CM | POA: Diagnosis not present

## 2014-10-25 DIAGNOSIS — N189 Chronic kidney disease, unspecified: Secondary | ICD-10-CM | POA: Diagnosis not present

## 2014-10-25 DIAGNOSIS — R938 Abnormal findings on diagnostic imaging of other specified body structures: Secondary | ICD-10-CM | POA: Diagnosis not present

## 2014-10-25 DIAGNOSIS — I10 Essential (primary) hypertension: Secondary | ICD-10-CM | POA: Insufficient documentation

## 2014-10-25 DIAGNOSIS — Z08 Encounter for follow-up examination after completed treatment for malignant neoplasm: Secondary | ICD-10-CM | POA: Diagnosis present

## 2014-10-25 DIAGNOSIS — E119 Type 2 diabetes mellitus without complications: Secondary | ICD-10-CM | POA: Diagnosis not present

## 2014-10-25 DIAGNOSIS — C44310 Basal cell carcinoma of skin of unspecified parts of face: Secondary | ICD-10-CM | POA: Diagnosis not present

## 2014-10-25 DIAGNOSIS — I2510 Atherosclerotic heart disease of native coronary artery without angina pectoris: Secondary | ICD-10-CM | POA: Insufficient documentation

## 2014-10-25 DIAGNOSIS — R51 Headache: Secondary | ICD-10-CM | POA: Diagnosis present

## 2014-10-25 DIAGNOSIS — J90 Pleural effusion, not elsewhere classified: Secondary | ICD-10-CM | POA: Insufficient documentation

## 2014-10-25 DIAGNOSIS — I639 Cerebral infarction, unspecified: Secondary | ICD-10-CM | POA: Insufficient documentation

## 2014-10-25 LAB — GLUCOSE, CAPILLARY: GLUCOSE-CAPILLARY: 102 mg/dL — AB (ref 65–99)

## 2014-10-25 MED ORDER — FLUDEOXYGLUCOSE F - 18 (FDG) INJECTION
12.3900 | Freq: Once | INTRAVENOUS | Status: AC | PRN
  Administered 2014-10-25: 12.39 via INTRAVENOUS

## 2014-10-25 NOTE — Progress Notes (Signed)
[place] @ [facility] [phone]  Fax:(336) [place]: Nov 03, 1930  MR#: [id number]  ZHY#:[id number]  Patient Care Team: Derinda Late, MD as PCP - General (Family Medicine)  CHIEF COMPLAINT:  Chief Complaint  Patient presents with  . Follow-up    Oncology History   The patient is an 81 year old gentleman with stage IV squamous cell carcinoma of the lung who is seen for assessment prior to Nivolumab.  1. Squamous cell carcinoma of lung. Bronchoscopies positive for right lower lobe mass. Started on radiation therapy and chemotherapy.  August, 2012 Carboplatinum (AUC of 2) and Taxol with concurrent radiation therapy 2. Finished chemo /  radiation  in sept 2012. 3. Bronchoscopy was negative for any  malignant cells (August, 2013) 4. Continuing hemoptysis.  Repeat bronchoscopy is positive for malignant cells consistent with squamous cell carcinoma. 5. Palliative radiation and chemotherapy with carboplatinum and Taxol.(October, 2013) has finished radiation therapy and Taxol, carboplatinum in November of 2013 6.veristrat test is good 7.on Tarceva 8.progressing disease on Tarceva.  (January, 2015) discontinue Tarceva. 9.started on the investigational protocol with  [facility] February, 2015 10Patient is off protocol and continuing NIVO as. commercial product     Lung cancer   12/24/2010 Initial Diagnosis Lung cancer    Oncology Flowsheet 09/08/2013 09/22/2013 10/06/2013 08/28/2014 09/11/2014 09/26/2014 10/09/2014  INV-nivolumab (BMS [id number]) IV 3 mg/kg 3 mg/kg 3 mg/kg - - - -  nivolumab (OPDIVO) IV - - - 3 mg/kg 3 mg/kg 3 mg/kg 3 mg/kg   changes.  Was also reviewed with the patient and compared with the previous  INTERVAL HISTORY: 81 year old gentleman with squamous cell carcinoma of lung recurrent disease on NIVOLULAMAB.  Tolerating treatment very well without any significant nausea vomiting diarrhea.  Cough and shortness of breath is improved.  Here  for further follow-up and treatment consideration had a repeat chest x-ray which has been evaluated. Chest x-rays been independently reviewed and compared with the previous chest x-ray and so stable disease.  No rash.  No diarrhea. October 23, 2014 Patient started having cough and increasing shortness of breath and low-grade fever cough with expectoration.  Feeling weak and tired for last 2 weeks.  No chills.  No hemoptysis. Patient was supposed to continue new or Leyla mapped today Chest pain  REVIEW OF SYSTEMS:   Gen. status: Feeling weak and tired low-grade fever cardiac: No chest pain no palpitation HEENT: No soreness in the marked no difficulty swallowing.  GI: Nausea no vomiting no diarrhea skin: No rash. Lungs: Increasing cough.  Shortness of breath.  Low-grade fever.  Cough with expectoration. Abdomen: No diarrhea. Musculoskeletal system no bony pains. Diabetes is under well control all the blood sugar values have been reviewed.  Lower extremity no swelling.  Patient does have senile tremors. Appetite is poor Neurological system no headache or dizziness no other local symptoms.  All other systems have been reviewed  As per HPI. Otherwise, a complete review of systems is negatve.  PAST MEDICAL HISTORY: Past Medical History  Diagnosis Date  . Hypercholesteremia   . Hypertension   . Diabetes mellitus without complication   . AAA (abdominal aortic aneurysm)   . CAD (coronary artery disease)   . Lung cancer     09/2010  . Skin cancer     PAST SURGICAL HISTORY: Past Surgical History  Procedure Laterality Date  . Abdominal aortic aneurysm repair  2000  . Coronary stent placement  2000  . Tonsillectomy  when he was a child    FAMILY HISTORY Family History  Problem Relation Age of Onset  . Cancer Mother   . Cancer Sister   . Cancer Brother   . Cancer Maternal Aunt     Smoking History quit 10 years ago smoked 2 packs a day(1)  PFSH: Comments: family history of breast  cancer and melanoma.  Comments: patient had smoked for 40 years one pack per day, quit smoking 10 years ago.  Does not drink  Additional Past Medical and Surgical History: coronary artery disease with stent placement.    Non-insulin-dependent diabetes.    Hypertension.    Multiple skin cancers removed including melanoma.    Aneurysm repair.  Hypercholesteremia     ADVANCED DIRECTIVES: Patient does have advanced healthcare directive   HEALTH MAINTENANCE: History  Substance Use Topics  . Smoking status: Former Smoker    Types: Cigarettes, Cigars    Quit date: 04/29/2003  . Smokeless tobacco: Not on file  . Alcohol Use: No       Allergies  Allergen Reactions  . Penicillin G Rash  . Sulfa Antibiotics Rash    Current Outpatient Prescriptions  Medication Sig Dispense Refill  . ADVAIR DISKUS 250-50 MCG/DOSE AEPB INHALE 1 PUFF TWICE A DAY 60 each 0  . benzonatate (TESSALON) 200 MG capsule Take 200 mg by mouth 3 (three) times daily as needed for cough.    . citalopram (CELEXA) 10 MG tablet     . fluticasone (FLONASE) 50 MCG/ACT nasal spray 1 spray. 1 spray in each nostril BID    . furosemide (LASIX) 20 MG tablet     . HUMALOG KWIKPEN 100 UNIT/ML KiwkPen CHECK BLOOD SUGAR BEFORE EACH MEAL. INJECT UNDER THE SKIN PER SLIDING SCALE. MAX 30 U PER DAY  2  . Hydrocodone-Chlorpheniramine 5-4 MG/5ML SOLN Take 5 mLs by mouth 2 (two) times daily. 240 mL 0  . ipratropium-albuterol (DUONEB) 0.5-2.5 (3) MG/3ML SOLN Take 3 mLs by nebulization every 8 (eight) hours as needed.    Marland Kitchen LANTUS SOLOSTAR 100 UNIT/ML Solostar Pen 22 Units. 22 units at bedtime    . lidocaine-prilocaine (EMLA) cream Apply to affected area once 30 g 3  . losartan (COZAAR) 50 MG tablet Take 50 mg by mouth daily.    . metoprolol succinate (TOPROL-XL) 100 MG 24 hr tablet Take 100 mg by mouth daily.    Marland Kitchen omeprazole (PRILOSEC) 40 MG capsule TAKE 1 CAPSULE (40 MG TOTAL) BY MOUTH DAILY. 30 capsule 0  . predniSONE (DELTASONE) 10 MG  tablet 1 TAB(S) ORALLY ONCE A DAY  3  . PROAIR HFA 108 (90 BASE) MCG/ACT inhaler Inhale 2 puffs into the lungs 4 (four) times daily as needed.    Marland Kitchen SPIRIVA HANDIHALER 18 MCG inhalation capsule INHALE 1 CAPSULE WITH HANDIHALER ONCE A DAY  5  . tamsulosin (FLOMAX) 0.4 MG CAPS capsule Take 0.4 mg by mouth daily.  7  . ALPRAZolam (XANAX) 0.25 MG tablet Take 1 tablet (0.25 mg total) by mouth 2 (two) times daily. (Patient not taking: Reported on 10/23/2014) 30 tablet 0  . levofloxacin (LEVAQUIN) 500 MG tablet Take 1 tablet (500 mg total) by mouth daily. 7 tablet 0  . mirtazapine (REMERON) 30 MG tablet Take 1 tablet (30 mg total) by mouth at bedtime. (Patient not taking: Reported on 10/23/2014) 30 tablet 2  . simvastatin (ZOCOR) 20 MG tablet Take 20 mg by mouth daily.     No current facility-administered medications for this visit.  Facility-Administered Medications Ordered in Other Visits  Medication Dose Route Frequency Provider Last Rate Last Dose  . sodium chloride 0.9 % injection 10 mL  10 mL Intracatheter PRN Forest Gleason, MD      . sodium chloride 0.9 % injection 10 mL  10 mL Intracatheter PRN Forest Gleason, MD   10 mL at 09/11/14 1405  . sodium chloride 0.9 % injection 10 mL  10 mL Intracatheter PRN Forest Gleason, MD   10 mL at 09/26/14 1405    OBJECTIVE:  Filed Vitals:   10/23/14 1440  BP: 101/61  Pulse: 105  Temp: 100 F (37.8 C)     Body mass index is 27.27 kg/(m^2).    ECOG FS:1 - Symptomatic but completely ambulatory  PHYSICAL EXAM: Goal status: Performance status is good.  Patient has not lost significant weight HEENT: No evidence of stomatitis.   Sclera and conjunctivae :: No jaundice.   pale looking . Lungs: Crepitation and right base  Dullness on percussion on the right side.  Occasional rhonchi on both sides.  Cardiac: Heart sounds are normal.  No pericardial rub.  No murmur. Lymphatic system: Cervical, axillary, inguinal, lymph nodes not palpable GI: Abdomen is soft.  No  ascites.  Liver spleen not palpable.  No tenderness.  Bowel sounds are within normal limit Lower extremity: No edema Neurological system: Higher functions, cranial nerves intact No evidence of peripheral neuropathy. Skin: No rash.  No ecchymosis.. Musculoskeletal system within normal limit    LAB RESULTS:  Infusion on 10/23/2014  Component Date Value Ref Range Status  . WBC 10/23/2014 10.7* 3.8 - 10.6 K/uL Final  . RBC 10/23/2014 4.24* 4.40 - 5.90 MIL/uL Final  . Hemoglobin 10/23/2014 11.3* 13.0 - 18.0 g/dL Final  . HCT 10/23/2014 36.4* 40.0 - 52.0 % Final  . MCV 10/23/2014 85.8  80.0 - 100.0 fL Final  . MCH 10/23/2014 26.7  26.0 - 34.0 pg Final  . MCHC 10/23/2014 31.2* 32.0 - 36.0 g/dL Final  . RDW 10/23/2014 16.0* 11.5 - 14.5 % Final  . Platelets 10/23/2014 196  150 - 440 K/uL Final  . Neutrophils Relative % 10/23/2014 91   Final  . Neutro Abs 10/23/2014 9.7* 1.4 - 6.5 K/uL Final  . Lymphocytes Relative 10/23/2014 2   Final  . Lymphs Abs 10/23/2014 0.2* 1.0 - 3.6 K/uL Final  . Monocytes Relative 10/23/2014 6   Final  . Monocytes Absolute 10/23/2014 0.7  0.2 - 1.0 K/uL Final  . Eosinophils Relative 10/23/2014 1   Final  . Eosinophils Absolute 10/23/2014 0.1  0 - 0.7 K/uL Final  . Basophils Relative 10/23/2014 0   Final  . Basophils Absolute 10/23/2014 0.0  0 - 0.1 K/uL Final  . Sodium 10/23/2014 133* 135 - 145 mmol/L Final  . Potassium 10/23/2014 4.2  3.5 - 5.1 mmol/L Final  . Chloride 10/23/2014 98* 101 - 111 mmol/L Final  . CO2 10/23/2014 29  22 - 32 mmol/L Final  . Glucose, Bld 10/23/2014 316* 65 - 99 mg/dL Final  . BUN 10/23/2014 26* 6 - 20 mg/dL Final  . Creatinine, Ser 10/23/2014 1.55* 0.61 - 1.24 mg/dL Final  . Calcium 10/23/2014 8.1* 8.9 - 10.3 mg/dL Final  . Total Protein 10/23/2014 6.4* 6.5 - 8.1 g/dL Final  . Albumin 10/23/2014 3.0* 3.5 - 5.0 g/dL Final  . AST 10/23/2014 18  15 - 41 U/L Final  . ALT 10/23/2014 18  17 - 63 U/L Final  . Alkaline Phosphatase  10/23/2014 44  38 - 126 U/L Final  . Total Bilirubin 10/23/2014 0.6  0.3 - 1.2 mg/dL Final  . GFR calc non Af Amer 10/23/2014 40* >60 mL/min Final  . GFR calc Af Amer 10/23/2014 47* >60 mL/min Final   Comment: (NOTE) The eGFR has been calculated using the CKD EPI equation. This calculation has not been validated in all clinical situations. eGFR's persistently <60 mL/min signify possible Chronic Kidney Disease.   . Anion gap 10/23/2014 6  5 - 15 Final      STUDIES: No results found.  ASSESSMENT: Recurrent squamous cell carcinoma of right lower lobe. In last 2 weeks patient's condition has somewhat declined possibility of intercurrent infection versus progressive disease COPD Diabetes insulin-dependent Senile tremors(  MEDICAL DECISION MAKING:  Recent decline in performance status and general condition possibility of acute infection precipitating crisis or possibility of progressing disease.  Last PET scan was done in March of 2016 Proceed with Levaquin 500 milligrams daily for 7 days if there is no improvement a PET scan would be evaluated to rule out any progressive disease Discussed that with the family. Hold chemotherapy  Patient expressed understanding and was in agreement with this plan. He also understands that He can call clinic at any time with any questions, concerns, or complaints.    Lung cancer   Staging form: Lung, AJCC 7th Edition     Clinical: Stage IV (T3, N2, M1b) - Signed by Curt Bears, MD on 05/25/2013     Pathologic: No stage assigned - Marni Griffon, MD   10/25/2014 8:02 AM

## 2014-10-26 ENCOUNTER — Ambulatory Visit
Admission: RE | Admit: 2014-10-26 | Discharge: 2014-10-26 | Disposition: A | Discharge status: Home / Self Care | Payer: Medicare Other | Source: Ambulatory Visit | Attending: Oncology | Admitting: Oncology

## 2014-10-26 DIAGNOSIS — Z08 Encounter for follow-up examination after completed treatment for malignant neoplasm: Secondary | ICD-10-CM | POA: Diagnosis not present

## 2014-10-26 DIAGNOSIS — C3431 Malignant neoplasm of lower lobe, right bronchus or lung: Secondary | ICD-10-CM

## 2014-10-26 MED ORDER — GADOBENATE DIMEGLUMINE 529 MG/ML IV SOLN
20.0000 mL | Freq: Once | INTRAVENOUS | Status: AC | PRN
  Administered 2014-10-26: 17 mL via INTRAVENOUS

## 2014-10-31 ENCOUNTER — Inpatient Hospital Stay: Payer: Medicare Other | Attending: Oncology | Admitting: Oncology

## 2014-10-31 VITALS — BP 92/63 | HR 95 | Temp 97.9°F | Wt 181.2 lb

## 2014-10-31 DIAGNOSIS — Z8673 Personal history of transient ischemic attack (TIA), and cerebral infarction without residual deficits: Secondary | ICD-10-CM

## 2014-10-31 DIAGNOSIS — J449 Chronic obstructive pulmonary disease, unspecified: Secondary | ICD-10-CM | POA: Diagnosis not present

## 2014-10-31 DIAGNOSIS — Z9221 Personal history of antineoplastic chemotherapy: Secondary | ICD-10-CM | POA: Insufficient documentation

## 2014-10-31 DIAGNOSIS — R079 Chest pain, unspecified: Secondary | ICD-10-CM | POA: Insufficient documentation

## 2014-10-31 DIAGNOSIS — R251 Tremor, unspecified: Secondary | ICD-10-CM | POA: Diagnosis not present

## 2014-10-31 DIAGNOSIS — E780 Pure hypercholesterolemia: Secondary | ICD-10-CM | POA: Diagnosis not present

## 2014-10-31 DIAGNOSIS — R05 Cough: Secondary | ICD-10-CM | POA: Diagnosis not present

## 2014-10-31 DIAGNOSIS — E119 Type 2 diabetes mellitus without complications: Secondary | ICD-10-CM | POA: Insufficient documentation

## 2014-10-31 DIAGNOSIS — N281 Cyst of kidney, acquired: Secondary | ICD-10-CM | POA: Diagnosis not present

## 2014-10-31 DIAGNOSIS — C3431 Malignant neoplasm of lower lobe, right bronchus or lung: Secondary | ICD-10-CM | POA: Insufficient documentation

## 2014-10-31 DIAGNOSIS — R630 Anorexia: Secondary | ICD-10-CM | POA: Diagnosis not present

## 2014-10-31 DIAGNOSIS — J90 Pleural effusion, not elsewhere classified: Secondary | ICD-10-CM | POA: Insufficient documentation

## 2014-10-31 DIAGNOSIS — R0602 Shortness of breath: Secondary | ICD-10-CM | POA: Insufficient documentation

## 2014-10-31 DIAGNOSIS — Z809 Family history of malignant neoplasm, unspecified: Secondary | ICD-10-CM | POA: Diagnosis not present

## 2014-10-31 DIAGNOSIS — I2510 Atherosclerotic heart disease of native coronary artery without angina pectoris: Secondary | ICD-10-CM | POA: Diagnosis not present

## 2014-10-31 DIAGNOSIS — Z85828 Personal history of other malignant neoplasm of skin: Secondary | ICD-10-CM

## 2014-10-31 DIAGNOSIS — I714 Abdominal aortic aneurysm, without rupture: Secondary | ICD-10-CM | POA: Diagnosis not present

## 2014-10-31 DIAGNOSIS — Z923 Personal history of irradiation: Secondary | ICD-10-CM | POA: Insufficient documentation

## 2014-10-31 DIAGNOSIS — Z87891 Personal history of nicotine dependence: Secondary | ICD-10-CM | POA: Insufficient documentation

## 2014-10-31 NOTE — Progress Notes (Signed)
Patient does have living will.  Former smoker.

## 2014-11-01 ENCOUNTER — Encounter: Payer: Self-pay | Admitting: Oncology

## 2014-11-01 NOTE — Progress Notes (Signed)
[place] @ [facility] [phone]  Fax:([phone]) [place]: Jul 23, 1930  MR#: [id number]  JYN#:[id number]  Patient Care Team: Derinda Late, MD as PCP - General (Family Medicine)  CHIEF COMPLAINT:  Chief Complaint  Patient presents with  . Follow-up    Oncology History   The patient is an 81 year old gentleman with stage IV squamous cell carcinoma of the lung who is seen for assessment prior to Nivolumab.  1. Squamous cell carcinoma of lung. Bronchoscopies positive for right lower lobe mass. Started on radiation therapy and chemotherapy.  August, 2012 Carboplatinum (AUC of 2) and Taxol with concurrent radiation therapy 2. Finished chemo /  radiation  in sept 2012. 3. Bronchoscopy was negative for any  malignant cells (August, 2013) 4. Continuing hemoptysis.  Repeat bronchoscopy is positive for malignant cells consistent with squamous cell carcinoma. 5. Palliative radiation and chemotherapy with carboplatinum and Taxol.(October, 2013) has finished radiation therapy and Taxol, carboplatinum in November of 2013 6.veristrat test is good 7.on Tarceva 8.progressing disease on Tarceva.  (January, 2015) discontinue Tarceva. 9.started on the investigational protocol with  [facility] February, 2015 10Patient is off protocol and continuing NIVO as. commercial product     Lung cancer   12/24/2010 Initial Diagnosis Lung cancer    Oncology Flowsheet 09/08/2013 09/22/2013 10/06/2013 08/28/2014 09/11/2014 09/26/2014 10/09/2014  INV-nivolumab (BMS [id number]) IV 3 mg/kg 3 mg/kg 3 mg/kg - - - -  nivolumab (OPDIVO) IV - - - 3 mg/kg 3 mg/kg 3 mg/kg 3 mg/kg   changes.  Was also reviewed with the patient and compared with the previous  INTERVAL HISTORY: 81 year old gentleman with squamous cell carcinoma of lung recurrent disease on NIVOLULAMAB.  Tolerating treatment very well without any significant nausea vomiting diarrhea.  Cough and shortness of breath is improved.  Here  for further follow-up and treatment consideration had a repeat chest x-ray which has been evaluated. Chest x-rays been independently reviewed and compared with the previous chest x-ray and so stable disease.  No rash.  No diarrhea. October 23, 2014 Patient started having cough and increasing shortness of breath and low-grade fever cough with expectoration.  Feeling weak and tired for last 2 weeks.  No chills.  No hemoptysis. Patient was supposed to continue new or Leyla mapped today Chest pain October 31, 2014 Patient is here for ongoing evaluation and treatment consideration.  Head MRI scan of brain which revealed old occipital stroke patient was started on Plavix. Chest CT scan also has been done which revealed pleural effusion on the right side and increase metabolic activity of the right lower lobe lung mass Patient continues to feel weak and tired increasing shortness of breath and cough. Poor appetite  REVIEW OF SYSTEMS:   Gen. status: Feeling weak and tired low-grade fever cardiac: No chest pain no palpitation HEENT: No soreness in the marked no difficulty swallowing.  GI: Nausea no vomiting no diarrhea skin: No rash. Lungs: Increasing cough.  Shortness of breath.  Low-grade fever.  Cough with expectoration. Abdomen: No diarrhea. Musculoskeletal system no bony pains. Diabetes is under well control all the blood sugar values have been reviewed.  Lower extremity no swelling.  Patient does have senile tremors. Appetite is poor Neurological system no headache or dizziness no other local symptoms.  All other systems have been reviewed  As per HPI. Otherwise, a complete review of systems is negatve.  PAST MEDICAL HISTORY: Past Medical History  Diagnosis Date  . Hypercholesteremia   . Hypertension   .  Diabetes mellitus without complication   . AAA (abdominal aortic aneurysm)   . CAD (coronary artery disease)   . Lung cancer     09/2010  . Skin cancer     PAST SURGICAL HISTORY: Past  Surgical History  Procedure Laterality Date  . Abdominal aortic aneurysm repair  2000  . Coronary stent placement  2000  . Tonsillectomy      when he was a child    FAMILY HISTORY Family History  Problem Relation Age of Onset  . Cancer Mother   . Cancer Sister   . Cancer Brother   . Cancer Maternal Aunt     Smoking History quit 10 years ago smoked 2 packs a day(1)  PFSH: Comments: family history of breast cancer and melanoma.  Comments: patient had smoked for 40 years one pack per day, quit smoking 10 years ago.  Does not drink  Additional Past Medical and Surgical History: coronary artery disease with stent placement.    Non-insulin-dependent diabetes.    Hypertension.    Multiple skin cancers removed including melanoma.    Aneurysm repair.  Hypercholesteremia     ADVANCED DIRECTIVES: Patient does have advanced healthcare directive   HEALTH MAINTENANCE: History  Substance Use Topics  . Smoking status: Former Smoker    Types: Cigarettes, Cigars    Quit date: 04/29/2003  . Smokeless tobacco: Not on file  . Alcohol Use: No       Allergies  Allergen Reactions  . Penicillin G Rash  . Sulfa Antibiotics Rash    Current Outpatient Prescriptions  Medication Sig Dispense Refill  . ADVAIR DISKUS 250-50 MCG/DOSE AEPB INHALE 1 PUFF TWICE A DAY 60 each 0  . benzonatate (TESSALON) 200 MG capsule Take 200 mg by mouth 3 (three) times daily as needed for cough.    . citalopram (CELEXA) 10 MG tablet     . clopidogrel (PLAVIX) 75 MG tablet Take by mouth.    . fluticasone (FLONASE) 50 MCG/ACT nasal spray 1 spray. 1 spray in each nostril BID    . furosemide (LASIX) 20 MG tablet     . HUMALOG KWIKPEN 100 UNIT/ML KiwkPen CHECK BLOOD SUGAR BEFORE EACH MEAL. INJECT UNDER THE SKIN PER SLIDING SCALE. MAX 30 U PER DAY  2  . Hydrocodone-Chlorpheniramine 5-4 MG/5ML SOLN Take 5 mLs by mouth 2 (two) times daily. 240 mL 0  . ipratropium-albuterol (DUONEB) 0.5-2.5 (3) MG/3ML SOLN Take 3  mLs by nebulization every 8 (eight) hours as needed.    Marland Kitchen LANTUS SOLOSTAR 100 UNIT/ML Solostar Pen 22 Units. 22 units at bedtime    . losartan (COZAAR) 50 MG tablet Take 50 mg by mouth daily.    . metoprolol succinate (TOPROL-XL) 100 MG 24 hr tablet Take 100 mg by mouth daily.    Marland Kitchen omeprazole (PRILOSEC) 40 MG capsule TAKE 1 CAPSULE (40 MG TOTAL) BY MOUTH DAILY. 30 capsule 0  . predniSONE (DELTASONE) 10 MG tablet 1 TAB(S) ORALLY ONCE A DAY  3  . PROAIR HFA 108 (90 BASE) MCG/ACT inhaler Inhale 2 puffs into the lungs 4 (four) times daily as needed.    Marland Kitchen SPIRIVA HANDIHALER 18 MCG inhalation capsule INHALE 1 CAPSULE WITH HANDIHALER ONCE A DAY  5  . tamsulosin (FLOMAX) 0.4 MG CAPS capsule Take 0.4 mg by mouth daily.  7  . ALPRAZolam (XANAX) 0.25 MG tablet Take 1 tablet (0.25 mg total) by mouth 2 (two) times daily. (Patient not taking: Reported on 10/23/2014) 30 tablet 0  . levofloxacin (  LEVAQUIN) 500 MG tablet Take 1 tablet (500 mg total) by mouth daily. (Patient not taking: Reported on 10/31/2014) 7 tablet 0  . lidocaine-prilocaine (EMLA) cream Apply to affected area once (Patient not taking: Reported on 10/31/2014) 30 g 3  . mirtazapine (REMERON) 30 MG tablet Take 1 tablet (30 mg total) by mouth at bedtime. (Patient not taking: Reported on 10/23/2014) 30 tablet 2  . simvastatin (ZOCOR) 20 MG tablet Take 20 mg by mouth daily.     No current facility-administered medications for this visit.   Facility-Administered Medications Ordered in Other Visits  Medication Dose Route Frequency Provider Last Rate Last Dose  . sodium chloride 0.9 % injection 10 mL  10 mL Intracatheter PRN Forest Gleason, MD      . sodium chloride 0.9 % injection 10 mL  10 mL Intracatheter PRN Forest Gleason, MD   10 mL at 09/11/14 1405  . sodium chloride 0.9 % injection 10 mL  10 mL Intracatheter PRN Forest Gleason, MD   10 mL at 09/26/14 1405    OBJECTIVE:  Filed Vitals:   10/31/14 1615  BP: 92/63  Pulse: 95  Temp: 97.9 F (36.6 C)       Body mass index is 26.75 kg/(m^2).    ECOG FS:1 - Symptomatic but completely ambulatory  PHYSICAL EXAM: Goal status: Performance status is good.  Patient has not lost significant weight HEENT: No evidence of stomatitis.   Sclera and conjunctivae :: No jaundice.   pale looking . Lungs: Crepitation and right base  Dullness on percussion on the right side.  Occasional rhonchi on both sides.  Cardiac: Heart sounds are normal.  No pericardial rub.  No murmur. Lymphatic system: Cervical, axillary, inguinal, lymph nodes not palpable GI: Abdomen is soft.  No ascites.  Liver spleen not palpable.  No tenderness.  Bowel sounds are within normal limit Lower extremity: No edema Neurological system: Higher functions, cranial nerves intact No evidence of peripheral neuropathy. Skin: No rash.  No ecchymosis.. Musculoskeletal system within normal limit    LAB RESULTS:  No visits with results within 3 Day(s) from this visit. Latest known visit with results is:  Hospital Outpatient Visit on 10/25/2014  Component Date Value Ref Range Status  . Glucose-Capillary 10/25/2014 102* 65 - 99 mg/dL Final      STUDIES: Mr [name] [name] Contrast  11/20/2014   CLINICAL DATA:  Severe right-sided frontal headaches for 1 month. History of right lower lobe lung cancer.  EXAM: MRI HEAD WITHOUT AND WITH CONTRAST  TECHNIQUE: Multiplanar, multiecho pulse sequences of the brain and surrounding structures were obtained without and with intravenous contrast.  CONTRAST:  42m MULTIHANCE GADOBENATE DIMEGLUMINE 529 MG/ML IV SOLN  COMPARISON:  Head CT 10/08/2013 and MRI 01/23/2012  FINDINGS: There is an approximately 2 cm focus of T2 an diffusion-weighted hyperintensity involving left occipital cortex with normal to minimally restricted diffusion on the ADC map and no abnormal enhancement. There is mild-to-moderate cerebral atrophy, unchanged. Small foci of T2 hyperintensity in the cerebral white matter bilaterally and in the  pons are similar to the prior MRI and nonspecific but compatible with minimal chronic small vessel ischemic disease. No intracranial hemorrhage, mass, midline shift, or extra-axial fluid collection is seen. No enhancing lesions are identified. Chronic lacunar infarcts are again seen in the thalami.  Prior right cataract extraction is noted. There is a chronic, moderate right mastoid effusion. Major intracranial vascular flow voids are preserved.  IMPRESSION: 1. Small, likely early subacute left occipital lobe  cortical infarct. 2. No evidence of intracranial metastases. 3. Unchanged cerebral atrophy and chronic thalamic lacunar infarcts.   Electronically Signed   By: Logan Bores   On: 10/26/2014 14:10   Nm Pet Image Restag (ps) Skull Base To Thigh  10/25/2014   CLINICAL DATA:  Subsequent treatment strategy for right lower lobe lung cancer.  EXAM: NUCLEAR MEDICINE PET SKULL BASE TO THIGH  TECHNIQUE: 12.39 mCi F-18 FDG was injected intravenously. Full-ring PET imaging was performed from the skull base to thigh after the radiotracer. CT data was obtained and used for attenuation correction and anatomic localization.  FASTING BLOOD GLUCOSE:  Value: 102 mg/dl  COMPARISON:  PET-CT dated 07/13/2014  FINDINGS: NECK  No hypermetabolic lymph nodes in the neck.  CHEST  Cavitary central right lower lobe mass (series 3/ image 104), max SUV 15.1, previously 13.6.  Increased interstitial markings in the right upper lobe (series 3/ image 87), nonspecific but possibly reflecting lymphangitic spread of tumor, max SUV 3.3.  Underlying mild to moderate emphysematous changes.  Moderate right pleural effusion, partially loculated.  Cardiomegaly. No pericardial effusion. Coronary atherosclerosis. Atherosclerotic calcifications of the aortic arch. Right chest port terminates in the lower SVC.  Suspected 2.9 x 1.8 cm soft tissue lesion along the pericardium overlying the left atrium (series 3/ image 109), worrisome for metastasis, max  SUV 18.2, previously 12.5.  ABDOMEN/PELVIS  No abnormal hypermetabolic activity within the liver, pancreas, adrenal glands, or spleen.  Atherosclerotic calcifications of the abdominal aorta and branch vessels. 3.5 cm infrarenal abdominal aortic aneurysm. Atherosclerotic plaque with narrowing at the portion of the SMA (series 3/image 132).  Hypermetabolic soft tissue along the left lateral aspect of the descending thoracic aorta just inferior to the aortic hiatus (series 3/image 148), max SUV 8.5, nonspecific/indeterminate. This appearance may reflect retroperitoneal nodal soft tissue (favored) versus a vascular finding.  Bilateral renal cysts.  No hypermetabolic lymph nodes in the abdomen or pelvis.  SKELETON  Focal hypermetabolism involving the L1 vertebral body, max SUV 5.9, new.  IMPRESSION: Suspected mild progression of disease.  Cavitary central right lower lobe mass, corresponding to known primary bronchogenic neoplasm, grossly unchanged. Possible lymphangitic spread in the right upper lobe, equivocal. Associated moderate right pleural effusion.  Suspected pericardial metastasis overlying the left atrium, with increased hypermetabolism.  Hypermetabolic soft tissue along the left lateral aspect of the descending thoracic aorta, new, possibly reflecting retroperitoneal nodal metastasis (favored) versus a vascular finding. Attention on follow-up is suggested.  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   Electronically Signed   By: Julian Hy M.D.   On: 10/25/2014 12:03    ASSESSMENT: Recurrent squamous cell carcinoma of right lower lobe. In last 2 weeks patient's condition has somewhat declined possibility of intercurrent infection versus progressive disease COPD Diabetes insulin-dependent Senile tremors( All the x-rays were reviewed MEDICAL DECISION MAKING:  All lab data and x-rays of the chest has been reviewed. Her right pleural effusion will try to do thoracentesis send fluid  for cytology cells and protein.  Ultrasound-guided thoracentesis has been planned. X-rays have been reviewed with the patient and family That might be progressive disease in the right lower lobe but there is no evidence of metastasis disease MRI scan of brain was within normal limit except for remote stroke patient was started on Plavix by primary care physician We will hold off Plavix because of thoracentesis is planned.  Considering patient has a history of hemoptysis will have to be careful regarding Plavix. Patient will be  reevaluated after thoracentesis result is available. Total duration of visit was 45 minutes.  50% or more time was spent in counseling patient and family regarding prognosis and options of treatment and available resources Patient expressed understanding and was in agreement with this plan. He also understands that He can call clinic at any time with any questions, concerns, or complaints.    Lung cancer   Staging form: Lung, AJCC 7th Edition     Clinical: Stage IV (T3, N2, M1b) - Signed by Curt Bears, MD on 05/25/2013     Pathologic: No stage assigned - Marni Griffon, MD   11/01/2014 8:45 AM

## 2014-11-06 ENCOUNTER — Other Ambulatory Visit: Payer: Self-pay | Admitting: Radiology

## 2014-11-07 ENCOUNTER — Ambulatory Visit
Admission: RE | Admit: 2014-11-07 | Discharge: 2014-11-07 | Disposition: A | Discharge status: Home / Self Care | Payer: Medicare Other | Source: Ambulatory Visit | Attending: Diagnostic Radiology | Admitting: Diagnostic Radiology

## 2014-11-07 ENCOUNTER — Ambulatory Visit
Admission: RE | Admit: 2014-11-07 | Discharge: 2014-11-07 | Disposition: A | Discharge status: Home / Self Care | Payer: Medicare Other | Source: Ambulatory Visit | Attending: Oncology | Admitting: Oncology

## 2014-11-07 DIAGNOSIS — Z9889 Other specified postprocedural states: Secondary | ICD-10-CM | POA: Diagnosis present

## 2014-11-07 DIAGNOSIS — C3431 Malignant neoplasm of lower lobe, right bronchus or lung: Secondary | ICD-10-CM

## 2014-11-07 DIAGNOSIS — J90 Pleural effusion, not elsewhere classified: Secondary | ICD-10-CM | POA: Diagnosis not present

## 2014-11-07 HISTORY — DX: Gastro-esophageal reflux disease without esophagitis: K21.9

## 2014-11-07 HISTORY — DX: Pneumonia, unspecified organism: J18.9

## 2014-11-07 HISTORY — DX: Headache: R51

## 2014-11-07 HISTORY — DX: Reserved for inherently not codable concepts without codable children: IMO0001

## 2014-11-07 HISTORY — DX: Chronic kidney disease, unspecified: N18.9

## 2014-11-07 HISTORY — DX: Chronic obstructive pulmonary disease, unspecified: J44.9

## 2014-11-07 HISTORY — DX: Headache, unspecified: R51.9

## 2014-11-07 LAB — BODY FLUID CELL COUNT WITH DIFFERENTIAL
EOS FL: 0 %
Lymphs, Fluid: 79 %
Monocyte-Macrophage-Serous Fluid: 8 %
NEUTROPHIL FLUID: 13 %
OTHER CELLS FL: 0 %
Total Nucleated Cell Count, Fluid: 398 cu mm

## 2014-11-07 LAB — PROTEIN, BODY FLUID

## 2014-11-07 NOTE — Procedures (Signed)
Right thoracentesis  Complications:  None  Blood Loss: none  See dictation in canopy pacs

## 2014-11-08 LAB — PATHOLOGIST SMEAR REVIEW

## 2014-11-09 ENCOUNTER — Inpatient Hospital Stay (HOSPITAL_BASED_OUTPATIENT_CLINIC_OR_DEPARTMENT_OTHER): Payer: Medicare Other | Admitting: Oncology

## 2014-11-09 ENCOUNTER — Ambulatory Visit: Payer: Medicare Other | Admitting: Oncology

## 2014-11-09 VITALS — BP 102/63 | HR 83 | Temp 97.0°F | Wt 181.0 lb

## 2014-11-09 DIAGNOSIS — Z923 Personal history of irradiation: Secondary | ICD-10-CM

## 2014-11-09 DIAGNOSIS — E119 Type 2 diabetes mellitus without complications: Secondary | ICD-10-CM

## 2014-11-09 DIAGNOSIS — R05 Cough: Secondary | ICD-10-CM

## 2014-11-09 DIAGNOSIS — I714 Abdominal aortic aneurysm, without rupture: Secondary | ICD-10-CM

## 2014-11-09 DIAGNOSIS — R0602 Shortness of breath: Secondary | ICD-10-CM

## 2014-11-09 DIAGNOSIS — R079 Chest pain, unspecified: Secondary | ICD-10-CM

## 2014-11-09 DIAGNOSIS — Z8673 Personal history of transient ischemic attack (TIA), and cerebral infarction without residual deficits: Secondary | ICD-10-CM

## 2014-11-09 DIAGNOSIS — J90 Pleural effusion, not elsewhere classified: Secondary | ICD-10-CM

## 2014-11-09 DIAGNOSIS — E780 Pure hypercholesterolemia: Secondary | ICD-10-CM

## 2014-11-09 DIAGNOSIS — R251 Tremor, unspecified: Secondary | ICD-10-CM

## 2014-11-09 DIAGNOSIS — J449 Chronic obstructive pulmonary disease, unspecified: Secondary | ICD-10-CM

## 2014-11-09 DIAGNOSIS — Z87891 Personal history of nicotine dependence: Secondary | ICD-10-CM

## 2014-11-09 DIAGNOSIS — Z9221 Personal history of antineoplastic chemotherapy: Secondary | ICD-10-CM

## 2014-11-09 DIAGNOSIS — Z809 Family history of malignant neoplasm, unspecified: Secondary | ICD-10-CM

## 2014-11-09 DIAGNOSIS — N281 Cyst of kidney, acquired: Secondary | ICD-10-CM

## 2014-11-09 DIAGNOSIS — C3431 Malignant neoplasm of lower lobe, right bronchus or lung: Secondary | ICD-10-CM | POA: Diagnosis not present

## 2014-11-09 DIAGNOSIS — R630 Anorexia: Secondary | ICD-10-CM

## 2014-11-09 DIAGNOSIS — Z85828 Personal history of other malignant neoplasm of skin: Secondary | ICD-10-CM

## 2014-11-09 DIAGNOSIS — I2510 Atherosclerotic heart disease of native coronary artery without angina pectoris: Secondary | ICD-10-CM

## 2014-11-10 LAB — CYTOLOGY - NON PAP

## 2014-11-16 ENCOUNTER — Inpatient Hospital Stay (HOSPITAL_BASED_OUTPATIENT_CLINIC_OR_DEPARTMENT_OTHER): Payer: Medicare Other | Admitting: Oncology

## 2014-11-16 ENCOUNTER — Inpatient Hospital Stay: Payer: Medicare Other

## 2014-11-16 VITALS — BP 109/61 | HR 83 | Temp 97.6°F | Wt 184.7 lb

## 2014-11-16 DIAGNOSIS — Z87891 Personal history of nicotine dependence: Secondary | ICD-10-CM

## 2014-11-16 DIAGNOSIS — I2510 Atherosclerotic heart disease of native coronary artery without angina pectoris: Secondary | ICD-10-CM

## 2014-11-16 DIAGNOSIS — J449 Chronic obstructive pulmonary disease, unspecified: Secondary | ICD-10-CM

## 2014-11-16 DIAGNOSIS — C3490 Malignant neoplasm of unspecified part of unspecified bronchus or lung: Secondary | ICD-10-CM

## 2014-11-16 DIAGNOSIS — C3431 Malignant neoplasm of lower lobe, right bronchus or lung: Secondary | ICD-10-CM | POA: Diagnosis not present

## 2014-11-16 DIAGNOSIS — R0602 Shortness of breath: Secondary | ICD-10-CM

## 2014-11-16 DIAGNOSIS — I714 Abdominal aortic aneurysm, without rupture: Secondary | ICD-10-CM

## 2014-11-16 DIAGNOSIS — R079 Chest pain, unspecified: Secondary | ICD-10-CM | POA: Diagnosis not present

## 2014-11-16 DIAGNOSIS — R630 Anorexia: Secondary | ICD-10-CM

## 2014-11-16 DIAGNOSIS — Z85828 Personal history of other malignant neoplasm of skin: Secondary | ICD-10-CM

## 2014-11-16 DIAGNOSIS — E119 Type 2 diabetes mellitus without complications: Secondary | ICD-10-CM

## 2014-11-16 DIAGNOSIS — Z8673 Personal history of transient ischemic attack (TIA), and cerebral infarction without residual deficits: Secondary | ICD-10-CM

## 2014-11-16 DIAGNOSIS — R05 Cough: Secondary | ICD-10-CM | POA: Diagnosis not present

## 2014-11-16 DIAGNOSIS — Z809 Family history of malignant neoplasm, unspecified: Secondary | ICD-10-CM

## 2014-11-16 DIAGNOSIS — R251 Tremor, unspecified: Secondary | ICD-10-CM

## 2014-11-16 DIAGNOSIS — N281 Cyst of kidney, acquired: Secondary | ICD-10-CM

## 2014-11-16 DIAGNOSIS — Z9221 Personal history of antineoplastic chemotherapy: Secondary | ICD-10-CM

## 2014-11-16 DIAGNOSIS — Z923 Personal history of irradiation: Secondary | ICD-10-CM

## 2014-11-16 DIAGNOSIS — J90 Pleural effusion, not elsewhere classified: Secondary | ICD-10-CM

## 2014-11-16 DIAGNOSIS — E780 Pure hypercholesterolemia: Secondary | ICD-10-CM

## 2014-11-16 MED ORDER — HYDROCODONE-CHLORPHENIRAMINE 5-4 MG/5ML PO SOLN: 5.0000 mL | Freq: Two times a day (BID) | ORAL | Status: DC

## 2014-11-16 MED ORDER — SODIUM CHLORIDE 0.9 % IV SOLN
Freq: Once | INTRAVENOUS | Status: AC
  Administered 2014-11-16: 14:00:00 via INTRAVENOUS
  Filled 2014-11-16: qty 1000

## 2014-11-16 MED ORDER — NIVOLUMAB CHEMO INJECTION 100 MG/10ML
3.0000 mg/kg | Freq: Once | INTRAVENOUS | Status: AC
  Administered 2014-11-16: 260 mg via INTRAVENOUS
  Filled 2014-11-16: qty 6

## 2014-11-16 MED ORDER — HEPARIN SOD (PORK) LOCK FLUSH 100 UNIT/ML IV SOLN
500.0000 [IU] | Freq: Once | INTRAVENOUS | Status: AC
  Administered 2014-11-16: 500 [IU] via INTRAVENOUS

## 2014-11-16 MED ORDER — SODIUM CHLORIDE 0.9 % IJ SOLN
10.0000 mL | INTRAMUSCULAR | Status: DC | PRN
  Administered 2014-11-16: 10 mL via INTRAVENOUS
  Filled 2014-11-16: qty 10

## 2014-11-16 MED ORDER — GUAIFENESIN ER 600 MG PO TB12: 600.0000 mg | ORAL_TABLET | Freq: Two times a day (BID) | ORAL | Status: DC

## 2014-11-16 NOTE — Progress Notes (Signed)
Patient does have living will. Former smoker.

## 2014-11-17 ENCOUNTER — Other Ambulatory Visit: Payer: Self-pay | Admitting: Family Medicine

## 2014-11-17 ENCOUNTER — Encounter: Payer: Self-pay | Admitting: Oncology

## 2014-11-17 ENCOUNTER — Telehealth: Payer: Self-pay | Admitting: *Deleted

## 2014-11-17 NOTE — Progress Notes (Signed)
[name] [name] @ [facility] [phone]  Fax:([phone]) [place]: April 16, 1931  MR#: [id number]  EXB#:[id number]  Patient Care Team: Derinda Late, MD as PCP - General (Family Medicine)  CHIEF COMPLAINT:  Chief Complaint  Patient presents with  . Follow-up    Oncology History   The patient is an 81 year old gentleman with stage IV squamous cell carcinoma of the lung who is seen for assessment prior to Nivolumab.  1. Squamous cell carcinoma of lung. Bronchoscopies positive for right lower lobe mass. Started on radiation therapy and chemotherapy.  August, 2012 Carboplatinum (AUC of 2) and Taxol with concurrent radiation therapy 2. Finished chemo /  radiation  in sept 2012. 3. Bronchoscopy was negative for any  malignant cells (August, 2013) 4. Continuing hemoptysis.  Repeat bronchoscopy is positive for malignant cells consistent with squamous cell carcinoma. 5. Palliative radiation and chemotherapy with carboplatinum and Taxol.(October, 2013) has finished radiation therapy and Taxol, carboplatinum in November of 2013 6.veristrat test is good 7.on Tarceva 8.progressing disease on Tarceva.  (January, 2015) discontinue Tarceva. 9.started on the investigational protocol with  [facility] February, 2015 10Patient is off protocol and continuing NIVO as. commercial product 11.  Complete evaluation during July 2 016 including thoracentesis cytology was negative for malignant cells     Lung cancer   12/24/2010 Initial Diagnosis Lung cancer    Oncology Flowsheet 09/22/2013 10/06/2013 08/28/2014 09/11/2014 09/26/2014 10/09/2014 11/16/2014  INV-nivolumab (BMS [id number]) IV 3 mg/kg 3 mg/kg - - - - -  nivolumab (OPDIVO) IV - - 3 mg/kg 3 mg/kg 3 mg/kg 3 mg/kg 3 mg/kg   changes.  Was also reviewed with the patient and compared with the previous  INTERVAL HISTORY: 81 year old gentleman with squamous cell carcinoma of lung recurrent disease on NIVOLULAMAB.  Tolerating treatment  very well without any significant nausea vomiting diarrhea.  Cough and shortness of breath is improved.  Here for further follow-up and treatment consideration had a repeat chest x-ray which has been evaluated. Chest x-rays been independently reviewed and compared with the previous chest x-ray and so stable disease.  No rash.  No diarrhea. October 23, 2014 Patient started having cough and increasing shortness of breath and low-grade fever cough with expectoration.  Feeling weak and tired for last 2 weeks.  No chills.  No hemoptysis. Patient was supposed to continue new or Leyla mapped today Chest pain October 31, 2014 Patient is here for ongoing evaluation and treatment consideration.  Head MRI scan of brain which revealed old occipital stroke patient was started on Plavix. Chest CT scan also has been done which revealed pleural effusion on the right side and increase metabolic activity of the right lower lobe lung mass Patient continues to feel weak and tired increasing shortness of breath and cough. Poor appetite November 16, 2014 Patient is here for further follow-up thoracentesis reveals negative cytology. We will continue patient on you on a low man  REVIEW OF SYSTEMS:   Gen. status: Feeling weak and tired low-grade fever cardiac: No chest pain no palpitation HEENT: No soreness in the marked no difficulty swallowing.  GI: Nausea no vomiting no diarrhea skin: No rash. Lungs: Increasing cough.  Shortness of breath.  Low-grade fever.  Cough with expectoration. Abdomen: No diarrhea. Musculoskeletal system no bony pains. Diabetes is under well control all the blood sugar values have been reviewed.  Lower extremity no swelling.  Patient does have senile tremors. Appetite is poor Neurological system no headache or dizziness no other  local symptoms.  All other systems have been reviewed  As per HPI. Otherwise, a complete review of systems is negatve.  PAST MEDICAL HISTORY: Past Medical History    Diagnosis Date  . Hypercholesteremia   . Hypertension   . Diabetes mellitus without complication   . AAA (abdominal aortic aneurysm)   . Lung cancer     09/2010  . Skin cancer   . CAD (coronary artery disease) stent placed  . COPD (chronic obstructive pulmonary disease)   . Shortness of breath dyspnea   . Pneumonia   . Chronic kidney disease   . GERD (gastroesophageal reflux disease)   . Headache     PAST SURGICAL HISTORY: Past Surgical History  Procedure Laterality Date  . Abdominal aortic aneurysm repair  2000  . Coronary stent placement  2000  . Tonsillectomy      when he was a child    FAMILY HISTORY Family History  Problem Relation Age of Onset  . Cancer Mother   . Cancer Sister   . Cancer Brother   . Cancer Maternal Aunt     Smoking History quit 10 years ago smoked 2 packs a day(1)  PFSH: Comments: family history of breast cancer and melanoma.  Comments: patient had smoked for 40 years one pack per day, quit smoking 10 years ago.  Does not drink  Additional Past Medical and Surgical History: coronary artery disease with stent placement.    Non-insulin-dependent diabetes.    Hypertension.    Multiple skin cancers removed including melanoma.    Aneurysm repair.  Hypercholesteremia     ADVANCED DIRECTIVES: Patient does have advanced healthcare directive   HEALTH MAINTENANCE: History  Substance Use Topics  . Smoking status: Former Smoker    Types: Cigarettes, Cigars    Quit date: 04/29/2003  . Smokeless tobacco: Not on file  . Alcohol Use: No       Allergies  Allergen Reactions  . Penicillin G Rash  . Sulfa Antibiotics Rash    Current Outpatient Prescriptions  Medication Sig Dispense Refill  . ADVAIR DISKUS 250-50 MCG/DOSE AEPB INHALE 1 PUFF TWICE A DAY 60 each 0  . benzonatate (TESSALON) 200 MG capsule Take 200 mg by mouth 3 (three) times daily as needed for cough.    . citalopram (CELEXA) 10 MG tablet     . fluticasone (FLONASE) 50  MCG/ACT nasal spray 1 spray. 1 spray in each nostril BID    . furosemide (LASIX) 20 MG tablet     . HUMALOG KWIKPEN 100 UNIT/ML KiwkPen CHECK BLOOD SUGAR BEFORE EACH MEAL. INJECT UNDER THE SKIN PER SLIDING SCALE. MAX 30 U PER DAY  2  . Hydrocodone-Chlorpheniramine 5-4 MG/5ML SOLN Take 5 mLs by mouth 2 (two) times daily. 240 mL 0  . ipratropium-albuterol (DUONEB) 0.5-2.5 (3) MG/3ML SOLN Take 3 mLs by nebulization every 8 (eight) hours as needed.    Marland Kitchen LANTUS SOLOSTAR 100 UNIT/ML Solostar Pen 22 Units. 22 units at bedtime    . lidocaine-prilocaine (EMLA) cream Apply to affected area once 30 g 3  . metoprolol succinate (TOPROL-XL) 100 MG 24 hr tablet Take 100 mg by mouth daily.    Marland Kitchen omeprazole (PRILOSEC) 40 MG capsule TAKE 1 CAPSULE (40 MG TOTAL) BY MOUTH DAILY. 30 capsule 0  . predniSONE (DELTASONE) 10 MG tablet 1 TAB(S) ORALLY ONCE A DAY  3  . PROAIR HFA 108 (90 BASE) MCG/ACT inhaler Inhale 2 puffs into the lungs 4 (four) times daily as needed.    Marland Kitchen  SPIRIVA HANDIHALER 18 MCG inhalation capsule INHALE 1 CAPSULE WITH HANDIHALER ONCE A DAY  5  . tamsulosin (FLOMAX) 0.4 MG CAPS capsule Take 0.4 mg by mouth daily.  7  . clopidogrel (PLAVIX) 75 MG tablet Take by mouth.    Marland Kitchen guaiFENesin (MUCINEX) 600 MG 12 hr tablet Take 1 tablet (600 mg total) by mouth 2 (two) times daily. 30 tablet 0  . losartan (COZAAR) 50 MG tablet TAKE 1 TABLET BY MOUTH ONCE A DAY AS DIRECTED  5   No current facility-administered medications for this visit.   Facility-Administered Medications Ordered in Other Visits  Medication Dose Route Frequency Provider Last Rate Last Dose  . sodium chloride 0.9 % injection 10 mL  10 mL Intracatheter PRN Forest Gleason, MD      . sodium chloride 0.9 % injection 10 mL  10 mL Intracatheter PRN Forest Gleason, MD   10 mL at 09/11/14 1405  . sodium chloride 0.9 % injection 10 mL  10 mL Intracatheter PRN Forest Gleason, MD   10 mL at 09/26/14 1405    OBJECTIVE:  Filed Vitals:   11/16/14 1347  BP:  109/61  Pulse: 83  Temp: 97.6 F (36.4 C)     Body mass index is 25.78 kg/(m^2).    ECOG FS:1 - Symptomatic but completely ambulatory  PHYSICAL EXAM: Goal status: Performance status is good.  Patient has not lost significant weight HEENT: No evidence of stomatitis.   Sclera and conjunctivae :: No jaundice.   pale looking . Lungs: Crepitation and right base  Dullness on percussion on the right side.  Occasional rhonchi on both sides.  Cardiac: Heart sounds are normal.  No pericardial rub.  No murmur. Lymphatic system: Cervical, axillary, inguinal, lymph nodes not palpable GI: Abdomen is soft.  No ascites.  Liver spleen not palpable.  No tenderness.  Bowel sounds are within normal limit Lower extremity: No edema Neurological system: Higher functions, cranial nerves intact No evidence of peripheral neuropathy. Skin: No rash.  No ecchymosis.. Musculoskeletal system within normal limit    LAB RESULTS:  No visits with results within 3 Day(s) from this visit. Latest known visit with results is:  Hospital Outpatient Visit on 11/07/2014  Component Date Value Ref Range Status  . CYTOLOGY - NON GYN 11/07/2014    Final                   Value:Cytology - Non PAP CASE: [id number] PATIENT: [name] [name] Non-Gyn Cytology Report     SPECIMEN SUBMITTED: A. Pleural fluid, right  CLINICAL HISTORY: None provided  PRE-OPERATIVE DIAGNOSIS: Right pleural effusion  POST-OPERATIVE DIAGNOSIS: None Provided     DIAGNOSIS: A. PLEURAL FLUID, RIGHT; ULTRASOUND-GUIDED THORACENTESIS: - NEGATIVE FOR MALIGNANCY. - LYMPHOCYTES, A FEW NEUTROPHILS, AND RARE MACROPHAGES ARE PRESENT.  Comment: Slides reviewed: One cytospin slide, one ThinPrep slide, and slides from one cell block.   GROSS DESCRIPTION: A.  Specimen Labeled: right pleural fluid Volume: 270 mL Description: yellow to brown fluid Cell block(s): 1 and 1 ThinPrep Final Diagnosis performed by Bryan Lemma, MD.  Electronically  signed 11/10/2014 6:00:01PM    The electronic signature indicates that the named Attending Pathologist has evaluated the specimen  Technical component performed at [facility], [address], [place], [place] [address] Lab: Miranda: Darrick Penna. Evette Doffing, MD  Professional component performed at [facility], [place], [place],  [address] Lab: [phone] Dir: Dellia Nims. Rubinas, MD    . Total protein, fluid 11/07/2014 <3.0   Final   Comment: (NOTE) No normal range established for this test Results should be evaluated in conjunction with serum values   . Fluid Type-FTP 11/07/2014 CYTO PLEU   Final  . Fluid Type-FCT 11/07/2014 CYTO PLEU   Final  . Color, Fluid 11/07/2014 PINK* YELLOW Final  . Appearance, Fluid 11/07/2014 CLOUDY* CLEAR Final  . WBC, Fluid 11/07/2014 398   Final  . Neutrophil Count, Fluid 11/07/2014 13   Final  . Lymphs, Fluid 11/07/2014 79   Final  . Monocyte-Macrophage-Serous Fluid 11/07/2014 8   Final  . Eos, Fluid 11/07/2014 0   Final  . Other Cells, Fluid 11/07/2014 0   Final  . Path Review 11/07/2014 Please see cytology report.   Final   Reviewed by Lemmie Evens. Dicie Beam, MD.      STUDIES: Dg Chest 1 View  11/07/2014   CLINICAL DATA:  Status post right thoracentesis  EXAM: CHEST  1 VIEW  COMPARISON:  09/11/2014  FINDINGS: Cardiac shadow is stable. Stable consolidation in the right perihilar region is noted. A right chest wall port is again seen. There is been interval thoracentesis with creation of a air-fluid level within the right chest cavity. The degree of fluid is less than that seen on the prior exam and during the procedure appeared less than that noted on prior PET-CT. The air-fluid level is related to the recent thoracentesis but does not represent a true pneumothorax. It is related to incomplete re-expansion of the right lung. This area will likely refill with fluid and continue to have a  multiloculated appearance. The left lung remains clear. No bony abnormality is seen.  IMPRESSION: Status post right thoracentesis with some reduction in pleural fluid. Incomplete re-expansion of the right lung due to the underlying neoplasm is noted. No true pneumothorax is noted.   Electronically Signed   By: Inez Catalina M.D.   On: 11/07/2014 15:27   Mr Jeri Cos CZ Contrast  10/26/2014   CLINICAL DATA:  Severe right-sided frontal headaches for 1 month. History of right lower lobe lung cancer.  EXAM: MRI HEAD WITHOUT AND WITH CONTRAST  TECHNIQUE: Multiplanar, multiecho pulse sequences of the brain and surrounding structures were obtained without and with intravenous contrast.  CONTRAST:  20m MULTIHANCE GADOBENATE DIMEGLUMINE 529 MG/ML IV SOLN  COMPARISON:  Head CT 10/08/2013 and MRI 01/23/2012  FINDINGS: There is an approximately 2 cm focus of T2 an diffusion-weighted hyperintensity involving left occipital cortex with normal to minimally restricted diffusion on the ADC map and no abnormal enhancement. There is mild-to-moderate cerebral atrophy, unchanged. Small foci of T2 hyperintensity in the cerebral white matter bilaterally and in the pons are similar to the prior MRI and nonspecific but compatible with minimal chronic small vessel ischemic disease. No intracranial hemorrhage, mass, midline shift, or extra-axial fluid collection is seen. No enhancing lesions are identified. Chronic lacunar infarcts are again seen in the thalami.  Prior right cataract extraction is noted. There is a chronic, moderate right mastoid effusion. Major intracranial vascular flow voids are preserved.  IMPRESSION: 1. Small, likely early subacute left occipital lobe cortical infarct. 2. No evidence of intracranial metastases. 3. Unchanged cerebral atrophy and chronic thalamic lacunar infarcts.   Electronically Signed   By: ALogan Bores  On: 10/26/2014 14:10   Nm Pet Image Restag (ps) Skull Base To Thigh  10/25/2014   CLINICAL  DATA:  Subsequent treatment strategy for right lower lobe lung cancer.  EXAM: NUCLEAR MEDICINE PET SKULL BASE TO THIGH  TECHNIQUE: 12.39 mCi F-18 FDG was injected intravenously. Full-ring PET imaging was performed from the skull base to thigh after the radiotracer. CT data was obtained and used for attenuation correction and anatomic localization.  FASTING BLOOD GLUCOSE:  Value: 102 mg/dl  COMPARISON:  PET-CT dated 07/13/2014  FINDINGS: NECK  No hypermetabolic lymph nodes in the neck.  CHEST  Cavitary central right lower lobe mass (series 3/ image 104), max SUV 15.1, previously 13.6.  Increased interstitial markings in the right upper lobe (series 3/ image 87), nonspecific but possibly reflecting lymphangitic spread of tumor, max SUV 3.3.  Underlying mild to moderate emphysematous changes.  Moderate right pleural effusion, partially loculated.  Cardiomegaly. No pericardial effusion. Coronary atherosclerosis. Atherosclerotic calcifications of the aortic arch. Right chest port terminates in the lower SVC.  Suspected 2.9 x 1.8 cm soft tissue lesion along the pericardium overlying the left atrium (series 3/ image 109), worrisome for metastasis, max SUV 18.2, previously 12.5.  ABDOMEN/PELVIS  No abnormal hypermetabolic activity within the liver, pancreas, adrenal glands, or spleen.  Atherosclerotic calcifications of the abdominal aorta and branch vessels. 3.5 cm infrarenal abdominal aortic aneurysm. Atherosclerotic plaque with narrowing at the portion of the SMA (series 3/image 132).  Hypermetabolic soft tissue along the left lateral aspect of the descending thoracic aorta just inferior to the aortic hiatus (series 3/image 148), max SUV 8.5, nonspecific/indeterminate. This appearance may reflect retroperitoneal nodal soft tissue (favored) versus a vascular finding.  Bilateral renal cysts.  No hypermetabolic lymph nodes in the abdomen or pelvis.  SKELETON  Focal hypermetabolism involving the L1 vertebral body, max SUV  5.9, new.  IMPRESSION: Suspected mild progression of disease.  Cavitary central right lower lobe mass, corresponding to known primary bronchogenic neoplasm, grossly unchanged. Possible lymphangitic spread in the right upper lobe, equivocal. Associated moderate right pleural effusion.  Suspected pericardial metastasis overlying the left atrium, with increased hypermetabolism.  Hypermetabolic soft tissue along the left lateral aspect of the descending thoracic aorta, new, possibly reflecting retroperitoneal nodal metastasis (favored) versus a vascular finding. Attention on follow-up is suggested.  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   Electronically Signed   By: Julian Hy M.D.   On: 10/25/2014 12:03   US Thoracentesis Asp Pleural Space W/img Guide  11/07/2014   CLINICAL DATA:  Right-sided pleural effusion  EXAM: ULTRASOUND GUIDED right THORACENTESIS  PROCEDURE: An ultrasound guided thoracentesis was thoroughly discussed with the patient and questions answered. The benefits, risks, alternatives and complications were also discussed. The patient understands and wishes to proceed with the procedure. Written consent was obtained.  Ultrasound was performed to localize and mark an adequate pocket of fluid in the right chest. The area was then prepped and draped in the normal sterile fashion. 1% Lidocaine was used for local anesthesia. Under ultrasound guidance a 6 French thoracentesis catheter was introduced. Thoracentesis was performed. The catheter was removed and a dressing applied.  COMPLICATIONS: None  FINDINGS: A total of approximately 300 mL of somewhat bloody fluid was removed. A fluid sample was not sent for laboratory analysis.  IMPRESSION: Successful ultrasound guided right thoracentesis yielding 300 mL of pleural fluid.   Electronically Signed   By: Inez Catalina M.D.   On: 11/07/2014 15:22    ASSESSMENT: Recurrent squamous cell carcinoma of right lower lobe. In last 2 weeks  patient's condition has  somewhat declined possibility of intercurrent infection versus progressive disease COPD Diabetes insulin-dependent Senile tremors( All the x-rays were reviewed MEDICAL DECISION MAKING:  Thoracentesis was negative for malignant cells.  There is no clear-cut evidence of progressive tumor Will continue patient on NIVOLULAMAB   Lung cancer   Staging form: Lung, AJCC 7th Edition     Clinical: Stage IV (T3, N2, M1b) - Signed by Curt Bears, MD on 05/25/2013     Pathologic: No stage assigned - Marni Griffon, MD   11/17/2014 3:12 PM

## 2014-11-17 NOTE — Telephone Encounter (Signed)
Please call to clarify Tussionex prescription.  Spoke with Angie Fava, RN regarding prescription.  States it should be 10/5.   Clarified with pharmacist.  Called patient to let them know it had been corrected.

## 2014-11-19 ENCOUNTER — Encounter: Payer: Self-pay | Admitting: Oncology

## 2014-11-19 NOTE — Progress Notes (Signed)
[place] @ [facility] [phone]  Fax:([phone]) [place]: Jan 10, 1931  MR#: [id number]  PYK#:[id number]  Patient Care Team: Derinda Late, MD as PCP - General (Family Medicine)  CHIEF COMPLAINT:  Chief Complaint  Patient presents with  . Follow-up    Oncology History   The patient is an 81 year old gentleman with stage IV squamous cell carcinoma of the lung who is seen for assessment prior to Nivolumab.  1. Squamous cell carcinoma of lung. Bronchoscopies positive for right lower lobe mass. Started on radiation therapy and chemotherapy.  August, 2012 Carboplatinum (AUC of 2) and Taxol with concurrent radiation therapy 2. Finished chemo /  radiation  in sept 2012. 3. Bronchoscopy was negative for any  malignant cells (August, 2013) 4. Continuing hemoptysis.  Repeat bronchoscopy is positive for malignant cells consistent with squamous cell carcinoma. 5. Palliative radiation and chemotherapy with carboplatinum and Taxol.(October, 2013) has finished radiation therapy and Taxol, carboplatinum in November of 2013 6.veristrat test is good 7.on Tarceva 8.progressing disease on Tarceva.  (January, 2015) discontinue Tarceva. 9.started on the investigational protocol with  [facility] February, 2015 10Patient is off protocol and continuing NIVO as. commercial product 11.  Complete evaluation during July 2 016 including thoracentesis cytology was negative for malignant cells     Lung cancer   12/24/2010 Initial Diagnosis Lung cancer    Oncology Flowsheet 09/22/2013 10/06/2013 08/28/2014 09/11/2014 09/26/2014 10/09/2014 11/16/2014  INV-nivolumab (BMS [id number]) IV 3 mg/kg 3 mg/kg - - - - -  nivolumab (OPDIVO) IV - - 3 mg/kg 3 mg/kg 3 mg/kg 3 mg/kg 3 mg/kg   changes.  Was also reviewed with the patient and compared with the previous  INTERVAL HISTORY: 81 year old gentleman with squamous cell carcinoma of lung recurrent disease on NIVOLULAMAB.  Tolerating treatment  very well without any significant nausea vomiting diarrhea.  Cough and shortness of breath is improved.  Here for further follow-up and treatment consideration had a repeat chest x-ray which has been evaluated. Chest x-rays been independently reviewed and compared with the previous chest x-ray and so stable disease.  No rash.  No diarrhea. October 23, 2014 Patient started having cough and increasing shortness of breath and low-grade fever cough with expectoration.  Feeling weak and tired for last 2 weeks.  No chills.  No hemoptysis. Patient was supposed to continue new or Leyla mapped today Chest pain October 31, 2014 Patient is here for ongoing evaluation and treatment consideration.  Head MRI scan of brain which revealed old occipital stroke patient was started on Plavix. Chest CT scan also has been done which revealed pleural effusion on the right side and increase metabolic activity of the right lower lobe lung mass Patient continues to feel weak and tired increasing shortness of breath and cough. Poor appetite\November 09, 2014 Patient had a thoracentesis done.  Fluid shows lymphocytes.  Cytology is pending.  Shortness of breath improved.  REVIEW OF SYSTEMS:   Gen. status: Feeling weak and tired low-grade fever cardiac: No chest pain no palpitation HEENT: No soreness in the marked no difficulty swallowing.  GI: Nausea no vomiting no diarrhea skin: No rash. Lungs: Increasing cough.  Shortness of breath.  Low-grade fever.  Cough with expectoration. Abdomen: No diarrhea. Musculoskeletal system no bony pains. Diabetes is under well control all the blood sugar values have been reviewed.  Lower extremity no swelling.  Patient does have senile tremors. Appetite is poor Neurological system no headache or dizziness no other local symptoms.  All other systems have been reviewed  As per HPI. Otherwise, a complete review of systems is negatve.  PAST MEDICAL HISTORY: Past Medical History  Diagnosis Date  .  Hypercholesteremia   . Hypertension   . Diabetes mellitus without complication   . AAA (abdominal aortic aneurysm)   . Lung cancer     09/2010  . Skin cancer   . CAD (coronary artery disease) stent placed  . COPD (chronic obstructive pulmonary disease)   . Shortness of breath dyspnea   . Pneumonia   . Chronic kidney disease   . GERD (gastroesophageal reflux disease)   . Headache     PAST SURGICAL HISTORY: Past Surgical History  Procedure Laterality Date  . Abdominal aortic aneurysm repair  2000  . Coronary stent placement  2000  . Tonsillectomy      when he was a child    FAMILY HISTORY Family History  Problem Relation Age of Onset  . Cancer Mother   . Cancer Sister   . Cancer Brother   . Cancer Maternal Aunt     Smoking History quit 10 years ago smoked 2 packs a day(1)  PFSH: Comments: family history of breast cancer and melanoma.  Comments: patient had smoked for 40 years one pack per day, quit smoking 10 years ago.  Does not drink  Additional Past Medical and Surgical History: coronary artery disease with stent placement.    Non-insulin-dependent diabetes.    Hypertension.    Multiple skin cancers removed including melanoma.    Aneurysm repair.  Hypercholesteremia     ADVANCED DIRECTIVES: Patient does have advanced healthcare directive   HEALTH MAINTENANCE: History  Substance Use Topics  . Smoking status: Former Smoker    Types: Cigarettes, Cigars    Quit date: 04/29/2003  . Smokeless tobacco: Not on file  . Alcohol Use: No       Allergies  Allergen Reactions  . Penicillin G Rash  . Sulfa Antibiotics Rash    Current Outpatient Prescriptions  Medication Sig Dispense Refill  . ADVAIR DISKUS 250-50 MCG/DOSE AEPB INHALE 1 PUFF TWICE A DAY 60 each 0  . benzonatate (TESSALON) 200 MG capsule Take 200 mg by mouth 3 (three) times daily as needed for cough.    . citalopram (CELEXA) 10 MG tablet     . clopidogrel (PLAVIX) 75 MG tablet Take by mouth.      . fluticasone (FLONASE) 50 MCG/ACT nasal spray 1 spray. 1 spray in each nostril BID    . furosemide (LASIX) 20 MG tablet     . HUMALOG KWIKPEN 100 UNIT/ML KiwkPen CHECK BLOOD SUGAR BEFORE EACH MEAL. INJECT UNDER THE SKIN PER SLIDING SCALE. MAX 30 U PER DAY  2  . ipratropium-albuterol (DUONEB) 0.5-2.5 (3) MG/3ML SOLN Take 3 mLs by nebulization every 8 (eight) hours as needed.    Marland Kitchen LANTUS SOLOSTAR 100 UNIT/ML Solostar Pen 22 Units. 22 units at bedtime    . lidocaine-prilocaine (EMLA) cream Apply to affected area once 30 g 3  . losartan (COZAAR) 50 MG tablet TAKE 1 TABLET BY MOUTH ONCE A DAY AS DIRECTED  5  . metoprolol succinate (TOPROL-XL) 100 MG 24 hr tablet Take 100 mg by mouth daily.    Marland Kitchen omeprazole (PRILOSEC) 40 MG capsule TAKE 1 CAPSULE (40 MG TOTAL) BY MOUTH DAILY. 30 capsule 0  . predniSONE (DELTASONE) 10 MG tablet 1 TAB(S) ORALLY ONCE A DAY  3  . PROAIR HFA 108 (90 BASE) MCG/ACT inhaler Inhale 2 puffs into  the lungs 4 (four) times daily as needed.    Marland Kitchen SPIRIVA HANDIHALER 18 MCG inhalation capsule INHALE 1 CAPSULE WITH HANDIHALER ONCE A DAY  5  . tamsulosin (FLOMAX) 0.4 MG CAPS capsule Take 0.4 mg by mouth daily.  7  . guaiFENesin (MUCINEX) 600 MG 12 hr tablet Take 1 tablet (600 mg total) by mouth 2 (two) times daily. 30 tablet 0  . Hydrocodone-Chlorpheniramine 5-4 MG/5ML SOLN Take 5 mLs by mouth 2 (two) times daily. 240 mL 0   No current facility-administered medications for this visit.   Facility-Administered Medications Ordered in Other Visits  Medication Dose Route Frequency Provider Last Rate Last Dose  . sodium chloride 0.9 % injection 10 mL  10 mL Intracatheter PRN Forest Gleason, MD      . sodium chloride 0.9 % injection 10 mL  10 mL Intracatheter PRN Forest Gleason, MD   10 mL at 09/11/14 1405  . sodium chloride 0.9 % injection 10 mL  10 mL Intracatheter PRN Forest Gleason, MD   10 mL at 09/26/14 1405    OBJECTIVE:  Filed Vitals:   11/09/14 1210  BP: 102/63  Pulse: 83  Temp:  97 F (36.1 C)     Body mass index is 25.26 kg/(m^2).    ECOG FS:1 - Symptomatic but completely ambulatory  PHYSICAL EXAM: Goal status: Performance status is good.  Patient has not lost significant weight HEENT: No evidence of stomatitis.   Sclera and conjunctivae :: No jaundice.   pale looking . Lungs: Crepitation and right base  Dullness on percussion on the right side.  Occasional rhonchi on both sides.  Cardiac: Heart sounds are normal.  No pericardial rub.  No murmur. Lymphatic system: Cervical, axillary, inguinal, lymph nodes not palpable GI: Abdomen is soft.  No ascites.  Liver spleen not palpable.  No tenderness.  Bowel sounds are within normal limit Lower extremity: No edema Neurological system: Higher functions, cranial nerves intact No evidence of peripheral neuropathy. Skin: No rash.  No ecchymosis.. Musculoskeletal system within normal limit    LAB RESULTS:  Hospital Outpatient Visit on 11/07/2014  Component Date Value Ref Range Status  . CYTOLOGY - NON GYN 11/07/2014    Final                   Value:Cytology - Non PAP CASE: [id number] PATIENT: Clifford Kueker Non-Gyn Cytology Report     SPECIMEN SUBMITTED: A. Pleural fluid, right  CLINICAL HISTORY: None provided  PRE-OPERATIVE DIAGNOSIS: Right pleural effusion  POST-OPERATIVE DIAGNOSIS: None Provided     DIAGNOSIS: A. PLEURAL FLUID, RIGHT; ULTRASOUND-GUIDED THORACENTESIS: - NEGATIVE FOR MALIGNANCY. - LYMPHOCYTES, A FEW NEUTROPHILS, AND RARE MACROPHAGES ARE PRESENT.  Comment: Slides reviewed: One cytospin slide, one ThinPrep slide, and slides from one cell block.   GROSS DESCRIPTION: A.  Specimen Labeled: right pleural fluid Volume: 270 mL Description: yellow to brown fluid Cell block(s): 1 and 1 ThinPrep Final Diagnosis performed by Bryan Lemma, MD.  Electronically signed 11/10/2014 6:00:01PM    The electronic signature indicates that the named Attending Pathologist has evaluated the  specimen  Technical component performed at [facility], [address], [place], [place] [address] Lab: [place]: Darrick Penna. Evette Doffing, MD  Professional component performed at [facility], [facility], [place], [place], [place] [address] Lab: [id number] Dir: Dellia Nims. Reuel Derby, MD    .  Total protein, fluid 11/07/2014 <3.0   Final   Comment: (NOTE) No normal range established for this test Results should be evaluated in conjunction with serum values   . Fluid Type-FTP 11/07/2014 CYTO PLEU   Final  . Fluid Type-FCT 11/07/2014 CYTO PLEU   Final  . Color, Fluid 11/07/2014 PINK* YELLOW Final  . Appearance, Fluid 11/07/2014 CLOUDY* CLEAR Final  . WBC, Fluid 11/07/2014 398   Final  . Neutrophil Count, Fluid 11/07/2014 13   Final  . Lymphs, Fluid 11/07/2014 79   Final  . Monocyte-Macrophage-Serous Fluid 11/07/2014 8   Final  . Eos, Fluid 11/07/2014 0   Final  . Other Cells, Fluid 11/07/2014 0   Final  . Path Review 11/07/2014 Please see cytology report.   Final   Reviewed by Lemmie Evens. Dicie Beam, MD.      STUDIES: Dg Chest 1 View  11/07/2014   CLINICAL DATA:  Status post right thoracentesis  EXAM: CHEST  1 VIEW  COMPARISON:  09/11/2014  FINDINGS: Cardiac shadow is stable. Stable consolidation in the right perihilar region is noted. A right chest wall port is again seen. There is been interval thoracentesis with creation of a air-fluid level within the right chest cavity. The degree of fluid is less than that seen on the prior exam and during the procedure appeared less than that noted on prior PET-CT. The air-fluid level is related to the recent thoracentesis but does not represent a true pneumothorax. It is related to incomplete re-expansion of the right lung. This area will likely refill with fluid and continue to have a multiloculated appearance. The left lung remains clear. No bony abnormality is seen.  IMPRESSION: Status post right thoracentesis  with some reduction in pleural fluid. Incomplete re-expansion of the right lung due to the underlying neoplasm is noted. No true pneumothorax is noted.   Electronically Signed   By: Inez Catalina M.D.   On: 11/07/2014 15:27   Mr Jeri Cos TI Contrast  10/26/2014   CLINICAL DATA:  Severe right-sided frontal headaches for 1 month. History of right lower lobe lung cancer.  EXAM: MRI HEAD WITHOUT AND WITH CONTRAST  TECHNIQUE: Multiplanar, multiecho pulse sequences of the brain and surrounding structures were obtained without and with intravenous contrast.  CONTRAST:  41m MULTIHANCE GADOBENATE DIMEGLUMINE 529 MG/ML IV SOLN  COMPARISON:  Head CT 10/08/2013 and MRI 01/23/2012  FINDINGS: There is an approximately 2 cm focus of T2 an diffusion-weighted hyperintensity involving left occipital cortex with normal to minimally restricted diffusion on the ADC map and no abnormal enhancement. There is mild-to-moderate cerebral atrophy, unchanged. Small foci of T2 hyperintensity in the cerebral white matter bilaterally and in the pons are similar to the prior MRI and nonspecific but compatible with minimal chronic small vessel ischemic disease. No intracranial hemorrhage, mass, midline shift, or extra-axial fluid collection is seen. No enhancing lesions are identified. Chronic lacunar infarcts are again seen in the thalami.  Prior right cataract extraction is noted. There is a chronic, moderate right mastoid effusion. Major intracranial vascular flow voids are preserved.  IMPRESSION: 1. Small, likely early subacute left occipital lobe cortical infarct. 2. No evidence of intracranial metastases. 3. Unchanged cerebral atrophy and chronic thalamic lacunar infarcts.   Electronically Signed   By: ALogan Bores  On: 10/26/2014 14:10   Nm Pet Image Restag (ps) Skull Base To Thigh  10/25/2014   CLINICAL DATA:  Subsequent treatment strategy for right lower lobe lung cancer.  EXAM: NUCLEAR MEDICINE PET  SKULL BASE TO THIGH  TECHNIQUE:  12.39 mCi F-18 FDG was injected intravenously. Full-ring PET imaging was performed from the skull base to thigh after the radiotracer. CT data was obtained and used for attenuation correction and anatomic localization.  FASTING BLOOD GLUCOSE:  Value: 102 mg/dl  COMPARISON:  PET-CT dated 07/13/2014  FINDINGS: NECK  No hypermetabolic lymph nodes in the neck.  CHEST  Cavitary central right lower lobe mass (series 3/ image 104), max SUV 15.1, previously 13.6.  Increased interstitial markings in the right upper lobe (series 3/ image 87), nonspecific but possibly reflecting lymphangitic spread of tumor, max SUV 3.3.  Underlying mild to moderate emphysematous changes.  Moderate right pleural effusion, partially loculated.  Cardiomegaly. No pericardial effusion. Coronary atherosclerosis. Atherosclerotic calcifications of the aortic arch. Right chest port terminates in the lower SVC.  Suspected 2.9 x 1.8 cm soft tissue lesion along the pericardium overlying the left atrium (series 3/ image 109), worrisome for metastasis, max SUV 18.2, previously 12.5.  ABDOMEN/PELVIS  No abnormal hypermetabolic activity within the liver, pancreas, adrenal glands, or spleen.  Atherosclerotic calcifications of the abdominal aorta and branch vessels. 3.5 cm infrarenal abdominal aortic aneurysm. Atherosclerotic plaque with narrowing at the portion of the SMA (series 3/image 132).  Hypermetabolic soft tissue along the left lateral aspect of the descending thoracic aorta just inferior to the aortic hiatus (series 3/image 148), max SUV 8.5, nonspecific/indeterminate. This appearance may reflect retroperitoneal nodal soft tissue (favored) versus a vascular finding.  Bilateral renal cysts.  No hypermetabolic lymph nodes in the abdomen or pelvis.  SKELETON  Focal hypermetabolism involving the L1 vertebral body, max SUV 5.9, new.  IMPRESSION: Suspected mild progression of disease.  Cavitary central right lower lobe mass, corresponding to known primary  bronchogenic neoplasm, grossly unchanged. Possible lymphangitic spread in the right upper lobe, equivocal. Associated moderate right pleural effusion.  Suspected pericardial metastasis overlying the left atrium, with increased hypermetabolism.  Hypermetabolic soft tissue along the left lateral aspect of the descending thoracic aorta, new, possibly reflecting retroperitoneal nodal metastasis (favored) versus a vascular finding. Attention on follow-up is suggested.  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   Electronically Signed   By: Julian Hy M.D.   On: 10/25/2014 12:03   US Thoracentesis Asp Pleural Space W/img Guide  11/07/2014   CLINICAL DATA:  Right-sided pleural effusion  EXAM: ULTRASOUND GUIDED right THORACENTESIS  PROCEDURE: An ultrasound guided thoracentesis was thoroughly discussed with the patient and questions answered. The benefits, risks, alternatives and complications were also discussed. The patient understands and wishes to proceed with the procedure. Written consent was obtained.  Ultrasound was performed to localize and mark an adequate pocket of fluid in the right chest. The area was then prepped and draped in the normal sterile fashion. 1% Lidocaine was used for local anesthesia. Under ultrasound guidance a 6 French thoracentesis catheter was introduced. Thoracentesis was performed. The catheter was removed and a dressing applied.  COMPLICATIONS: None  FINDINGS: A total of approximately 300 mL of somewhat bloody fluid was removed. A fluid sample was not sent for laboratory analysis.  IMPRESSION: Successful ultrasound guided right thoracentesis yielding 300 mL of pleural fluid.   Electronically Signed   By: Inez Catalina M.D.   On: 11/07/2014 15:22    ASSESSMENT: Recurrent squamous cell carcinoma of right lower lobe. In last 2 weeks patient's condition has somewhat declined possibility of intercurrent infection versus progressive disease COPD Diabetes  insulin-dependent Senile tremors( All the x-rays were reviewed MEDICAL  DECISION MAKING:  All lab data has been reviewed. After thoracentesis patient's shortness of breath is improved cytology is not available. If cytology is negative then patient will continue NIVOLULAMAB.  If cytology is positive then consideration of chemotherapy Patient expressed understanding and was in agreement with this plan. He also understands that He can call clinic at any time with any questions, concerns, or complaints.    Lung cancer   Staging form: Lung, AJCC 7th Edition     Clinical: Stage IV (T3, N2, M1b) - Signed by Curt Bears, MD on 05/25/2013     Pathologic: No stage assigned - Marni Griffon, MD   11/19/2014 12:53 PM

## 2014-11-24 ENCOUNTER — Other Ambulatory Visit: Payer: Self-pay | Admitting: Oncology

## 2014-11-25 ENCOUNTER — Other Ambulatory Visit: Payer: Self-pay | Admitting: Oncology

## 2014-11-27 ENCOUNTER — Telehealth: Payer: Self-pay | Admitting: *Deleted

## 2014-11-27 NOTE — Telephone Encounter (Signed)
called to report he has a knot on his back and asking if Dr Oliva Bustard wants to check him today

## 2014-11-27 NOTE — Telephone Encounter (Signed)
He has an appt Thursday for chemo and ok to wait and let md come over to infusion area to look at area

## 2014-11-30 ENCOUNTER — Inpatient Hospital Stay (HOSPITAL_BASED_OUTPATIENT_CLINIC_OR_DEPARTMENT_OTHER): Payer: Medicare Other | Admitting: Oncology

## 2014-11-30 ENCOUNTER — Inpatient Hospital Stay: Payer: Medicare Other | Attending: Oncology

## 2014-11-30 VITALS — BP 108/65 | HR 86 | Temp 97.4°F | Wt 186.9 lb

## 2014-11-30 DIAGNOSIS — N189 Chronic kidney disease, unspecified: Secondary | ICD-10-CM

## 2014-11-30 DIAGNOSIS — Z5111 Encounter for antineoplastic chemotherapy: Secondary | ICD-10-CM | POA: Insufficient documentation

## 2014-11-30 DIAGNOSIS — Z8701 Personal history of pneumonia (recurrent): Secondary | ICD-10-CM | POA: Insufficient documentation

## 2014-11-30 DIAGNOSIS — R222 Localized swelling, mass and lump, trunk: Secondary | ICD-10-CM | POA: Diagnosis not present

## 2014-11-30 DIAGNOSIS — K219 Gastro-esophageal reflux disease without esophagitis: Secondary | ICD-10-CM

## 2014-11-30 DIAGNOSIS — Z9221 Personal history of antineoplastic chemotherapy: Secondary | ICD-10-CM | POA: Insufficient documentation

## 2014-11-30 DIAGNOSIS — I129 Hypertensive chronic kidney disease with stage 1 through stage 4 chronic kidney disease, or unspecified chronic kidney disease: Secondary | ICD-10-CM | POA: Insufficient documentation

## 2014-11-30 DIAGNOSIS — C3431 Malignant neoplasm of lower lobe, right bronchus or lung: Secondary | ICD-10-CM | POA: Diagnosis not present

## 2014-11-30 DIAGNOSIS — I2510 Atherosclerotic heart disease of native coronary artery without angina pectoris: Secondary | ICD-10-CM | POA: Insufficient documentation

## 2014-11-30 DIAGNOSIS — Z923 Personal history of irradiation: Secondary | ICD-10-CM | POA: Diagnosis not present

## 2014-11-30 DIAGNOSIS — Z85828 Personal history of other malignant neoplasm of skin: Secondary | ICD-10-CM | POA: Diagnosis not present

## 2014-11-30 DIAGNOSIS — R0602 Shortness of breath: Secondary | ICD-10-CM | POA: Diagnosis not present

## 2014-11-30 DIAGNOSIS — Z87891 Personal history of nicotine dependence: Secondary | ICD-10-CM

## 2014-11-30 DIAGNOSIS — E785 Hyperlipidemia, unspecified: Secondary | ICD-10-CM | POA: Diagnosis not present

## 2014-11-30 DIAGNOSIS — Z794 Long term (current) use of insulin: Secondary | ICD-10-CM | POA: Insufficient documentation

## 2014-11-30 DIAGNOSIS — J449 Chronic obstructive pulmonary disease, unspecified: Secondary | ICD-10-CM | POA: Insufficient documentation

## 2014-11-30 DIAGNOSIS — Z79899 Other long term (current) drug therapy: Secondary | ICD-10-CM | POA: Diagnosis not present

## 2014-11-30 DIAGNOSIS — E119 Type 2 diabetes mellitus without complications: Secondary | ICD-10-CM

## 2014-11-30 DIAGNOSIS — Z809 Family history of malignant neoplasm, unspecified: Secondary | ICD-10-CM | POA: Diagnosis not present

## 2014-11-30 DIAGNOSIS — C799 Secondary malignant neoplasm of unspecified site: Secondary | ICD-10-CM | POA: Insufficient documentation

## 2014-11-30 DIAGNOSIS — C3490 Malignant neoplasm of unspecified part of unspecified bronchus or lung: Secondary | ICD-10-CM

## 2014-11-30 DIAGNOSIS — C3430 Malignant neoplasm of lower lobe, unspecified bronchus or lung: Secondary | ICD-10-CM

## 2014-11-30 MED ORDER — HEPARIN SOD (PORK) LOCK FLUSH 100 UNIT/ML IV SOLN
500.0000 [IU] | Freq: Once | INTRAVENOUS | Status: AC
  Administered 2014-11-30: 500 [IU] via INTRAVENOUS
  Filled 2014-11-30: qty 5

## 2014-11-30 MED ORDER — SODIUM CHLORIDE 0.9 % IV SOLN
3.0000 mg/kg | Freq: Once | INTRAVENOUS | Status: AC
  Administered 2014-11-30: 260 mg via INTRAVENOUS
  Filled 2014-11-30: qty 20

## 2014-11-30 MED ORDER — SODIUM CHLORIDE 0.9 % IJ SOLN
10.0000 mL | INTRAMUSCULAR | Status: DC | PRN
  Administered 2014-11-30: 10 mL via INTRAVENOUS
  Filled 2014-11-30: qty 10

## 2014-11-30 MED ORDER — SODIUM CHLORIDE 0.9 % IV SOLN
Freq: Once | INTRAVENOUS | Status: AC
Start: 2014-11-30 — End: 2014-11-30
  Administered 2014-11-30: 14:00:00 via INTRAVENOUS
  Filled 2014-11-30: qty 1000

## 2014-11-30 NOTE — Progress Notes (Signed)
Patient does have living will.  Former smoker. Patient here today for assessment of knot on his back.

## 2014-12-03 ENCOUNTER — Encounter: Payer: Self-pay | Admitting: Oncology

## 2014-12-03 NOTE — Progress Notes (Signed)
[place] @ [facility] [phone]  Fax:([phone]) [place]: 12/08/1930  MR#: [id number]  JGG#:[id number]  Patient Care Team: Derinda Late, MD as PCP - General (Family Medicine)  CHIEF COMPLAINT:  Chief Complaint  Patient presents with  . Follow-up    Oncology History   The patient is an 81 year old gentleman with stage IV squamous cell carcinoma of the lung who is seen for assessment prior to Nivolumab.  1. Squamous cell carcinoma of lung. Bronchoscopies positive for right lower lobe mass. Started on radiation therapy and chemotherapy.  August, 2012 Carboplatinum (AUC of 2) and Taxol with concurrent radiation therapy 2. Finished chemo /  radiation  in sept 2012. 3. Bronchoscopy was negative for any  malignant cells (August, 2013) 4. Continuing hemoptysis.  Repeat bronchoscopy is positive for malignant cells consistent with squamous cell carcinoma. 5. Palliative radiation and chemotherapy with carboplatinum and Taxol.(October, 2013) has finished radiation therapy and Taxol, carboplatinum in November of 2013 6.veristrat test is good 7.on Tarceva 8.progressing disease on Tarceva.  (January, 2015) discontinue Tarceva. 9.started on the investigational protocol with  [facility] February, 2015 10Patient is off protocol and continuing NIVO as. commercial product 11.  Complete evaluation during July 2 016 including thoracentesis cytology was negative for malignant cells     Lung cancer   12/24/2010 Initial Diagnosis Lung cancer    Oncology Flowsheet 10/06/2013 08/28/2014 09/11/2014 09/26/2014 10/09/2014 11/16/2014 11/30/2014  INV-nivolumab (BMS [id number]) IV 3 mg/kg - - - - - -  nivolumab (OPDIVO) IV - 3 mg/kg 3 mg/kg 3 mg/kg 3 mg/kg 3 mg/kg 3 mg/kg   changes.  Was also reviewed with the patient and compared with the previous  INTERVAL HISTORY: 81 year old gentleman with squamous cell carcinoma of lung recurrent disease on NIVOLULAMAB.  Tolerating treatment  very well without any significant nausea vomiting diarrhea.  Cough and shortness of breath is improved.  Here for further follow-up and treatment consideration had a repeat chest x-ray which has been evaluated. Chest x-rays been independently reviewed and compared with the previous chest x-ray and so stable disease.  No rash.  No diarrhea. October 23, 2014 Patient started having cough and increasing shortness of breath and low-grade fever cough with expectoration.  Feeling weak and tired for last 2 weeks.  No chills.  No hemoptysis. Patient was supposed to continue new or Leyla mapped today Chest pain October 31, 2014 Patient is here for ongoing evaluation and treatment consideration.  Head MRI scan of brain which revealed old occipital stroke patient was started on Plavix. Chest CT scan also has been done which revealed pleural effusion on the right side and increase metabolic activity of the right lower lobe lung mass Patient continues to feel weak and tired increasing shortness of breath and cough. Poor appetite November 16, 2014 Patient is here for further follow-up thoracentesis reveals negative cytology. We will continue patient on you on a low man August, 2016 Patient is seen for ongoing evaluation and treatment consideration complains of nodule on the back.  No chills.  No fever.  Here for continuation of treatment with NIVOLULAMAB no diarrhea.  No rash.  No shortness of breath. Ry hacking cough is improved  REVIEW OF SYSTEMS:   Gen. status: Feeling weak and tired low-grade fever cardiac: No chest pain no palpitation HEENT: No soreness in the marked no difficulty swallowing.  GI: Nausea no vomiting no diarrhea skin: No rash. Lungs: Increasing cough.  Shortness of breath.  Low-grade fever.  Cough with expectoration. Abdomen: No diarrhea. Musculoskeletal system no bony pains. Diabetes is under well control all the blood sugar values have been reviewed.  Lower extremity no swelling.  Patient does  have senile tremors. Appetite is poor Neurological system no headache or dizziness no other local symptoms.  All other systems have been reviewed  As per HPI. Otherwise, a complete review of systems is negatve.  PAST MEDICAL HISTORY: Past Medical History  Diagnosis Date  . Hypercholesteremia   . Hypertension   . Diabetes mellitus without complication   . AAA (abdominal aortic aneurysm)   . Lung cancer     09/2010  . Skin cancer   . CAD (coronary artery disease) stent placed  . COPD (chronic obstructive pulmonary disease)   . Shortness of breath dyspnea   . Pneumonia   . Chronic kidney disease   . GERD (gastroesophageal reflux disease)   . Headache     PAST SURGICAL HISTORY: Past Surgical History  Procedure Laterality Date  . Abdominal aortic aneurysm repair  2000  . Coronary stent placement  2000  . Tonsillectomy      when he was a child    FAMILY HISTORY Family History  Problem Relation Age of Onset  . Cancer Mother   . Cancer Sister   . Cancer Brother   . Cancer Maternal Aunt     Smoking History quit 10 years ago smoked 2 packs a day(1)  PFSH: Comments: family history of breast cancer and melanoma.  Comments: patient had smoked for 40 years one pack per day, quit smoking 10 years ago.  Does not drink  Additional Past Medical and Surgical History: coronary artery disease with stent placement.    Non-insulin-dependent diabetes.    Hypertension.    Multiple skin cancers removed including melanoma.    Aneurysm repair.  Hypercholesteremia     ADVANCED DIRECTIVES: Patient does have advanced healthcare directive   HEALTH MAINTENANCE: History  Substance Use Topics  . Smoking status: Former Smoker    Types: Cigarettes, Cigars    Quit date: 04/29/2003  . Smokeless tobacco: Not on file  . Alcohol Use: No       Allergies  Allergen Reactions  . Penicillin G Rash  . Sulfa Antibiotics Rash    Current Outpatient Prescriptions  Medication Sig Dispense  Refill  . ADVAIR DISKUS 250-50 MCG/DOSE AEPB INHALE 1 PUFF TWICE A DAY 60 each 0  . benzonatate (TESSALON) 200 MG capsule Take 200 mg by mouth 3 (three) times daily as needed for cough.    . citalopram (CELEXA) 10 MG tablet TAKE 1 TABLET BY MOUTH AT BEDTIME 30 tablet 5  . clopidogrel (PLAVIX) 75 MG tablet Take by mouth.    . fluticasone (FLONASE) 50 MCG/ACT nasal spray 1 spray. 1 spray in each nostril BID    . furosemide (LASIX) 20 MG tablet     . guaiFENesin (MUCINEX) 600 MG 12 hr tablet Take 1 tablet (600 mg total) by mouth 2 (two) times daily. 30 tablet 0  . HUMALOG KWIKPEN 100 UNIT/ML KiwkPen CHECK BLOOD SUGAR BEFORE EACH MEAL. INJECT UNDER THE SKIN PER SLIDING SCALE. MAX 30 U PER DAY  2  . Hydrocodone-Chlorpheniramine 5-4 MG/5ML SOLN Take 5 mLs by mouth 2 (two) times daily. 240 mL 0  . ipratropium-albuterol (DUONEB) 0.5-2.5 (3) MG/3ML SOLN Take 3 mLs by nebulization every 8 (eight) hours as needed.    Marland Kitchen LANTUS SOLOSTAR 100 UNIT/ML Solostar Pen 22 Units. 22 units at bedtime    .  lidocaine-prilocaine (EMLA) cream Apply to affected area once 30 g 3  . losartan (COZAAR) 50 MG tablet TAKE 1 TABLET BY MOUTH ONCE A DAY AS DIRECTED  5  . metoprolol succinate (TOPROL-XL) 100 MG 24 hr tablet Take 100 mg by mouth daily.    Marland Kitchen omeprazole (PRILOSEC) 40 MG capsule TAKE 1 CAPSULE (40 MG TOTAL) BY MOUTH DAILY. 30 capsule 0  . predniSONE (DELTASONE) 10 MG tablet 1 TAB(S) ORALLY ONCE A DAY  3  . PROAIR HFA 108 (90 BASE) MCG/ACT inhaler Inhale 2 puffs into the lungs 4 (four) times daily as needed.    Marland Kitchen SPIRIVA HANDIHALER 18 MCG inhalation capsule INHALE 1 CAPSULE WITH HANDIHALER ONCE A DAY 30 capsule 5  . tamsulosin (FLOMAX) 0.4 MG CAPS capsule Take 0.4 mg by mouth daily.  7   No current facility-administered medications for this visit.   Facility-Administered Medications Ordered in Other Visits  Medication Dose Route Frequency Provider Last Rate Last Dose  . sodium chloride 0.9 % injection 10 mL  10 mL  Intracatheter PRN Forest Gleason, MD      . sodium chloride 0.9 % injection 10 mL  10 mL Intracatheter PRN Forest Gleason, MD   10 mL at 09/11/14 1405  . sodium chloride 0.9 % injection 10 mL  10 mL Intracatheter PRN Forest Gleason, MD   10 mL at 09/26/14 1405    OBJECTIVE:  Filed Vitals:   11/30/14 1339  BP: 108/65  Pulse: 86  Temp: 97.4 F (36.3 C)     Body mass index is 26.09 kg/(m^2).    ECOG FS:1 - Symptomatic but completely ambulatory  PHYSICAL EXAM: Goal status: Performance status is good.  Patient has not lost significant weight HEENT: No evidence of stomatitis.   Sclera and conjunctivae :: No jaundice.   pale looking . Lungs: Crepitation and right base  Dullness on percussion on the right side.  Occasional rhonchi on both sides.  Cardiac: Heart sounds are normal.  No pericardial rub.  No murmur. Lymphatic system: Cervical, axillary, inguinal, lymph nodes not palpable GI: Abdomen is soft.  No ascites.  Liver spleen not palpable.  No tenderness.  Bowel sounds are within normal limit Lower extremity: No edema Neurological system: Higher functions, cranial nerves intact No evidence of peripheral neuropathy. Skin: No rash.  No ecchymosis.. Musculoskeletal system within normal limit    LAB RESULTS:  No visits with results within 3 Day(s) from this visit. Latest known visit with results is:  Hospital Outpatient Visit on 11/07/2014  Component Date Value Ref Range Status  . CYTOLOGY - NON GYN 11/07/2014    Final                   Value:Cytology - Non PAP CASE: [id number] PATIENT: [name] [name] Non-Gyn Cytology Report     SPECIMEN SUBMITTED: A. Pleural fluid, right  CLINICAL HISTORY: None provided  PRE-OPERATIVE DIAGNOSIS: Right pleural effusion  POST-OPERATIVE DIAGNOSIS: None Provided     DIAGNOSIS: A. PLEURAL FLUID, RIGHT; ULTRASOUND-GUIDED THORACENTESIS: - NEGATIVE FOR MALIGNANCY. - LYMPHOCYTES, A FEW NEUTROPHILS, AND RARE MACROPHAGES ARE  PRESENT.  Comment: Slides reviewed: One cytospin slide, one ThinPrep slide, and slides from one cell block.   GROSS DESCRIPTION: A.  Specimen Labeled: right pleural fluid Volume: 270 mL Description: yellow to brown fluid Cell block(s): 1 and 1 ThinPrep Final Diagnosis performed by Bryan Lemma, MD.  Electronically signed 11/10/2014 6:00:01PM    The electronic signature indicates that the named Attending Pathologist has evaluated the  specimen  Technical component performed at [facility], [address], [place], [place] [address] Lab: [phone] Dir: Darrick Penna. Evette Doffing, MD  Professional component performed at [facility], [facility], [place], [place], [place] [address] Lab: [phone] Dir: Dellia Nims. Rubinas, MD    . Total protein, fluid 11/07/2014 <3.0   Final   Comment: (NOTE) No normal range established for this test Results should be evaluated in conjunction with serum values   . Fluid Type-FTP 11/07/2014 CYTO PLEU   Final  . Fluid Type-FCT 11/07/2014 CYTO PLEU   Final  . Color, Fluid 11/07/2014 PINK* YELLOW Final  . Appearance, Fluid 11/07/2014 CLOUDY* CLEAR Final  . WBC, Fluid 11/07/2014 398   Final  . Neutrophil Count, Fluid 11/07/2014 13   Final  . Lymphs, Fluid 11/07/2014 79   Final  . Monocyte-Macrophage-Serous Fluid 11/07/2014 8   Final  . Eos, Fluid 11/07/2014 0   Final  . Other Cells, Fluid 11/07/2014 0   Final  . Path Review 11/07/2014 Please see cytology report.   Final   Reviewed by Lemmie Evens. Dicie Beam, MD.      STUDIES: Dg Chest 1 View  11/07/2014   CLINICAL DATA:  Status post right thoracentesis  EXAM: CHEST  1 VIEW  COMPARISON:  09/11/2014  FINDINGS: Cardiac shadow is stable. Stable consolidation in the right perihilar region is noted. A right chest wall port is again seen. There is been interval thoracentesis with creation of a air-fluid level within the right chest cavity. The degree of fluid is less than that  seen on the prior exam and during the procedure appeared less than that noted on prior PET-CT. The air-fluid level is related to the recent thoracentesis but does not represent a true pneumothorax. It is related to incomplete re-expansion of the right lung. This area will likely refill with fluid and continue to have a multiloculated appearance. The left lung remains clear. No bony abnormality is seen.  IMPRESSION: Status post right thoracentesis with some reduction in pleural fluid. Incomplete re-expansion of the right lung due to the underlying neoplasm is noted. No true pneumothorax is noted.   Electronically Signed   By: Inez Catalina M.D.   On: 11/07/2014 15:27   US Thoracentesis Asp Pleural Space W/img Guide  11/07/2014   CLINICAL DATA:  Right-sided pleural effusion  EXAM: ULTRASOUND GUIDED right THORACENTESIS  PROCEDURE: An ultrasound guided thoracentesis was thoroughly discussed with the patient and questions answered. The benefits, risks, alternatives and complications were also discussed. The patient understands and wishes to proceed with the procedure. Written consent was obtained.  Ultrasound was performed to localize and mark an adequate pocket of fluid in the right chest. The area was then prepped and draped in the normal sterile fashion. 1% Lidocaine was used for local anesthesia. Under ultrasound guidance a 6 French thoracentesis catheter was introduced. Thoracentesis was performed. The catheter was removed and a dressing applied.  COMPLICATIONS: None  FINDINGS: A total of approximately 300 mL of somewhat bloody fluid was removed. A fluid sample was not sent for laboratory analysis.  IMPRESSION: Successful ultrasound guided right thoracentesis yielding 300 mL of pleural fluid.   Electronically Signed   By: Inez Catalina M.D.   On: 11/07/2014 15:22    ASSESSMENT: Recurrent squamous cell carcinoma of right lower lobe. In last 2 weeks patient's condition has  somewhat declined possibility of  intercurrent infection versus progressive disease COPD Diabetes insulin-dependent Senile tremors( All the x-rays were reviewed Small nodule on the back appears to be a sebaceous cyst but will be carefully followed in view of patient's history of stage IV carcinoma of lung MEDICAL DECISION MAKING:  Thoracentesis was negative for malignant cells.  There is no clear-cut evidence of progressive tumor Will continue patient on NIVOLULAMAB   Lung cancer   Staging form: Lung, AJCC 7th Edition     Clinical: Stage IV (T3, N2, M1b) - Signed by Curt Bears, MD on 05/25/2013     Pathologic: No stage assigned - Marni Griffon, MD   12/03/2014 9:18 PM

## 2014-12-14 ENCOUNTER — Inpatient Hospital Stay: Payer: Medicare Other

## 2014-12-14 ENCOUNTER — Ambulatory Visit: Payer: Medicare Other

## 2014-12-14 ENCOUNTER — Ambulatory Visit: Payer: Medicare Other | Admitting: Oncology

## 2014-12-14 ENCOUNTER — Other Ambulatory Visit: Payer: Self-pay | Admitting: Oncology

## 2014-12-14 DIAGNOSIS — C3490 Malignant neoplasm of unspecified part of unspecified bronchus or lung: Secondary | ICD-10-CM

## 2014-12-14 DIAGNOSIS — Z5111 Encounter for antineoplastic chemotherapy: Secondary | ICD-10-CM | POA: Diagnosis not present

## 2014-12-14 DIAGNOSIS — C3430 Malignant neoplasm of lower lobe, unspecified bronchus or lung: Secondary | ICD-10-CM

## 2014-12-14 LAB — BASIC METABOLIC PANEL
ANION GAP: 7 (ref 5–15)
BUN: 24 mg/dL — ABNORMAL HIGH (ref 6–20)
CHLORIDE: 106 mmol/L (ref 101–111)
CO2: 26 mmol/L (ref 22–32)
CREATININE: 1.51 mg/dL — AB (ref 0.61–1.24)
Calcium: 8.4 mg/dL — ABNORMAL LOW (ref 8.9–10.3)
GFR calc non Af Amer: 42 mL/min — ABNORMAL LOW (ref >60)
GFR, EST AFRICAN AMERICAN: 48 mL/min — AB (ref >60)
Glucose, Bld: 269 mg/dL — ABNORMAL HIGH (ref 65–99)
Potassium: 4 mmol/L (ref 3.5–5.1)
SODIUM: 139 mmol/L (ref 135–145)

## 2014-12-14 LAB — CBC WITH DIFFERENTIAL/PLATELET
BASOS PCT: 1 %
Basophils Absolute: 0.1 10*3/uL (ref 0–0.1)
Eosinophils Absolute: 0 10*3/uL (ref 0–0.7)
Eosinophils Relative: 0 %
HCT: 37.7 % — ABNORMAL LOW (ref 40.0–52.0)
HEMOGLOBIN: 12 g/dL — AB (ref 13.0–18.0)
Lymphocytes Relative: 2 %
Lymphs Abs: 0.3 10*3/uL — ABNORMAL LOW (ref 1.0–3.6)
MCH: 27.9 pg (ref 26.0–34.0)
MCHC: 31.8 g/dL — AB (ref 32.0–36.0)
MCV: 87.6 fL (ref 80.0–100.0)
MONOS PCT: 4 %
Monocytes Absolute: 0.5 10*3/uL (ref 0.2–1.0)
NEUTROS ABS: 11.3 10*3/uL — AB (ref 1.4–6.5)
NEUTROS PCT: 93 %
Platelets: 141 10*3/uL — ABNORMAL LOW (ref 150–440)
RBC: 4.31 MIL/uL — ABNORMAL LOW (ref 4.40–5.90)
RDW: 17 % — ABNORMAL HIGH (ref 11.5–14.5)
WBC: 12.2 10*3/uL — AB (ref 3.8–10.6)

## 2014-12-14 MED ORDER — SODIUM CHLORIDE 0.9 % IJ SOLN
10.0000 mL | INTRAMUSCULAR | Status: DC | PRN
  Administered 2014-12-14: 10 mL via INTRAVENOUS
  Filled 2014-12-14: qty 10

## 2014-12-14 MED ORDER — SODIUM CHLORIDE 0.9 % IV SOLN
3.0000 mg/kg | Freq: Once | INTRAVENOUS | Status: AC
  Administered 2014-12-14: 260 mg via INTRAVENOUS
  Filled 2014-12-14: qty 26

## 2014-12-14 MED ORDER — HEPARIN SOD (PORK) LOCK FLUSH 100 UNIT/ML IV SOLN
500.0000 [IU] | Freq: Once | INTRAVENOUS | Status: AC
  Administered 2014-12-14: 500 [IU] via INTRAVENOUS
  Filled 2014-12-14: qty 5

## 2014-12-14 MED ORDER — SODIUM CHLORIDE 0.9 % IV SOLN
Freq: Once | INTRAVENOUS | Status: AC
  Administered 2014-12-14: 15:00:00 via INTRAVENOUS
  Filled 2014-12-14: qty 1000

## 2014-12-18 ENCOUNTER — Other Ambulatory Visit: Payer: Self-pay | Admitting: Oncology

## 2014-12-23 ENCOUNTER — Other Ambulatory Visit: Payer: Self-pay | Admitting: Oncology

## 2014-12-28 ENCOUNTER — Inpatient Hospital Stay (HOSPITAL_BASED_OUTPATIENT_CLINIC_OR_DEPARTMENT_OTHER): Payer: Medicare Other | Admitting: Oncology

## 2014-12-28 ENCOUNTER — Inpatient Hospital Stay: Payer: Medicare Other | Attending: Oncology

## 2014-12-28 ENCOUNTER — Inpatient Hospital Stay: Payer: Medicare Other

## 2014-12-28 ENCOUNTER — Encounter: Payer: Self-pay | Admitting: Oncology

## 2014-12-28 VITALS — BP 110/67 | HR 88 | Temp 97.4°F | Wt 187.5 lb

## 2014-12-28 DIAGNOSIS — Z923 Personal history of irradiation: Secondary | ICD-10-CM | POA: Diagnosis not present

## 2014-12-28 DIAGNOSIS — I10 Essential (primary) hypertension: Secondary | ICD-10-CM | POA: Insufficient documentation

## 2014-12-28 DIAGNOSIS — Z7952 Long term (current) use of systemic steroids: Secondary | ICD-10-CM | POA: Insufficient documentation

## 2014-12-28 DIAGNOSIS — R509 Fever, unspecified: Secondary | ICD-10-CM

## 2014-12-28 DIAGNOSIS — I129 Hypertensive chronic kidney disease with stage 1 through stage 4 chronic kidney disease, or unspecified chronic kidney disease: Secondary | ICD-10-CM

## 2014-12-28 DIAGNOSIS — I714 Abdominal aortic aneurysm, without rupture: Secondary | ICD-10-CM

## 2014-12-28 DIAGNOSIS — R1310 Dysphagia, unspecified: Secondary | ICD-10-CM | POA: Diagnosis not present

## 2014-12-28 DIAGNOSIS — R5383 Other fatigue: Secondary | ICD-10-CM | POA: Insufficient documentation

## 2014-12-28 DIAGNOSIS — Z87891 Personal history of nicotine dependence: Secondary | ICD-10-CM | POA: Insufficient documentation

## 2014-12-28 DIAGNOSIS — J90 Pleural effusion, not elsewhere classified: Secondary | ICD-10-CM | POA: Insufficient documentation

## 2014-12-28 DIAGNOSIS — Z79899 Other long term (current) drug therapy: Secondary | ICD-10-CM

## 2014-12-28 DIAGNOSIS — R531 Weakness: Secondary | ICD-10-CM

## 2014-12-28 DIAGNOSIS — E119 Type 2 diabetes mellitus without complications: Secondary | ICD-10-CM | POA: Diagnosis not present

## 2014-12-28 DIAGNOSIS — C3431 Malignant neoplasm of lower lobe, right bronchus or lung: Secondary | ICD-10-CM

## 2014-12-28 DIAGNOSIS — Z809 Family history of malignant neoplasm, unspecified: Secondary | ICD-10-CM | POA: Diagnosis not present

## 2014-12-28 DIAGNOSIS — I2510 Atherosclerotic heart disease of native coronary artery without angina pectoris: Secondary | ICD-10-CM | POA: Diagnosis not present

## 2014-12-28 DIAGNOSIS — Z85828 Personal history of other malignant neoplasm of skin: Secondary | ICD-10-CM | POA: Diagnosis not present

## 2014-12-28 DIAGNOSIS — E780 Pure hypercholesterolemia: Secondary | ICD-10-CM

## 2014-12-28 DIAGNOSIS — F329 Major depressive disorder, single episode, unspecified: Secondary | ICD-10-CM | POA: Diagnosis not present

## 2014-12-28 DIAGNOSIS — C3432 Malignant neoplasm of lower lobe, left bronchus or lung: Secondary | ICD-10-CM

## 2014-12-28 DIAGNOSIS — C3430 Malignant neoplasm of lower lobe, unspecified bronchus or lung: Secondary | ICD-10-CM

## 2014-12-28 DIAGNOSIS — K219 Gastro-esophageal reflux disease without esophagitis: Secondary | ICD-10-CM

## 2014-12-28 DIAGNOSIS — R0602 Shortness of breath: Secondary | ICD-10-CM | POA: Diagnosis not present

## 2014-12-28 DIAGNOSIS — N189 Chronic kidney disease, unspecified: Secondary | ICD-10-CM

## 2014-12-28 DIAGNOSIS — R05 Cough: Secondary | ICD-10-CM | POA: Insufficient documentation

## 2014-12-28 DIAGNOSIS — Z5111 Encounter for antineoplastic chemotherapy: Secondary | ICD-10-CM | POA: Diagnosis present

## 2014-12-28 DIAGNOSIS — J449 Chronic obstructive pulmonary disease, unspecified: Secondary | ICD-10-CM | POA: Diagnosis not present

## 2014-12-28 DIAGNOSIS — Z794 Long term (current) use of insulin: Secondary | ICD-10-CM

## 2014-12-28 DIAGNOSIS — C3490 Malignant neoplasm of unspecified part of unspecified bronchus or lung: Secondary | ICD-10-CM

## 2014-12-28 LAB — COMPREHENSIVE METABOLIC PANEL
ALBUMIN: 3.6 g/dL (ref 3.5–5.0)
ALK PHOS: 56 U/L (ref 38–126)
ALT: 14 U/L — AB (ref 17–63)
AST: 18 U/L (ref 15–41)
Anion gap: 4 — ABNORMAL LOW (ref 5–15)
BUN: 25 mg/dL — AB (ref 6–20)
CALCIUM: 8 mg/dL — AB (ref 8.9–10.3)
CHLORIDE: 103 mmol/L (ref 101–111)
CO2: 28 mmol/L (ref 22–32)
CREATININE: 1.33 mg/dL — AB (ref 0.61–1.24)
GFR calc Af Amer: 56 mL/min — ABNORMAL LOW (ref >60)
GFR calc non Af Amer: 48 mL/min — ABNORMAL LOW (ref >60)
GLUCOSE: 278 mg/dL — AB (ref 65–99)
Potassium: 4 mmol/L (ref 3.5–5.1)
SODIUM: 135 mmol/L (ref 135–145)
Total Bilirubin: 0.5 mg/dL (ref 0.3–1.2)
Total Protein: 6.3 g/dL — ABNORMAL LOW (ref 6.5–8.1)

## 2014-12-28 LAB — CBC WITH DIFFERENTIAL/PLATELET
BASOS ABS: 0.1 10*3/uL (ref 0–0.1)
Basophils Relative: 1 %
EOS ABS: 0.1 10*3/uL (ref 0–0.7)
EOS PCT: 1 %
HCT: 38.9 % — ABNORMAL LOW (ref 40.0–52.0)
HEMOGLOBIN: 12.5 g/dL — AB (ref 13.0–18.0)
LYMPHS ABS: 0.4 10*3/uL — AB (ref 1.0–3.6)
Lymphocytes Relative: 3 %
MCH: 28.1 pg (ref 26.0–34.0)
MCHC: 32.1 g/dL (ref 32.0–36.0)
MCV: 87.5 fL (ref 80.0–100.0)
Monocytes Absolute: 0.5 10*3/uL (ref 0.2–1.0)
Monocytes Relative: 4 %
NEUTROS PCT: 91 %
Neutro Abs: 10.2 10*3/uL — ABNORMAL HIGH (ref 1.4–6.5)
PLATELETS: 139 10*3/uL — AB (ref 150–440)
RBC: 4.45 MIL/uL (ref 4.40–5.90)
RDW: 16.6 % — ABNORMAL HIGH (ref 11.5–14.5)
WBC: 11.2 10*3/uL — AB (ref 3.8–10.6)

## 2014-12-28 MED ORDER — CITALOPRAM HYDROBROMIDE 20 MG PO TABS: 20.0000 mg | ORAL_TABLET | Freq: Every day | ORAL | Status: DC

## 2014-12-28 MED ORDER — SODIUM CHLORIDE 0.9 % IV SOLN
3.0000 mg/kg | Freq: Once | INTRAVENOUS | Status: AC
  Administered 2014-12-28: 260 mg via INTRAVENOUS
  Filled 2014-12-28: qty 6

## 2014-12-28 MED ORDER — HEPARIN SOD (PORK) LOCK FLUSH 100 UNIT/ML IV SOLN
500.0000 [IU] | Freq: Once | INTRAVENOUS | Status: AC
Start: 2014-12-28 — End: 2014-12-28

## 2014-12-28 MED ORDER — HEPARIN SOD (PORK) LOCK FLUSH 100 UNIT/ML IV SOLN
INTRAVENOUS | Status: AC
  Filled 2014-12-28: qty 5

## 2014-12-28 MED ORDER — SODIUM CHLORIDE 0.9 % IV SOLN
Freq: Once | INTRAVENOUS | Status: AC
  Administered 2014-12-28: 14:00:00 via INTRAVENOUS
  Filled 2014-12-28: qty 1000

## 2014-12-28 MED ORDER — SODIUM CHLORIDE 0.9 % IJ SOLN
10.0000 mL | INTRAMUSCULAR | Status: DC | PRN
  Administered 2014-12-28: 10 mL via INTRAVENOUS
  Filled 2014-12-28: qty 10

## 2014-12-28 MED ORDER — HEPARIN SOD (PORK) LOCK FLUSH 100 UNIT/ML IV SOLN
500.0000 [IU] | Freq: Once | INTRAVENOUS | Status: AC | PRN
  Administered 2014-12-28: 500 [IU]

## 2014-12-28 NOTE — Progress Notes (Signed)
Patient does have living will.  Former smoker.

## 2014-12-29 ENCOUNTER — Encounter: Payer: Self-pay | Admitting: Oncology

## 2014-12-29 NOTE — Progress Notes (Signed)
[place] @ [facility] [phone]) [phone]  Fax:(336) [place]: 1931/01/26  MR#: [id number]  IZT#:[id number]  Patient Care Team: Derinda Late, MD as PCP - General (Family Medicine)  CHIEF COMPLAINT:  Chief Complaint  Patient presents with  . Follow-up    Oncology History   The patient is an 81 year old gentleman with stage IV squamous cell carcinoma of the lung who is seen for assessment prior to Nivolumab.  1. Squamous cell carcinoma of lung. Bronchoscopies positive for right lower lobe mass. Started on radiation therapy and chemotherapy.  August, 2012 Carboplatinum (AUC of 2) and Taxol with concurrent radiation therapy 2. Finished chemo /  radiation  in sept 2012. 3. Bronchoscopy was negative for any  malignant cells (August, 2013) 4. Continuing hemoptysis.  Repeat bronchoscopy is positive for malignant cells consistent with squamous cell carcinoma. 5. Palliative radiation and chemotherapy with carboplatinum and Taxol.(October, 2013) has finished radiation therapy and Taxol, carboplatinum in November of 2013 6.veristrat test is good 7.on Tarceva 8.progressing disease on Tarceva.  (January, 2015) discontinue Tarceva. 9.started on the investigational protocol with  [facility] February, 2015 10Patient is off protocol and continuing NIVO as. commercial product 11.  Complete evaluation during July 2 016 including thoracentesis cytology was negative for malignant cells     Lung cancer   12/24/2010 Initial Diagnosis Lung cancer    Oncology Flowsheet 09/11/2014 09/26/2014 10/09/2014 11/16/2014 11/30/2014 12/14/2014 12/28/2014  INV-nivolumab (BMS [id number]) IV - - - - - - -  nivolumab (OPDIVO) IV 3 mg/kg 3 mg/kg 3 mg/kg 3 mg/kg 3 mg/kg 3 mg/kg 3 mg/kg   changes.  Was also reviewed with the patient and compared with the previous  INTERVAL HISTORY: 81 year old gentleman with squamous cell carcinoma of lung recurrent disease on NIVOLULAMAB.  Tolerating treatment  very well without any significant nausea vomiting diarrhea.  Cough and shortness of breath is improved.  Here for further follow-up and treatment consideration had a repeat chest x-ray which has been evaluated. Chest x-rays been independently reviewed and compared with the previous chest x-ray and so stable disease.  No rash.  No diarrhea. October 23, 2014 Patient started having cough and increasing shortness of breath and low-grade fever cough with expectoration.  Feeling weak and tired for last 2 weeks.  No chills.  No hemoptysis. Patient was supposed to continue new or Leyla mapped today Chest pain October 31, 2014 Patient is here for ongoing evaluation and treatment consideration.  Head MRI scan of brain which revealed old occipital stroke patient was started on Plavix. Chest CT scan also has been done which revealed pleural effusion on the right side and increase metabolic activity of the right lower lobe lung mass Patient continues to feel weak and tired increasing shortness of breath and cough. Poor appetite November 16, 2014 Patient is here for further follow-up thoracentesis reveals negative cytology. We will continue patient on you on a low man August, 2016 Patient is seen for ongoing evaluation and treatment consideration complains of nodule on the back.  No chills.  No fever.  Here for continuation of treatment with NIVOLULAMAB no diarrhea.  No rash.  No shortness of breath.  September, 2016 Patient is here for ongoing evaluation and treatment consideration.  According to patient's wife he is more depressed at present time.  Dry hacking cough is improved but shortness of breath persists.  No nausea no vomiting or diarrhea no hemoptysis.  REVIEW OF SYSTEMS:   Gen. status: Feeling weak and  tired low-grade fever cardiac: No chest pain no palpitation HEENT: No soreness in the marked no difficulty swallowing.  GI: Nausea no vomiting no diarrhea skin: No rash. Lungs: Increasing cough.  Shortness of  breath.  Low-grade fever.  Cough with expectoration. Abdomen: No diarrhea. Musculoskeletal system no bony pains. Diabetes is under well control all the blood sugar values have been reviewed.  Lower extremity no swelling.  Patient does have senile tremors. Appetite is poor Neurological system no headache or dizziness no other local symptoms.  All other systems have been reviewed  Patient is somewhat more depressed. HEENT: No soreness in the mouth or difficulty swallowing As per HPI. Otherwise, a complete review of systems is negatve.  PAST MEDICAL HISTORY: Past Medical History  Diagnosis Date  . Hypercholesteremia   . Hypertension   . Diabetes mellitus without complication   . AAA (abdominal aortic aneurysm)   . Lung cancer     09/2010  . Skin cancer   . CAD (coronary artery disease) stent placed  . COPD (chronic obstructive pulmonary disease)   . Shortness of breath dyspnea   . Pneumonia   . Chronic kidney disease   . GERD (gastroesophageal reflux disease)   . Headache     PAST SURGICAL HISTORY: Past Surgical History  Procedure Laterality Date  . Abdominal aortic aneurysm repair  2000  . Coronary stent placement  2000  . Tonsillectomy      when he was a child    FAMILY HISTORY Family History  Problem Relation Age of Onset  . Cancer Mother   . Cancer Sister   . Cancer Brother   . Cancer Maternal Aunt     Smoking History quit 10 years ago smoked 2 packs a day(1)  PFSH: Comments: family history of breast cancer and melanoma.  Comments: patient had smoked for 40 years one pack per day, quit smoking 10 years ago.  Does not drink  Additional Past Medical and Surgical History: coronary artery disease with stent placement.    Non-insulin-dependent diabetes.    Hypertension.    Multiple skin cancers removed including melanoma.    Aneurysm repair.  Hypercholesteremia     ADVANCED DIRECTIVES: Patient does have advanced healthcare directive   HEALTH  MAINTENANCE: Social History  Substance Use Topics  . Smoking status: Former Smoker    Types: Cigarettes, Cigars    Quit date: 04/29/2003  . Smokeless tobacco: None  . Alcohol Use: No       Allergies  Allergen Reactions  . Penicillin G Rash  . Sulfa Antibiotics Rash    Current Outpatient Prescriptions  Medication Sig Dispense Refill  . ADVAIR DISKUS 250-50 MCG/DOSE AEPB INHALE 1 PUFF TWICE A DAY 60 each 0  . benzonatate (TESSALON) 200 MG capsule Take 200 mg by mouth 3 (three) times daily as needed for cough.    . clopidogrel (PLAVIX) 75 MG tablet Take by mouth.    . fluticasone (FLONASE) 50 MCG/ACT nasal spray 1 spray. 1 spray in each nostril BID    . furosemide (LASIX) 20 MG tablet     . guaiFENesin (MUCINEX) 600 MG 12 hr tablet Take 1 tablet (600 mg total) by mouth 2 (two) times daily. 30 tablet 0  . HUMALOG KWIKPEN 100 UNIT/ML KiwkPen CHECK BLOOD SUGAR BEFORE EACH MEAL. INJECT UNDER THE SKIN PER SLIDING SCALE. MAX 30 U PER DAY  2  . Hydrocodone-Chlorpheniramine 5-4 MG/5ML SOLN Take 5 mLs by mouth 2 (two) times daily. 240 mL 0  .  ipratropium-albuterol (DUONEB) 0.5-2.5 (3) MG/3ML SOLN Take 3 mLs by nebulization every 8 (eight) hours as needed.    Marland Kitchen LANTUS SOLOSTAR 100 UNIT/ML Solostar Pen 22 Units. 22 units at bedtime    . lidocaine-prilocaine (EMLA) cream Apply to affected area once 30 g 3  . losartan (COZAAR) 50 MG tablet TAKE 1 TABLET BY MOUTH ONCE A DAY AS DIRECTED  5  . metoprolol succinate (TOPROL-XL) 100 MG 24 hr tablet Take 100 mg by mouth daily.    Marland Kitchen omeprazole (PRILOSEC) 40 MG capsule TAKE 1 CAPSULE (40 MG TOTAL) BY MOUTH DAILY. 30 capsule 0  . predniSONE (DELTASONE) 10 MG tablet 1 TAB(S) ORALLY ONCE A DAY 30 tablet 3  . PROAIR HFA 108 (90 BASE) MCG/ACT inhaler Inhale 2 puffs into the lungs 4 (four) times daily as needed.    Marland Kitchen SPIRIVA HANDIHALER 18 MCG inhalation capsule INHALE 1 CAPSULE WITH HANDIHALER ONCE A DAY 30 capsule 5  . tamsulosin (FLOMAX) 0.4 MG CAPS capsule  Take 0.4 mg by mouth daily.  7  . citalopram (CELEXA) 20 MG tablet Take 1 tablet (20 mg total) by mouth daily. 30 tablet 3   No current facility-administered medications for this visit.   Facility-Administered Medications Ordered in Other Visits  Medication Dose Route Frequency Provider Last Rate Last Dose  . sodium chloride 0.9 % injection 10 mL  10 mL Intracatheter PRN Forest Gleason, MD      . sodium chloride 0.9 % injection 10 mL  10 mL Intracatheter PRN Forest Gleason, MD   10 mL at 09/11/14 1405  . sodium chloride 0.9 % injection 10 mL  10 mL Intracatheter PRN Forest Gleason, MD   10 mL at 09/26/14 1405    OBJECTIVE:  Filed Vitals:   12/28/14 1345  BP: 110/67  Pulse: 88  Temp: 97.4 F (36.3 C)     Body mass index is 26.16 kg/(m^2).    ECOG FS:1 - Symptomatic but completely ambulatory  PHYSICAL EXAM: Goal status: Performance status is good.  Patient has not lost significant weight HEENT: No evidence of stomatitis.   Sclera and conjunctivae :: No jaundice.   pale looking . Lungs: Crepitation and right base  Dullness on percussion on the right side.  Occasional rhonchi on both sides.  Cardiac: Heart sounds are normal.  No pericardial rub.  No murmur. Lymphatic system: Cervical, axillary, inguinal, lymph nodes not palpable GI: Abdomen is soft.  No ascites.  Liver spleen not palpable.  No tenderness.  Bowel sounds are within normal limit Lower extremity: No edema Neurological system: Higher functions, cranial nerves intact No evidence of peripheral neuropathy. Skin: No rash.  No ecchymosis.. Musculoskeletal system within normal limit    LAB RESULTS:  Infusion on 12/28/2014  Component Date Value Ref Range Status  . WBC 12/28/2014 11.2* 3.8 - 10.6 K/uL Final   A-LINE DRAW  . RBC 12/28/2014 4.45  4.40 - 5.90 MIL/uL Final  . Hemoglobin 12/28/2014 12.5* 13.0 - 18.0 g/dL Final  . HCT 12/28/2014 38.9* 40.0 - 52.0 % Final  . MCV 12/28/2014 87.5  80.0 - 100.0 fL Final  . MCH  12/28/2014 28.1  26.0 - 34.0 pg Final  . MCHC 12/28/2014 32.1  32.0 - 36.0 g/dL Final  . RDW 12/28/2014 16.6* 11.5 - 14.5 % Final  . Platelets 12/28/2014 139* 150 - 440 K/uL Final  . Neutrophils Relative % 12/28/2014 91   Final  . Neutro Abs 12/28/2014 10.2* 1.4 - 6.5 K/uL Final  . Lymphocytes  Relative 12/28/2014 3   Final  . Lymphs Abs 12/28/2014 0.4* 1.0 - 3.6 K/uL Final  . Monocytes Relative 12/28/2014 4   Final  . Monocytes Absolute 12/28/2014 0.5  0.2 - 1.0 K/uL Final  . Eosinophils Relative 12/28/2014 1   Final  . Eosinophils Absolute 12/28/2014 0.1  0 - 0.7 K/uL Final  . Basophils Relative 12/28/2014 1   Final  . Basophils Absolute 12/28/2014 0.1  0 - 0.1 K/uL Final  . Sodium 12/28/2014 135  135 - 145 mmol/L Final  . Potassium 12/28/2014 4.0  3.5 - 5.1 mmol/L Final  . Chloride 12/28/2014 103  101 - 111 mmol/L Final  . CO2 12/28/2014 28  22 - 32 mmol/L Final  . Glucose, Bld 12/28/2014 278* 65 - 99 mg/dL Final  . BUN 12/28/2014 25* 6 - 20 mg/dL Final  . Creatinine, Ser 12/28/2014 1.33* 0.61 - 1.24 mg/dL Final  . Calcium 12/28/2014 8.0* 8.9 - 10.3 mg/dL Final  . Total Protein 12/28/2014 6.3* 6.5 - 8.1 g/dL Final  . Albumin 12/28/2014 3.6  3.5 - 5.0 g/dL Final  . AST 12/28/2014 18  15 - 41 U/L Final  . ALT 12/28/2014 14* 17 - 63 U/L Final  . Alkaline Phosphatase 12/28/2014 56  38 - 126 U/L Final  . Total Bilirubin 12/28/2014 0.5  0.3 - 1.2 mg/dL Final  . GFR calc non Af Amer 12/28/2014 48* >60 mL/min Final  . GFR calc Af Amer 12/28/2014 56* >60 mL/min Final   Comment: (NOTE) The eGFR has been calculated using the CKD EPI equation. This calculation has not been validated in all clinical situations. eGFR's persistently <60 mL/min signify possible Chronic Kidney Disease.   . Anion gap 12/28/2014 4* 5 - 15 Final      STUDIES: No results found.  ASSESSMENT: Recurrent squamous cell carcinoma of right lower lobe. In last 2 weeks patient's condition has somewhat declined  possibility of intercurrent infection versus progressive disease COPD Diabetes insulin-dependent Senile tremors( All the x-rays were reviewed Small nodule on the back appears to be a sebaceous cyst but will be carefully followed in view of patient's history of stage IV carcinoma of lung MEDICAL DECISION MAKING:  Thoracentesis was negative for malignant cells.  There is no clear-cut evidence of progressive tumor Will continue patient on NIVOLULAMAB The patient is somewhat more depressed.  We will increase the celexa to 20 mg daily Gradually taper off prednisone  Lung cancer   Staging form: Lung, AJCC 7th Edition     Clinical: Stage IV (T3, N2, M1b) - Signed by Curt Bears, MD on 05/25/2013     Pathologic: No stage assigned - Marni Griffon, MD   12/29/2014 5:10 PM

## 2015-01-11 ENCOUNTER — Inpatient Hospital Stay: Payer: Medicare Other

## 2015-01-11 VITALS — BP 128/74 | HR 84 | Resp 20

## 2015-01-11 DIAGNOSIS — C3490 Malignant neoplasm of unspecified part of unspecified bronchus or lung: Secondary | ICD-10-CM

## 2015-01-11 DIAGNOSIS — Z5111 Encounter for antineoplastic chemotherapy: Secondary | ICD-10-CM | POA: Diagnosis not present

## 2015-01-11 MED ORDER — SODIUM CHLORIDE 0.9 % IV SOLN
3.0000 mg/kg | Freq: Once | INTRAVENOUS | Status: AC
  Administered 2015-01-11: 260 mg via INTRAVENOUS
  Filled 2015-01-11: qty 20

## 2015-01-11 MED ORDER — HYDROCOD POLST-CPM POLST ER 10-8 MG/5ML PO SUER: 5.0000 mL | Freq: Two times a day (BID) | ORAL | Status: DC | PRN

## 2015-01-11 MED ORDER — SODIUM CHLORIDE 0.9 % IJ SOLN
10.0000 mL | INTRAMUSCULAR | Status: DC | PRN
  Administered 2015-01-11: 10 mL
  Filled 2015-01-11: qty 10

## 2015-01-11 MED ORDER — SODIUM CHLORIDE 0.9 % IV SOLN
INTRAVENOUS | Status: DC
  Administered 2015-01-11: 14:00:00 via INTRAVENOUS
  Filled 2015-01-11: qty 1000

## 2015-01-11 MED ORDER — HEPARIN SOD (PORK) LOCK FLUSH 100 UNIT/ML IV SOLN
500.0000 [IU] | Freq: Once | INTRAVENOUS | Status: AC | PRN
  Administered 2015-01-11: 500 [IU]
  Filled 2015-01-11: qty 5

## 2015-01-25 ENCOUNTER — Ambulatory Visit
Admission: RE | Admit: 2015-01-25 | Discharge: 2015-01-25 | Disposition: A | Discharge status: Home / Self Care | Payer: Medicare Other | Source: Ambulatory Visit | Attending: Oncology | Admitting: Oncology

## 2015-01-25 ENCOUNTER — Inpatient Hospital Stay: Payer: Medicare Other

## 2015-01-25 ENCOUNTER — Inpatient Hospital Stay: Payer: Medicare Other | Admitting: Oncology

## 2015-01-25 DIAGNOSIS — Z08 Encounter for follow-up examination after completed treatment for malignant neoplasm: Secondary | ICD-10-CM | POA: Insufficient documentation

## 2015-01-25 DIAGNOSIS — C3430 Malignant neoplasm of lower lobe, unspecified bronchus or lung: Secondary | ICD-10-CM

## 2015-01-25 DIAGNOSIS — J90 Pleural effusion, not elsewhere classified: Secondary | ICD-10-CM | POA: Insufficient documentation

## 2015-01-25 DIAGNOSIS — Z85118 Personal history of other malignant neoplasm of bronchus and lung: Secondary | ICD-10-CM | POA: Insufficient documentation

## 2015-01-26 ENCOUNTER — Inpatient Hospital Stay: Payer: Medicare Other

## 2015-01-26 ENCOUNTER — Encounter: Payer: Self-pay | Admitting: Oncology

## 2015-01-26 ENCOUNTER — Inpatient Hospital Stay (HOSPITAL_BASED_OUTPATIENT_CLINIC_OR_DEPARTMENT_OTHER): Payer: Medicare Other | Admitting: Family Medicine

## 2015-01-26 VITALS — BP 113/63 | HR 87 | Temp 96.4°F | Resp 18 | Wt 190.5 lb

## 2015-01-26 DIAGNOSIS — C3432 Malignant neoplasm of lower lobe, left bronchus or lung: Secondary | ICD-10-CM

## 2015-01-26 DIAGNOSIS — Z923 Personal history of irradiation: Secondary | ICD-10-CM

## 2015-01-26 DIAGNOSIS — I2510 Atherosclerotic heart disease of native coronary artery without angina pectoris: Secondary | ICD-10-CM

## 2015-01-26 DIAGNOSIS — I714 Abdominal aortic aneurysm, without rupture: Secondary | ICD-10-CM

## 2015-01-26 DIAGNOSIS — Z5111 Encounter for antineoplastic chemotherapy: Secondary | ICD-10-CM | POA: Diagnosis not present

## 2015-01-26 DIAGNOSIS — R0602 Shortness of breath: Secondary | ICD-10-CM | POA: Diagnosis not present

## 2015-01-26 DIAGNOSIS — E119 Type 2 diabetes mellitus without complications: Secondary | ICD-10-CM

## 2015-01-26 DIAGNOSIS — Z794 Long term (current) use of insulin: Secondary | ICD-10-CM

## 2015-01-26 DIAGNOSIS — I129 Hypertensive chronic kidney disease with stage 1 through stage 4 chronic kidney disease, or unspecified chronic kidney disease: Secondary | ICD-10-CM

## 2015-01-26 DIAGNOSIS — R5383 Other fatigue: Secondary | ICD-10-CM

## 2015-01-26 DIAGNOSIS — N189 Chronic kidney disease, unspecified: Secondary | ICD-10-CM

## 2015-01-26 DIAGNOSIS — Z79899 Other long term (current) drug therapy: Secondary | ICD-10-CM

## 2015-01-26 DIAGNOSIS — R05 Cough: Secondary | ICD-10-CM | POA: Diagnosis not present

## 2015-01-26 DIAGNOSIS — C3490 Malignant neoplasm of unspecified part of unspecified bronchus or lung: Secondary | ICD-10-CM

## 2015-01-26 DIAGNOSIS — J90 Pleural effusion, not elsewhere classified: Secondary | ICD-10-CM | POA: Diagnosis not present

## 2015-01-26 DIAGNOSIS — Z809 Family history of malignant neoplasm, unspecified: Secondary | ICD-10-CM

## 2015-01-26 DIAGNOSIS — I10 Essential (primary) hypertension: Secondary | ICD-10-CM

## 2015-01-26 DIAGNOSIS — R1310 Dysphagia, unspecified: Secondary | ICD-10-CM

## 2015-01-26 DIAGNOSIS — J449 Chronic obstructive pulmonary disease, unspecified: Secondary | ICD-10-CM

## 2015-01-26 DIAGNOSIS — Z85828 Personal history of other malignant neoplasm of skin: Secondary | ICD-10-CM

## 2015-01-26 DIAGNOSIS — Z7952 Long term (current) use of systemic steroids: Secondary | ICD-10-CM

## 2015-01-26 DIAGNOSIS — Z87891 Personal history of nicotine dependence: Secondary | ICD-10-CM

## 2015-01-26 DIAGNOSIS — F329 Major depressive disorder, single episode, unspecified: Secondary | ICD-10-CM

## 2015-01-26 DIAGNOSIS — K219 Gastro-esophageal reflux disease without esophagitis: Secondary | ICD-10-CM

## 2015-01-26 DIAGNOSIS — R531 Weakness: Secondary | ICD-10-CM

## 2015-01-26 DIAGNOSIS — E780 Pure hypercholesterolemia: Secondary | ICD-10-CM

## 2015-01-26 LAB — CBC WITH DIFFERENTIAL/PLATELET
BASOS ABS: 0.1 10*3/uL (ref 0–0.1)
BASOS PCT: 0 %
Eosinophils Absolute: 0.1 10*3/uL (ref 0–0.7)
Eosinophils Relative: 0 %
HEMATOCRIT: 40.1 % (ref 40.0–52.0)
HEMOGLOBIN: 12.7 g/dL — AB (ref 13.0–18.0)
LYMPHS PCT: 3 %
Lymphs Abs: 0.4 10*3/uL — ABNORMAL LOW (ref 1.0–3.6)
MCH: 28.3 pg (ref 26.0–34.0)
MCHC: 31.7 g/dL — ABNORMAL LOW (ref 32.0–36.0)
MCV: 89.2 fL (ref 80.0–100.0)
Monocytes Absolute: 0.5 10*3/uL (ref 0.2–1.0)
Monocytes Relative: 4 %
NEUTROS ABS: 12.6 10*3/uL — AB (ref 1.4–6.5)
NEUTROS PCT: 93 %
Platelets: 148 10*3/uL — ABNORMAL LOW (ref 150–440)
RBC: 4.49 MIL/uL (ref 4.40–5.90)
RDW: 16.1 % — ABNORMAL HIGH (ref 11.5–14.5)
WBC: 13.6 10*3/uL — ABNORMAL HIGH (ref 3.8–10.6)

## 2015-01-26 LAB — COMPREHENSIVE METABOLIC PANEL
ALBUMIN: 3.7 g/dL (ref 3.5–5.0)
ALK PHOS: 68 U/L (ref 38–126)
ALT: 17 U/L (ref 17–63)
AST: 15 U/L (ref 15–41)
Anion gap: 6 (ref 5–15)
BILIRUBIN TOTAL: 0.6 mg/dL (ref 0.3–1.2)
BUN: 27 mg/dL — AB (ref 6–20)
CALCIUM: 8.7 mg/dL — AB (ref 8.9–10.3)
CO2: 31 mmol/L (ref 22–32)
CREATININE: 1.67 mg/dL — AB (ref 0.61–1.24)
Chloride: 101 mmol/L (ref 101–111)
GFR calc Af Amer: 43 mL/min — ABNORMAL LOW (ref >60)
GFR, EST NON AFRICAN AMERICAN: 37 mL/min — AB (ref >60)
GLUCOSE: 268 mg/dL — AB (ref 65–99)
Potassium: 4.7 mmol/L (ref 3.5–5.1)
Sodium: 138 mmol/L (ref 135–145)
TOTAL PROTEIN: 6.6 g/dL (ref 6.5–8.1)

## 2015-01-26 MED ORDER — HEPARIN SOD (PORK) LOCK FLUSH 100 UNIT/ML IV SOLN
500.0000 [IU] | Freq: Once | INTRAVENOUS | Status: AC
  Administered 2015-01-26: 500 [IU] via INTRAVENOUS
  Filled 2015-01-26: qty 5

## 2015-01-26 MED ORDER — SODIUM CHLORIDE 0.9 % IV SOLN
3.0000 mg/kg | Freq: Once | INTRAVENOUS | Status: AC
  Administered 2015-01-26: 260 mg via INTRAVENOUS
  Filled 2015-01-26: qty 20

## 2015-01-26 MED ORDER — HEPARIN SOD (PORK) LOCK FLUSH 100 UNIT/ML IV SOLN: 500.0000 [IU] | Freq: Once | INTRAVENOUS | Status: DC | PRN

## 2015-01-26 MED ORDER — INFLUENZA VAC SPLIT QUAD 0.5 ML IM SUSY
0.5000 mL | PREFILLED_SYRINGE | Freq: Once | INTRAMUSCULAR | Status: AC
  Administered 2015-01-26: 0.5 mL via INTRAMUSCULAR
  Filled 2015-01-26: qty 0.5

## 2015-01-26 MED ORDER — SODIUM CHLORIDE 0.9 % IV SOLN
Freq: Once | INTRAVENOUS | Status: AC
  Administered 2015-01-26: 15:00:00 via INTRAVENOUS
  Filled 2015-01-26: qty 1000

## 2015-01-26 MED ORDER — SODIUM CHLORIDE 0.9 % IJ SOLN
10.0000 mL | Freq: Once | INTRAMUSCULAR | Status: DC
  Filled 2015-01-26: qty 10

## 2015-01-26 NOTE — Progress Notes (Signed)
[name]  [phone]) [phone]  Fax:(336) [phone]     [name] [name] DOB: 11/30/1930  MR#: [id number]  KHV#:[id number]  Patient Care Team: Derinda Late, MD as PCP - General (Family Medicine)  CHIEF COMPLAINT:  Chief Complaint  Patient presents with  . Cough    productive with chest congestion  . Shortness of Breath   Patient for biologic therapy for treatment of lung cancer.  INTERVAL HISTORY:  Patient is here for further evaluation and treatment consideration for stage IV squamous cell lung cancer. He reports feeling increasingly short of breath over the last several days with more productive cough. He had a chest x-ray this morning and there reveals increasing pleural effusion. He denies any fever. He reports his appetite has been fair and has been having some episodes of choking/coughing with eating. Other than shortness of breath and cough he has been his normal state of health.  REVIEW OF SYSTEMS:   Review of Systems  Constitutional: Negative for fever, chills, weight loss, malaise/fatigue and diaphoresis.       Choking when eating  HENT: Negative for congestion, ear discharge, ear pain, hearing loss, nosebleeds, sore throat and tinnitus.   Eyes: Negative for blurred vision, double vision, photophobia, pain, discharge and redness.  Respiratory: Positive for cough and shortness of breath. Negative for hemoptysis, sputum production, wheezing and stridor.   Cardiovascular: Negative for chest pain, palpitations, orthopnea, claudication, leg swelling and PND.  Gastrointestinal: Negative for heartburn, nausea, vomiting, abdominal pain, diarrhea, constipation, blood in stool and melena.  Genitourinary: Negative.   Musculoskeletal: Negative.   Skin: Negative.   Neurological: Negative for dizziness, tingling, focal weakness, seizures, weakness and headaches.  Endo/Heme/Allergies: Does not bruise/bleed easily.  Psychiatric/Behavioral: Negative for depression.  The patient is not nervous/anxious and does not have insomnia.     As per HPI. Otherwise, a complete review of systems is negatve.  ONCOLOGY HISTORY: Oncology History   The patient is an 81 year old gentleman with stage IV squamous cell carcinoma of the lung who is seen for assessment prior to Nivolumab.  1. Squamous cell carcinoma of lung. Bronchoscopies positive for right lower lobe mass. Started on radiation therapy and chemotherapy.  August, 2012 Carboplatinum (AUC of 2) and Taxol with concurrent radiation therapy 2. Finished chemo /  radiation  in sept 2012. 3. Bronchoscopy was negative for any  malignant cells (August, 2013) 4. Continuing hemoptysis.  Repeat bronchoscopy is positive for malignant cells consistent with squamous cell carcinoma. 5. Palliative radiation and chemotherapy with carboplatinum and Taxol.(October, 2013) has finished radiation therapy and Taxol, carboplatinum in November of 2013 6.veristrat test is good 7.on Tarceva 8.progressing disease on Tarceva.  (January, 2015) discontinue Tarceva. 9.started on the investigational protocol with  [facility] February, 2015 10Patient is off protocol and continuing NIVO as. commercial product 11.  Complete evaluation during July 2 016 including thoracentesis cytology was negative for malignant cells     Lung cancer   12/24/2010 Initial Diagnosis Lung cancer    PAST MEDICAL HISTORY: Past Medical History  Diagnosis Date  . Hypercholesteremia   . Hypertension   . Diabetes mellitus without complication   . AAA (abdominal aortic aneurysm)   . Lung cancer     09/2010  . Skin cancer   . CAD (coronary artery disease) stent placed  . COPD (chronic obstructive pulmonary disease)   . Shortness of breath dyspnea   . Pneumonia   . Chronic kidney disease   . GERD (gastroesophageal reflux disease)   .  Headache     PAST SURGICAL HISTORY: Past Surgical History  Procedure Laterality Date  . Abdominal aortic aneurysm  repair  2000  . Coronary stent placement  2000  . Tonsillectomy      when he was a child    FAMILY HISTORY Family History  Problem Relation Age of Onset  . Cancer Mother   . Cancer Sister   . Cancer Brother   . Cancer Maternal Aunt     GYNECOLOGIC HISTORY:  No LMP for male patient.     ADVANCED DIRECTIVES:    HEALTH MAINTENANCE: Social History  Substance Use Topics  . Smoking status: Former Smoker    Types: Cigarettes, Cigars    Quit date: 04/29/2003  . Smokeless tobacco: Not on file  . Alcohol Use: No     Colonoscopy:  PAP:  Bone density:  Lipid panel:  Allergies  Allergen Reactions  . Penicillin G Rash  . Sulfa Antibiotics Rash    Current Outpatient Prescriptions  Medication Sig Dispense Refill  . ADVAIR DISKUS 250-50 MCG/DOSE AEPB INHALE 1 PUFF TWICE A DAY 60 each 0  . benzonatate (TESSALON) 200 MG capsule Take 200 mg by mouth 3 (three) times daily as needed for cough.    . chlorpheniramine-HYDROcodone (TUSSIONEX PENNKINETIC ER) 10-8 MG/5ML SUER Take 5 mLs by mouth every 12 (twelve) hours as needed for cough. 240 mL 0  . citalopram (CELEXA) 20 MG tablet Take 1 tablet (20 mg total) by mouth daily. 30 tablet 3  . clopidogrel (PLAVIX) 75 MG tablet Take by mouth.    . fluticasone (FLONASE) 50 MCG/ACT nasal spray 1 spray. 1 spray in each nostril BID    . furosemide (LASIX) 20 MG tablet Take 1 tablet (20 mg total) by mouth every other day. 30 tablet   . HUMALOG KWIKPEN 100 UNIT/ML KiwkPen CHECK BLOOD SUGAR BEFORE EACH MEAL. INJECT UNDER THE SKIN PER SLIDING SCALE. MAX 30 U PER DAY  2  . ipratropium-albuterol (DUONEB) 0.5-2.5 (3) MG/3ML SOLN Take 3 mLs by nebulization every 8 (eight) hours as needed.    Marland Kitchen LANTUS SOLOSTAR 100 UNIT/ML Solostar Pen 22 Units. 22 units at bedtime    . lidocaine-prilocaine (EMLA) cream Apply to affected area once 30 g 3  . losartan (COZAAR) 50 MG tablet TAKE 1 TABLET BY MOUTH ONCE A DAY AS DIRECTED  5  . metoprolol succinate (TOPROL-XL)  100 MG 24 hr tablet Take 100 mg by mouth daily.    Marland Kitchen omeprazole (PRILOSEC) 40 MG capsule TAKE 1 CAPSULE (40 MG TOTAL) BY MOUTH DAILY. 30 capsule 0  . predniSONE (DELTASONE) 10 MG tablet 1 TAB(S) ORALLY ONCE A DAY 30 tablet 3  . PROAIR HFA 108 (90 BASE) MCG/ACT inhaler Inhale 2 puffs into the lungs 4 (four) times daily as needed.    Marland Kitchen SPIRIVA HANDIHALER 18 MCG inhalation capsule INHALE 1 CAPSULE WITH HANDIHALER ONCE A DAY 30 capsule 5  . tamsulosin (FLOMAX) 0.4 MG CAPS capsule Take 0.4 mg by mouth daily.  7   No current facility-administered medications for this visit.   Facility-Administered Medications Ordered in Other Visits  Medication Dose Route Frequency Provider Last Rate Last Dose  . heparin lock flush 100 unit/mL  500 Units Intravenous Once Forest Gleason, MD      . sodium chloride 0.9 % injection 10 mL  10 mL Intracatheter PRN Forest Gleason, MD      . sodium chloride 0.9 % injection 10 mL  10 mL Intracatheter PRN Forest Gleason, MD  10 mL at 09/11/14 1405  . sodium chloride 0.9 % injection 10 mL  10 mL Intracatheter PRN Forest Gleason, MD   10 mL at 09/26/14 1405  . sodium chloride 0.9 % injection 10 mL  10 mL Intravenous Once Forest Gleason, MD        OBJECTIVE: BP 113/63 mmHg  Pulse 87  Temp(Src) 96.4 F (35.8 C) (Tympanic)  Resp 18  Wt 190 lb 7.6 oz (86.4 kg)  SpO2 92%   Body mass index is 26.58 kg/(m^2).    ECOG FS:1 - Symptomatic but completely ambulatory  General: Well-developed, well-nourished, no acute distress. Eyes: Pink conjunctiva, anicteric sclera. HEENT: Normocephalic, moist mucous membranes, clear oropharnyx. Lungs: Left lung sounds diminished. Right middle and lower lobe diminished with rhonchi. Heart: Regular rate and rhythm. No rubs, murmurs, or gallops. Abdomen: Soft, nontender, nondistended. No organomegaly noted, normoactive bowel sounds. Musculoskeletal: No edema, cyanosis, or clubbing. Neuro: Alert, answering all questions appropriately. Cranial nerves  grossly intact. Skin: No rashes or petechiae noted. Psych: Normal affect.    LAB RESULTS:  Infusion on 01/26/2015  Component Date Value Ref Range Status  . WBC 01/26/2015 13.6* 3.8 - 10.6 K/uL Final  . RBC 01/26/2015 4.49  4.40 - 5.90 MIL/uL Final  . Hemoglobin 01/26/2015 12.7* 13.0 - 18.0 g/dL Final  . HCT 01/26/2015 40.1  40.0 - 52.0 % Final  . MCV 01/26/2015 89.2  80.0 - 100.0 fL Final  . MCH 01/26/2015 28.3  26.0 - 34.0 pg Final  . MCHC 01/26/2015 31.7* 32.0 - 36.0 g/dL Final  . RDW 01/26/2015 16.1* 11.5 - 14.5 % Final  . Platelets 01/26/2015 148* 150 - 440 K/uL Final  . Neutrophils Relative % 01/26/2015 93   Final  . Neutro Abs 01/26/2015 12.6* 1.4 - 6.5 K/uL Final  . Lymphocytes Relative 01/26/2015 3   Final  . Lymphs Abs 01/26/2015 0.4* 1.0 - 3.6 K/uL Final  . Monocytes Relative 01/26/2015 4   Final  . Monocytes Absolute 01/26/2015 0.5  0.2 - 1.0 K/uL Final  . Eosinophils Relative 01/26/2015 0   Final  . Eosinophils Absolute 01/26/2015 0.1  0 - 0.7 K/uL Final  . Basophils Relative 01/26/2015 0   Final  . Basophils Absolute 01/26/2015 0.1  0 - 0.1 K/uL Final  . Sodium 01/26/2015 138  135 - 145 mmol/L Final  . Potassium 01/26/2015 4.7  3.5 - 5.1 mmol/L Final  . Chloride 01/26/2015 101  101 - 111 mmol/L Final  . CO2 01/26/2015 31  22 - 32 mmol/L Final  . Glucose, Bld 01/26/2015 268* 65 - 99 mg/dL Final  . BUN 01/26/2015 27* 6 - 20 mg/dL Final  . Creatinine, Ser 01/26/2015 1.67* 0.61 - 1.24 mg/dL Final  . Calcium 01/26/2015 8.7* 8.9 - 10.3 mg/dL Final  . Total Protein 01/26/2015 6.6  6.5 - 8.1 g/dL Final  . Albumin 01/26/2015 3.7  3.5 - 5.0 g/dL Final  . AST 01/26/2015 15  15 - 41 U/L Final  . ALT 01/26/2015 17  17 - 63 U/L Final  . Alkaline Phosphatase 01/26/2015 68  38 - 126 U/L Final  . Total Bilirubin 01/26/2015 0.6  0.3 - 1.2 mg/dL Final  . GFR calc non Af Amer 01/26/2015 37* >60 mL/min Final  . GFR calc Af Amer 01/26/2015 43* >60 mL/min Final   Comment:  (NOTE) The eGFR has been calculated using the CKD EPI equation. This calculation has not been validated in all clinical situations. eGFR's persistently <60 mL/min signify possible  Chronic Kidney Disease.   . Anion gap 01/26/2015 6  5 - 15 Final    STUDIES: Dg Chest 2 View  01/25/2015   CLINICAL DATA:  Lung cancer follow-up, chronic shortness-of-breath.  EXAM: CHEST  2 VIEW  COMPARISON:  11/07/2014  FINDINGS: Right IJ Port-A-Cath unchanged. Postsurgical volume loss right lung with slight worsening moderate to large right effusion. Left lung is clear. Remainder the exam is unchanged.  IMPRESSION: Postsurgical volume loss of the right lung with slight worsening moderate to large right effusion.   Electronically Signed   By: Marin Olp M.D.   On: 01/25/2015 12:41    ASSESSMENT:  Stage IV squamous cell carcinoma of lung.  PLAN:   1. Lung CA. Overall patient's condition has somewhat declined over the last several days with increasing cough, shortness of breath, as well as some choking with eating. Chest x-ray was reviewed today and reported as having increasing pleural effusion and right lower lobe. We'll send patient for a therapeutic thoracentesis. We will go ahead with Nivolumab today. Also advised patient on methods to decrease incidence of choking when eating. Patient advised to contact us if checking worsens or if fever is present.  We will have patient follow-up in 2 weeks with Dr. Oliva Bustard.  Patient expressed understanding and was in agreement with this plan. He also understands that He can call clinic at any time with any questions, concerns, or complaints.   Dr. Oliva Bustard was available for consultation and review of plan of care for this patient.  Lung cancer   Staging form: Lung, AJCC 7th Edition     Clinical: Stage IV (T3, N2, M1b) - Signed by Curt Bears, MD on 05/25/2013     Pathologic: No stage assigned - Unsigned   Evlyn Kanner, NP   01/26/2015 2:38 PM

## 2015-01-29 ENCOUNTER — Ambulatory Visit
Admission: RE | Admit: 2015-01-29 | Discharge: 2015-01-29 | Disposition: A | Discharge status: Home / Self Care | Payer: Medicare Other | Source: Ambulatory Visit | Attending: Family Medicine | Admitting: Family Medicine

## 2015-01-29 DIAGNOSIS — J90 Pleural effusion, not elsewhere classified: Secondary | ICD-10-CM | POA: Diagnosis present

## 2015-01-29 DIAGNOSIS — C3490 Malignant neoplasm of unspecified part of unspecified bronchus or lung: Secondary | ICD-10-CM

## 2015-01-29 DIAGNOSIS — Z9889 Other specified postprocedural states: Secondary | ICD-10-CM

## 2015-01-29 NOTE — Procedures (Signed)
US thoracentesis on right  Complications:  None  Blood Loss: none  See dictation in canopy pacs

## 2015-01-30 ENCOUNTER — Other Ambulatory Visit: Payer: Self-pay | Admitting: Family Medicine

## 2015-01-30 DIAGNOSIS — C3431 Malignant neoplasm of lower lobe, right bronchus or lung: Secondary | ICD-10-CM

## 2015-02-08 ENCOUNTER — Ambulatory Visit
Admission: RE | Admit: 2015-02-08 | Discharge: 2015-02-08 | Disposition: A | Discharge status: Home / Self Care | Payer: Medicare Other | Source: Ambulatory Visit | Attending: Oncology | Admitting: Oncology

## 2015-02-08 ENCOUNTER — Inpatient Hospital Stay (HOSPITAL_BASED_OUTPATIENT_CLINIC_OR_DEPARTMENT_OTHER): Payer: Medicare Other | Admitting: Oncology

## 2015-02-08 ENCOUNTER — Ambulatory Visit: Payer: Medicare Other

## 2015-02-08 ENCOUNTER — Other Ambulatory Visit: Payer: Medicare Other

## 2015-02-08 ENCOUNTER — Inpatient Hospital Stay: Payer: Medicare Other

## 2015-02-08 ENCOUNTER — Ambulatory Visit
Admission: RE | Admit: 2015-02-08 | Discharge: 2015-02-08 | Disposition: A | Discharge status: Home / Self Care | Payer: Medicare Other | Source: Ambulatory Visit | Attending: Family Medicine | Admitting: Family Medicine

## 2015-02-08 ENCOUNTER — Inpatient Hospital Stay: Payer: Medicare Other | Attending: Oncology

## 2015-02-08 VITALS — BP 115/67 | HR 87 | Temp 97.2°F | Wt 191.6 lb

## 2015-02-08 DIAGNOSIS — E7800 Pure hypercholesterolemia, unspecified: Secondary | ICD-10-CM | POA: Insufficient documentation

## 2015-02-08 DIAGNOSIS — I129 Hypertensive chronic kidney disease with stage 1 through stage 4 chronic kidney disease, or unspecified chronic kidney disease: Secondary | ICD-10-CM

## 2015-02-08 DIAGNOSIS — Z87891 Personal history of nicotine dependence: Secondary | ICD-10-CM | POA: Diagnosis not present

## 2015-02-08 DIAGNOSIS — Z809 Family history of malignant neoplasm, unspecified: Secondary | ICD-10-CM

## 2015-02-08 DIAGNOSIS — C3432 Malignant neoplasm of lower lobe, left bronchus or lung: Secondary | ICD-10-CM

## 2015-02-08 DIAGNOSIS — J449 Chronic obstructive pulmonary disease, unspecified: Secondary | ICD-10-CM | POA: Insufficient documentation

## 2015-02-08 DIAGNOSIS — C3490 Malignant neoplasm of unspecified part of unspecified bronchus or lung: Secondary | ICD-10-CM

## 2015-02-08 DIAGNOSIS — C7951 Secondary malignant neoplasm of bone: Secondary | ICD-10-CM | POA: Diagnosis not present

## 2015-02-08 DIAGNOSIS — Z8701 Personal history of pneumonia (recurrent): Secondary | ICD-10-CM

## 2015-02-08 DIAGNOSIS — R0602 Shortness of breath: Secondary | ICD-10-CM | POA: Diagnosis not present

## 2015-02-08 DIAGNOSIS — N189 Chronic kidney disease, unspecified: Secondary | ICD-10-CM | POA: Diagnosis not present

## 2015-02-08 DIAGNOSIS — K219 Gastro-esophageal reflux disease without esophagitis: Secondary | ICD-10-CM | POA: Insufficient documentation

## 2015-02-08 DIAGNOSIS — R531 Weakness: Secondary | ICD-10-CM

## 2015-02-08 DIAGNOSIS — I2510 Atherosclerotic heart disease of native coronary artery without angina pectoris: Secondary | ICD-10-CM

## 2015-02-08 DIAGNOSIS — Z85828 Personal history of other malignant neoplasm of skin: Secondary | ICD-10-CM | POA: Diagnosis not present

## 2015-02-08 DIAGNOSIS — C3431 Malignant neoplasm of lower lobe, right bronchus or lung: Secondary | ICD-10-CM | POA: Insufficient documentation

## 2015-02-08 DIAGNOSIS — Z794 Long term (current) use of insulin: Secondary | ICD-10-CM | POA: Diagnosis not present

## 2015-02-08 DIAGNOSIS — E119 Type 2 diabetes mellitus without complications: Secondary | ICD-10-CM

## 2015-02-08 DIAGNOSIS — Z923 Personal history of irradiation: Secondary | ICD-10-CM | POA: Diagnosis not present

## 2015-02-08 DIAGNOSIS — R630 Anorexia: Secondary | ICD-10-CM | POA: Insufficient documentation

## 2015-02-08 DIAGNOSIS — C787 Secondary malignant neoplasm of liver and intrahepatic bile duct: Secondary | ICD-10-CM | POA: Diagnosis not present

## 2015-02-08 DIAGNOSIS — I714 Abdominal aortic aneurysm, without rupture: Secondary | ICD-10-CM | POA: Insufficient documentation

## 2015-02-08 DIAGNOSIS — J939 Pneumothorax, unspecified: Secondary | ICD-10-CM | POA: Insufficient documentation

## 2015-02-08 DIAGNOSIS — R509 Fever, unspecified: Secondary | ICD-10-CM | POA: Diagnosis not present

## 2015-02-08 DIAGNOSIS — C801 Malignant (primary) neoplasm, unspecified: Secondary | ICD-10-CM

## 2015-02-08 DIAGNOSIS — Z79899 Other long term (current) drug therapy: Secondary | ICD-10-CM | POA: Insufficient documentation

## 2015-02-08 DIAGNOSIS — Z9221 Personal history of antineoplastic chemotherapy: Secondary | ICD-10-CM

## 2015-02-08 LAB — COMPREHENSIVE METABOLIC PANEL
ALBUMIN: 3.6 g/dL (ref 3.5–5.0)
ALK PHOS: 58 U/L (ref 38–126)
ALT: 14 U/L — AB (ref 17–63)
AST: 16 U/L (ref 15–41)
Anion gap: 8 (ref 5–15)
BUN: 23 mg/dL — ABNORMAL HIGH (ref 6–20)
CALCIUM: 8.3 mg/dL — AB (ref 8.9–10.3)
CHLORIDE: 101 mmol/L (ref 101–111)
CO2: 31 mmol/L (ref 22–32)
CREATININE: 1.5 mg/dL — AB (ref 0.61–1.24)
GFR calc non Af Amer: 42 mL/min — ABNORMAL LOW (ref >60)
GFR, EST AFRICAN AMERICAN: 49 mL/min — AB (ref >60)
GLUCOSE: 200 mg/dL — AB (ref 65–99)
Potassium: 3.5 mmol/L (ref 3.5–5.1)
SODIUM: 140 mmol/L (ref 135–145)
Total Bilirubin: 0.5 mg/dL (ref 0.3–1.2)
Total Protein: 6.1 g/dL — ABNORMAL LOW (ref 6.5–8.1)

## 2015-02-08 LAB — CBC WITH DIFFERENTIAL/PLATELET
BASOS ABS: 0.1 10*3/uL (ref 0–0.1)
BASOS PCT: 1 %
EOS ABS: 0.2 10*3/uL (ref 0–0.7)
EOS PCT: 2 %
HCT: 38.6 % — ABNORMAL LOW (ref 40.0–52.0)
HEMOGLOBIN: 12.4 g/dL — AB (ref 13.0–18.0)
LYMPHS ABS: 0.5 10*3/uL — AB (ref 1.0–3.6)
Lymphocytes Relative: 6 %
MCH: 28.5 pg (ref 26.0–34.0)
MCHC: 32.1 g/dL (ref 32.0–36.0)
MCV: 88.7 fL (ref 80.0–100.0)
Monocytes Absolute: 0.7 10*3/uL (ref 0.2–1.0)
Monocytes Relative: 8 %
NEUTROS PCT: 83 %
Neutro Abs: 7.4 10*3/uL — ABNORMAL HIGH (ref 1.4–6.5)
PLATELETS: 160 10*3/uL (ref 150–440)
RBC: 4.35 MIL/uL — AB (ref 4.40–5.90)
RDW: 15.6 % — ABNORMAL HIGH (ref 11.5–14.5)
WBC: 8.8 10*3/uL (ref 3.8–10.6)

## 2015-02-08 MED ORDER — SODIUM CHLORIDE 0.9 % IJ SOLN
10.0000 mL | INTRAMUSCULAR | Status: AC | PRN
  Administered 2015-02-08: 10 mL
  Filled 2015-02-08: qty 10

## 2015-02-08 MED ORDER — HYDROCOD POLST-CPM POLST ER 10-8 MG/5ML PO SUER: 5.0000 mL | Freq: Two times a day (BID) | ORAL | Status: DC | PRN

## 2015-02-08 MED ORDER — HEPARIN SOD (PORK) LOCK FLUSH 100 UNIT/ML IV SOLN
500.0000 [IU] | Freq: Once | INTRAVENOUS | Status: AC
  Administered 2015-02-08: 500 [IU] via INTRAVENOUS
  Filled 2015-02-08: qty 5

## 2015-02-08 MED ORDER — PREDNISONE 10 MG PO TABS: ORAL_TABLET | ORAL | Status: DC

## 2015-02-08 MED ORDER — LEVOFLOXACIN 500 MG PO TABS: 500.0000 mg | ORAL_TABLET | Freq: Every day | ORAL | Status: DC

## 2015-02-08 NOTE — Progress Notes (Signed)
Patient states he is having breathing difficulty today.  O2 95%.  Placed yellow bracelet on patient and educated him to wear it back to each appointment  Even lab only appointments.  Patient here for results today from thoracentesis and X-ray.

## 2015-02-09 ENCOUNTER — Encounter: Payer: Self-pay | Admitting: Oncology

## 2015-02-09 NOTE — Progress Notes (Signed)
[place] @ [facility] Telephone:([phone]) [phone]  Fax:([phone]) [place]: 11-18-1930  MR#: [id number]  KYH#:[id number]  Patient Care Team: Derinda Late, MD as PCP - General (Family Medicine)  CHIEF COMPLAINT:  Chief Complaint  Patient presents with  . Results    Oncology History   The patient is an 81 year old gentleman with stage IV squamous cell carcinoma of the lung who is seen for assessment prior to Nivolumab.  1. Squamous cell carcinoma of lung. Bronchoscopies positive for right lower lobe mass. Started on radiation therapy and chemotherapy.  August, 2012 Carboplatinum (AUC of 2) and Taxol with concurrent radiation therapy 2. Finished chemo /  radiation  in sept 2012. 3. Bronchoscopy was negative for any  malignant cells (August, 2013) 4. Continuing hemoptysis.  Repeat bronchoscopy is positive for malignant cells consistent with squamous cell carcinoma. 5. Palliative radiation and chemotherapy with carboplatinum and Taxol.(October, 2013) has finished radiation therapy and Taxol, carboplatinum in November of 2013 6.veristrat test is good 7.on Tarceva 8.progressing disease on Tarceva.  (January, 2015) discontinue Tarceva. 9.started on the investigational protocol with  [facility] February, 2015 10Patient is off protocol and continuing NIVO as. commercial product 11.  Complete evaluation during July 2 016 including thoracentesis cytology was negative for malignant cells     Lung cancer ([place])   12/24/2010 Initial Diagnosis Lung cancer    Oncology Flowsheet 10/09/2014 11/16/2014 11/30/2014 12/14/2014 12/28/2014 01/11/2015 01/26/2015  INV-nivolumab (BMS [id number]) IV - - - - - - -  nivolumab (OPDIVO) IV 3 mg/kg 3 mg/kg 3 mg/kg 3 mg/kg 3 mg/kg 3 mg/kg 3 mg/kg   changes.  Was also reviewed with the patient and compared with the previous  INTERVAL HISTORY: 81 year old gentleman with history of recurrent carcinoma of lung\this and has increasing shortness of breath  increasing pain.  Had a chest x-ray.  In the interim.  Had thoracentesis only 300 cc of fluid was removed.  Poor appetite. Cough yellowish expectoration Poor appetite and declining performance status Had a recent thoracentesis is 300 cc of fluid was removed there was no improvement in patient's shortness of breath  REVIEW OF SYSTEMS:   Gen. status: Feeling weak and tired low-grade fever cardiac: No chest pain no palpitation HEENT: No soreness in the marked no difficulty swallowing.  GI: Nausea no vomiting no diarrhea skin: No rash. Lungs: Increasing cough.  Shortness of breath.  Low-grade fever.  Cough with expectoration. Abdomen: No diarrhea. Musculoskeletal system no bony pains. Diabetes is under well control all the blood sugar values have been reviewed.  Lower extremity no swelling.  Patient does have senile tremors. Appetite is poor Neurological system no headache or dizziness no other local symptoms.  All other systems have been reviewed  Patient is somewhat more depressed. HEENT: No soreness in the mouth or difficulty swallowing As per HPI. Otherwise, a complete review of systems is negatve.  PAST MEDICAL HISTORY: Past Medical History  Diagnosis Date  . Hypercholesteremia   . Hypertension   . Diabetes mellitus without complication ([place])   . AAA (abdominal aortic aneurysm) ([place])   . Lung cancer ([place])     09/2010  . Skin cancer   . CAD (coronary artery disease) stent placed  . COPD (chronic obstructive pulmonary disease) ([place])   . Shortness of breath dyspnea   . Pneumonia   . Chronic kidney disease   . GERD (gastroesophageal reflux disease)   . Headache     PAST SURGICAL HISTORY: Past Surgical History  Procedure Laterality Date  . Abdominal aortic aneurysm repair  2000  . Coronary stent placement  2000  . Tonsillectomy      when he was a child    FAMILY HISTORY Family History  Problem Relation Age of Onset  . Cancer Mother   . Cancer Sister   . Cancer Brother     . Cancer Maternal Aunt     Smoking History quit 10 years ago smoked 2 packs a day(1)  PFSH: Comments: family history of breast cancer and melanoma.  Comments: patient had smoked for 40 years one pack per day, quit smoking 10 years ago.  Does not drink  Additional Past Medical and Surgical History: coronary artery disease with stent placement.    Non-insulin-dependent diabetes.    Hypertension.    Multiple skin cancers removed including melanoma.    Aneurysm repair.  Hypercholesteremia     ADVANCED DIRECTIVES: Patient does have advanced healthcare directive   HEALTH MAINTENANCE: Social History  Substance Use Topics  . Smoking status: Former Smoker    Types: Cigarettes, Cigars    Quit date: 04/29/2003  . Smokeless tobacco: None  . Alcohol Use: No       Allergies  Allergen Reactions  . Penicillin G Rash  . Sulfa Antibiotics Rash    Current Outpatient Prescriptions  Medication Sig Dispense Refill  . ADVAIR DISKUS 250-50 MCG/DOSE AEPB INHALE 1 PUFF TWICE A DAY 60 each 0  . benzonatate (TESSALON) 200 MG capsule Take 200 mg by mouth 3 (three) times daily as needed for cough.    . chlorpheniramine-HYDROcodone (TUSSIONEX PENNKINETIC ER) 10-8 MG/5ML SUER Take 5 mLs by mouth every 12 (twelve) hours as needed for cough. 240 mL 0  . citalopram (CELEXA) 20 MG tablet Take 1 tablet (20 mg total) by mouth daily. 30 tablet 3  . clopidogrel (PLAVIX) 75 MG tablet Take by mouth.    . fluticasone (FLONASE) 50 MCG/ACT nasal spray 1 spray. 1 spray in each nostril BID    . furosemide (LASIX) 20 MG tablet Take 1 tablet (20 mg total) by mouth every other day. 30 tablet   . HUMALOG KWIKPEN 100 UNIT/ML KiwkPen CHECK BLOOD SUGAR BEFORE EACH MEAL. INJECT UNDER THE SKIN PER SLIDING SCALE. MAX 30 U PER DAY  2  . ipratropium-albuterol (DUONEB) 0.5-2.5 (3) MG/3ML SOLN Take 3 mLs by nebulization every 8 (eight) hours as needed.    Marland Kitchen LANTUS SOLOSTAR 100 UNIT/ML Solostar Pen 22 Units. 22 units at bedtime     . lidocaine-prilocaine (EMLA) cream Apply to affected area once 30 g 3  . losartan (COZAAR) 50 MG tablet TAKE 1 TABLET BY MOUTH ONCE A DAY AS DIRECTED  5  . metoprolol succinate (TOPROL-XL) 100 MG 24 hr tablet Take 100 mg by mouth daily.    Marland Kitchen omeprazole (PRILOSEC) 40 MG capsule TAKE 1 CAPSULE (40 MG TOTAL) BY MOUTH DAILY. 30 capsule 0  . predniSONE (DELTASONE) 10 MG tablet 1 TAB(S) ORALLY ONCE A DAY 30 tablet 3  . PROAIR HFA 108 (90 BASE) MCG/ACT inhaler Inhale 2 puffs into the lungs 4 (four) times daily as needed.    Marland Kitchen SPIRIVA HANDIHALER 18 MCG inhalation capsule INHALE 1 CAPSULE WITH HANDIHALER ONCE A DAY 30 capsule 5  . tamsulosin (FLOMAX) 0.4 MG CAPS capsule Take 0.4 mg by mouth daily.  7  . levofloxacin (LEVAQUIN) 500 MG tablet Take 1 tablet (500 mg total) by mouth daily. 7 tablet 0  . predniSONE (DELTASONE) 10 MG tablet Take 6  tabs po x 1 day, take 5 tabs po x 1 day, take 4 tabs po x 1 day, take 3 tabs po x 1 day, take 2 tabs po x 1 day, take 1 tab po x 1 day then continue 88m daily. 21 tablet 0   No current facility-administered medications for this visit.   Facility-Administered Medications Ordered in Other Visits  Medication Dose Route Frequency Provider Last Rate Last Dose  . sodium chloride 0.9 % injection 10 mL  10 mL Intracatheter PRN JForest Gleason MD      . sodium chloride 0.9 % injection 10 mL  10 mL Intracatheter PRN JForest Gleason MD   10 mL at 09/11/14 1405  . sodium chloride 0.9 % injection 10 mL  10 mL Intracatheter PRN JForest Gleason MD   10 mL at 09/26/14 1405  . sodium chloride 0.9 % injection 10 mL  10 mL Intracatheter PRN JForest Gleason MD   10 mL at 02/08/15 1419    OBJECTIVE:  Filed Vitals:   02/08/15 1429  BP: 115/67  Pulse: 87  Temp: 97.2 F (36.2 C)     Body mass index is 27.49 kg/(m^2).    ECOG FS:1 - Symptomatic but completely ambulatory  PHYSICAL EXAM: Goal status: Performance status is climbing.  Patient has not lost significant weight HEENT: No  evidence of stomatitis.   Sclera and conjunctivae :: No jaundice.   pale looking . Lungs: Crepitation and right base  Dullness on percussion on the right side.  Occasional rhonchi on both sides.  Cardiac: Heart sounds are normal.  No pericardial rub.  No murmur. Lymphatic system: Cervical, axillary, inguinal, lymph nodes not palpable GI: Abdomen is soft.  No ascites.  Liver spleen not palpable.  No tenderness.  Bowel sounds are within normal limit Lower extremity: No edema Neurological system: Higher functions, cranial nerves intact No evidence of peripheral neuropathy. Skin: No rash.  No ecchymosis.. Musculoskeletal system within normal limit    LAB RESULTS:  Clinical Support on 02/08/2015  Component Date Value Ref Range Status  . WBC 02/08/2015 8.8  3.8 - 10.6 K/uL Final  . RBC 02/08/2015 4.35* 4.40 - 5.90 MIL/uL Final  . Hemoglobin 02/08/2015 12.4* 13.0 - 18.0 g/dL Final  . HCT 02/08/2015 38.6* 40.0 - 52.0 % Final  . MCV 02/08/2015 88.7  80.0 - 100.0 fL Final  . MCH 02/08/2015 28.5  26.0 - 34.0 pg Final  . MCHC 02/08/2015 32.1  32.0 - 36.0 g/dL Final  . RDW 02/08/2015 15.6* 11.5 - 14.5 % Final  . Platelets 02/08/2015 160  150 - 440 K/uL Final  . Neutrophils Relative % 02/08/2015 83   Final  . Neutro Abs 02/08/2015 7.4* 1.4 - 6.5 K/uL Final  . Lymphocytes Relative 02/08/2015 6   Final  . Lymphs Abs 02/08/2015 0.5* 1.0 - 3.6 K/uL Final  . Monocytes Relative 02/08/2015 8   Final  . Monocytes Absolute 02/08/2015 0.7  0.2 - 1.0 K/uL Final  . Eosinophils Relative 02/08/2015 2   Final  . Eosinophils Absolute 02/08/2015 0.2  0 - 0.7 K/uL Final  . Basophils Relative 02/08/2015 1   Final  . Basophils Absolute 02/08/2015 0.1  0 - 0.1 K/uL Final  . Sodium 02/08/2015 140  135 - 145 mmol/L Final  . Potassium 02/08/2015 3.5  3.5 - 5.1 mmol/L Final  . Chloride 02/08/2015 101  101 - 111 mmol/L Final  . CO2 02/08/2015 31  22 - 32 mmol/L Final  . Glucose, Bld 02/08/2015  200* 65 - 99 mg/dL  Final  . BUN 02/08/2015 23* 6 - 20 mg/dL Final  . Creatinine, Ser 02/08/2015 1.50* 0.61 - 1.24 mg/dL Final  . Calcium 02/08/2015 8.3* 8.9 - 10.3 mg/dL Final  . Total Protein 02/08/2015 6.1* 6.5 - 8.1 g/dL Final  . Albumin 02/08/2015 3.6  3.5 - 5.0 g/dL Final  . AST 02/08/2015 16  15 - 41 U/L Final  . ALT 02/08/2015 14* 17 - 63 U/L Final  . Alkaline Phosphatase 02/08/2015 58  38 - 126 U/L Final  . Total Bilirubin 02/08/2015 0.5  0.3 - 1.2 mg/dL Final  . GFR calc non Af Amer 02/08/2015 42* >60 mL/min Final  . GFR calc Af Amer 02/08/2015 49* >60 mL/min Final   Comment: (NOTE) The eGFR has been calculated using the CKD EPI equation. This calculation has not been validated in all clinical situations. eGFR's persistently <60 mL/min signify possible Chronic Kidney Disease.   . Anion gap 02/08/2015 8  5 - 15 Final      STUDIES: Dg Chest 2 View  02/08/2015  CLINICAL DATA:  Known malignant CT of the lower lobe of the right lung, now cough, wheezing, shortness of breath, history of COPD. EXAM: CHEST  2 VIEW COMPARISON:  Portable chest x-ray of January 29, 2015 FINDINGS: There remains volume loss on the right with with incomplete re-expansion of the lung. Slightly more fluid is present today. There is stable mild shift of the mediastinum toward the right. The left lung is well-expanded. The interstitial markings remain coarse but have slightly improved. The left heart border is normal. There is mild enlargement of the cardiac silhouette without pulmonary vascular congestion. The Port-A-Cath appliance tip projects over the midportion of the SVC. IMPRESSION: 1. Persistent persistent incompletely incomplete re-expansion of the right lung with multiple areas of loculated air and fluid. A small amount of air in the pleural space representing a tiny volume pneumothorax is present and stable. There is stable soft tissue density capping of the right pulmonary apex. Slightly more right pleural fluid present  today than on the previous study. 2. Slight interval improvement in the appearance of the pulmonary interstitium on the left. Electronically Signed   By: David  Martinique M.D.   On: 02/08/2015 14:26   Dg Chest 2 View  01/29/2015  CLINICAL DATA:  Recurrent right-sided pleural effusion, status post right thoracentesis EXAM: CHEST - 2 VIEW COMPARISON:  01/25/2015 FINDINGS: Cardiac shadow is enlarged but stable. Left lung remains clear. A right chest wall port is again seen. There is been some slight reduction in the right-sided pleural effusion with incomplete re-expansion of the right lung secondary to the patient's known underlying neoplasm. Only a small amount of fluid was withdrawn and the degree of opacification is related to the consolidated lung as opposed to a large pleural effusion. IMPRESSION: Status post right thoracentesis with incomplete re-expansion of the right lung secondary to the underlying neoplasm. The majority of the density seen on recent chest x-rays is related to consolidated lung as opposed to true pleural effusion. There is also likely a degree of multi loculation in the fluid as only a small amount of fluid was withdrawn. Electronically Signed   By: Inez Catalina M.D.   On: 01/29/2015 12:04   Dg Chest 2 View  01/25/2015  CLINICAL DATA:  Lung cancer follow-up, chronic shortness-of-breath. EXAM: CHEST  2 VIEW COMPARISON:  11/07/2014 FINDINGS: Right IJ Port-A-Cath unchanged. Postsurgical volume loss right lung with slight worsening moderate to large  right effusion. Left lung is clear. Remainder the exam is unchanged. IMPRESSION: Postsurgical volume loss of the right lung with slight worsening moderate to large right effusion. Electronically Signed   By: Marin Olp M.D.   On: 01/25/2015 12:41   US Thoracentesis Asp Pleural Space W/img Guide  01/29/2015  CLINICAL DATA:  Carcinoma of the lung with recurrent right-sided effusion EXAM: ULTRASOUND GUIDED right THORACENTESIS PROCEDURE: An  ultrasound guided thoracentesis was thoroughly discussed with the patient and questions answered. The benefits, risks, alternatives and complications were also discussed. The patient understands and wishes to proceed with the procedure. Written consent was obtained. Ultrasound was performed to localize and mark an adequate pocket of fluid in the right chest. The area was then prepped and draped in the normal sterile fashion. 1% Lidocaine was used for local anesthesia. Under ultrasound guidance a 6 French thoracentesis catheter was introduced. Thoracentesis was performed. The catheter was removed and a dressing applied. COMPLICATIONS: None immediate FINDINGS: A total of approximately 300 mL of clear yellow fluid was removed. A fluid sample was not sent for laboratory analysis. IMPRESSION: Successful ultrasound guided right thoracentesis yielding 300 mL of pleural fluid. The majority of the density seen on recent chest x-ray is related to consolidated lung as opposed to true pleural effusion. Electronically Signed   By: Inez Catalina M.D.   On: 01/29/2015 12:06    ASSESSMENT: Recurrent squamous cell carcinoma of right lower lobe. Patient recently had thoracentesis only 300 cc of fluid was removed there was no improvement in shortness of breath Repeat chest x-ray shows persistent right lower lobe consolidation progressing mass Possibility of PET scan Hold NIVOLULAMAB probably progressing disease on NIVOLULAMAB Start patient on prednisone tapering doses with Levaquin Consider PDL for [place]:  Repeat PET scan Steroid tapering dose Levaquin Reevaluation after the PET scan is done Chest x-ray has been reviewed shows progressive atelectasis of the right lower lobe Chest x-ray has been reviewed independently and reviewed with the patient and compared with the previous chest x-ray The patient also has diabetes and was advised to get blood sugar below 200 few days prior to PET  scanning This and was advised to go back on the evening insulin because of prednisone therapy has been added  Lung cancer   Staging form: Lung, AJCC 7th Edition     Clinical: Stage IV (T3, N2, M1b) - Signed by Curt Bears, MD on 05/25/2013     Pathologic: No stage assigned - Marni Griffon, MD   02/09/2015 8:52 AM

## 2015-02-15 ENCOUNTER — Other Ambulatory Visit: Payer: Self-pay | Admitting: Oncology

## 2015-02-15 ENCOUNTER — Ambulatory Visit: Payer: Medicare Other

## 2015-02-16 ENCOUNTER — Ambulatory Visit
Admission: RE | Admit: 2015-02-16 | Discharge: 2015-02-16 | Disposition: A | Discharge status: Home / Self Care | Payer: Medicare Other | Source: Ambulatory Visit | Attending: Oncology | Admitting: Oncology

## 2015-02-16 DIAGNOSIS — C3490 Malignant neoplasm of unspecified part of unspecified bronchus or lung: Secondary | ICD-10-CM | POA: Diagnosis present

## 2015-02-16 DIAGNOSIS — C787 Secondary malignant neoplasm of liver and intrahepatic bile duct: Secondary | ICD-10-CM | POA: Insufficient documentation

## 2015-02-16 LAB — GLUCOSE, CAPILLARY: Glucose-Capillary: 93 mg/dL (ref 65–99)

## 2015-02-16 MED ORDER — FLUDEOXYGLUCOSE F - 18 (FDG) INJECTION
12.2400 | Freq: Once | INTRAVENOUS | Status: DC | PRN
  Administered 2015-02-16: 12.24 via INTRAVENOUS
  Filled 2015-02-16: qty 12.24

## 2015-02-21 ENCOUNTER — Inpatient Hospital Stay (HOSPITAL_BASED_OUTPATIENT_CLINIC_OR_DEPARTMENT_OTHER): Payer: Medicare Other | Admitting: Oncology

## 2015-02-21 VITALS — BP 112/66 | HR 88 | Temp 97.5°F | Wt 190.9 lb

## 2015-02-21 DIAGNOSIS — Z85828 Personal history of other malignant neoplasm of skin: Secondary | ICD-10-CM

## 2015-02-21 DIAGNOSIS — Z923 Personal history of irradiation: Secondary | ICD-10-CM

## 2015-02-21 DIAGNOSIS — C7951 Secondary malignant neoplasm of bone: Secondary | ICD-10-CM

## 2015-02-21 DIAGNOSIS — I2510 Atherosclerotic heart disease of native coronary artery without angina pectoris: Secondary | ICD-10-CM

## 2015-02-21 DIAGNOSIS — R531 Weakness: Secondary | ICD-10-CM

## 2015-02-21 DIAGNOSIS — Z87891 Personal history of nicotine dependence: Secondary | ICD-10-CM

## 2015-02-21 DIAGNOSIS — J449 Chronic obstructive pulmonary disease, unspecified: Secondary | ICD-10-CM

## 2015-02-21 DIAGNOSIS — K219 Gastro-esophageal reflux disease without esophagitis: Secondary | ICD-10-CM

## 2015-02-21 DIAGNOSIS — Z794 Long term (current) use of insulin: Secondary | ICD-10-CM

## 2015-02-21 DIAGNOSIS — I129 Hypertensive chronic kidney disease with stage 1 through stage 4 chronic kidney disease, or unspecified chronic kidney disease: Secondary | ICD-10-CM

## 2015-02-21 DIAGNOSIS — E119 Type 2 diabetes mellitus without complications: Secondary | ICD-10-CM

## 2015-02-21 DIAGNOSIS — N189 Chronic kidney disease, unspecified: Secondary | ICD-10-CM

## 2015-02-21 DIAGNOSIS — C787 Secondary malignant neoplasm of liver and intrahepatic bile duct: Secondary | ICD-10-CM

## 2015-02-21 DIAGNOSIS — E7800 Pure hypercholesterolemia, unspecified: Secondary | ICD-10-CM

## 2015-02-21 DIAGNOSIS — C3490 Malignant neoplasm of unspecified part of unspecified bronchus or lung: Secondary | ICD-10-CM

## 2015-02-21 DIAGNOSIS — Z809 Family history of malignant neoplasm, unspecified: Secondary | ICD-10-CM

## 2015-02-21 DIAGNOSIS — C3431 Malignant neoplasm of lower lobe, right bronchus or lung: Secondary | ICD-10-CM | POA: Diagnosis not present

## 2015-02-21 DIAGNOSIS — Z79899 Other long term (current) drug therapy: Secondary | ICD-10-CM

## 2015-02-21 DIAGNOSIS — I714 Abdominal aortic aneurysm, without rupture: Secondary | ICD-10-CM

## 2015-02-21 DIAGNOSIS — Z8701 Personal history of pneumonia (recurrent): Secondary | ICD-10-CM

## 2015-02-21 DIAGNOSIS — Z9221 Personal history of antineoplastic chemotherapy: Secondary | ICD-10-CM

## 2015-02-21 DIAGNOSIS — R630 Anorexia: Secondary | ICD-10-CM

## 2015-02-24 ENCOUNTER — Encounter: Payer: Self-pay | Admitting: Oncology

## 2015-02-24 NOTE — Progress Notes (Signed)
[place] @ [facility] [phone]  Fax:([phone]) [place]: 08/01/1930  MR#: [id number]  JYN#:[id number]  Patient Care Team: Derinda Late, MD as PCP - General (Family Medicine)  CHIEF COMPLAINT:  Chief Complaint  Patient presents with  . Results   Oncology History   The patient is an 81 year old gentleman with stage IV squamous cell carcinoma of the lung who is seen for assessment prior to Nivolumab.  1. Squamous cell carcinoma of lung. Bronchoscopies positive for right lower lobe mass. Started on radiation therapy and chemotherapy.  August, 2012 Carboplatinum (AUC of 2) and Taxol with concurrent radiation therapy 2. Finished chemo /  radiation  in sept 2012. 3. Bronchoscopy was negative for any  malignant cells (August, 2013) 4. Continuing hemoptysis.  Repeat bronchoscopy is positive for malignant cells consistent with squamous cell carcinoma. 5. Palliative radiation and chemotherapy with carboplatinum and Taxol.(October, 2013) has finished radiation therapy and Taxol, carboplatinum in November of 2013 6.veristrat test is good 7.on Tarceva 8.progressing disease on Tarceva.  (January, 2015) discontinue Tarceva. 9.started on the investigational protocol with  [facility] February, 2015 10Patient is off protocol and continuing NIVO as. commercial product 11.  Complete evaluation during July 2 016 including thoracentesis cytology was negative for malignant cells     12.  Progressive disease by PET scan criteria.  Suggested chemotherapy with carboplatinum and gemcitabine starting from November of 2016  Oncology Flowsheet 10/09/2014 11/16/2014 11/30/2014 12/14/2014 12/28/2014 01/11/2015 01/26/2015  INV-nivolumab (BMS [id number]) IV - - - - - - -  nivolumab (OPDIVO) IV 3 mg/kg 3 mg/kg 3 mg/kg 3 mg/kg 3 mg/kg 3 mg/kg 3 mg/kg   changes.  Was also reviewed with the patient and compared with the previous  INTERVAL HISTORY: 81 year old gentleman with history of  recurrent carcinoma of lung\this and has increasing shortness of breath increasing pain.  Had a chest x-ray.  In the interim.  Had thoracentesis only 300 cc of fluid was removed.  Poor appetite. Cough yellowish expectoration Poor appetite and declining performance status Had a recent thoracentesis is 300 cc of fluid was removed there was no improvement in patient's shortness of breath With steroid and antibiotic patient's condition has somewhat improved.  PET scan has been done which has been reviewed independently and reviewed with the patient.  Shows progressive disease  REVIEW OF SYSTEMS:   Gen. status: Feeling weak and tired low-grade fever cardiac: No chest pain no palpitation HEENT: No soreness in the marked no difficulty swallowing.  GI: Nausea no vomiting no diarrhea skin: No rash. Lungs: Increasing cough.  Shortness of breath.  Low-grade fever.  Cough with expectoration. Abdomen: No diarrhea. Musculoskeletal system no bony pains. Diabetes is under well control all the blood sugar values have been reviewed.  Lower extremity no swelling.  Patient does have senile tremors. Appetite is poor Neurological system no headache or dizziness no other local symptoms.  All other systems have been reviewed  Patient is somewhat more depressed. HEENT: No soreness in the mouth or difficulty swallowing As per HPI. Otherwise, a complete review of systems is negatve.  PAST MEDICAL HISTORY: Past Medical History  Diagnosis Date  . Hypercholesteremia   . Hypertension   . Diabetes mellitus without complication ([place])   . AAA (abdominal aortic aneurysm) ([place])   . Lung cancer ([place])     09/2010  . Skin cancer   . CAD (coronary artery disease) stent placed  . COPD (chronic obstructive pulmonary disease) ([place])   .  Shortness of breath dyspnea   . Pneumonia   . Chronic kidney disease   . GERD (gastroesophageal reflux disease)   . Headache     PAST SURGICAL HISTORY: Past Surgical History  Procedure  Laterality Date  . Abdominal aortic aneurysm repair  2000  . Coronary stent placement  2000  . Tonsillectomy      when he was a child    FAMILY HISTORY Family History  Problem Relation Age of Onset  . Cancer Mother   . Cancer Sister   . Cancer Brother   . Cancer Maternal Aunt     Smoking History quit 10 years ago smoked 2 packs a day(1)  PFSH: Comments: family history of breast cancer and melanoma.  Comments: patient had smoked for 40 years one pack per day, quit smoking 10 years ago.  Does not drink  Additional Past Medical and Surgical History: coronary artery disease with stent placement.    Non-insulin-dependent diabetes.    Hypertension.    Multiple skin cancers removed including melanoma.    Aneurysm repair.  Hypercholesteremia     ADVANCED DIRECTIVES: Patient does have advanced healthcare directive   HEALTH MAINTENANCE: Social History  Substance Use Topics  . Smoking status: Former Smoker    Types: Cigarettes, Cigars    Quit date: 04/29/2003  . Smokeless tobacco: None  . Alcohol Use: No       Allergies  Allergen Reactions  . Penicillin G Rash  . Sulfa Antibiotics Rash    Current Outpatient Prescriptions  Medication Sig Dispense Refill  . ADVAIR DISKUS 250-50 MCG/DOSE AEPB INHALE 1 PUFF TWICE A DAY 60 each 0  . benzonatate (TESSALON) 200 MG capsule Take 200 mg by mouth 3 (three) times daily as needed for cough.    . chlorpheniramine-HYDROcodone (TUSSIONEX PENNKINETIC ER) 10-8 MG/5ML SUER Take 5 mLs by mouth every 12 (twelve) hours as needed for cough. 240 mL 0  . citalopram (CELEXA) 20 MG tablet Take 1 tablet (20 mg total) by mouth daily. 30 tablet 3  . fluticasone (FLONASE) 50 MCG/ACT nasal spray 1 spray. 1 spray in each nostril BID    . furosemide (LASIX) 20 MG tablet Take 1 tablet (20 mg total) by mouth every other day. 30 tablet   . ipratropium-albuterol (DUONEB) 0.5-2.5 (3) MG/3ML SOLN Take 3 mLs by nebulization every 8 (eight) hours as needed.      Marland Kitchen LANTUS SOLOSTAR 100 UNIT/ML Solostar Pen 22 Units. 22 units at bedtime    . lidocaine-prilocaine (EMLA) cream Apply to affected area once 30 g 3  . metoprolol succinate (TOPROL-XL) 100 MG 24 hr tablet Take 100 mg by mouth daily.    Marland Kitchen omeprazole (PRILOSEC) 40 MG capsule TAKE 1 CAPSULE (40 MG TOTAL) BY MOUTH DAILY. 30 capsule 0  . predniSONE (DELTASONE) 10 MG tablet 1 TAB(S) ORALLY ONCE A DAY 30 tablet 3  . PROAIR HFA 108 (90 BASE) MCG/ACT inhaler Inhale 2 puffs into the lungs 4 (four) times daily as needed.    Marland Kitchen SPIRIVA HANDIHALER 18 MCG inhalation capsule INHALE 1 CAPSULE WITH HANDIHALER ONCE A DAY 30 capsule 5  . tamsulosin (FLOMAX) 0.4 MG CAPS capsule Take 0.4 mg by mouth daily.  7   No current facility-administered medications for this visit.   Facility-Administered Medications Ordered in Other Visits  Medication Dose Route Frequency Provider Last Rate Last Dose  . sodium chloride 0.9 % injection 10 mL  10 mL Intracatheter PRN Forest Gleason, MD      .  sodium chloride 0.9 % injection 10 mL  10 mL Intracatheter PRN Forest Gleason, MD   10 mL at 09/11/14 1405  . sodium chloride 0.9 % injection 10 mL  10 mL Intracatheter PRN Forest Gleason, MD   10 mL at 09/26/14 1405  . sodium chloride 0.9 % injection 10 mL  10 mL Intracatheter PRN Forest Gleason, MD   10 mL at 02/08/15 1419    OBJECTIVE:  Filed Vitals:   02/21/15 1446  BP: 112/66  Pulse: 88  Temp: 97.5 F (36.4 C)     Body mass index is 27.39 kg/(m^2).    ECOG FS:1 - Symptomatic but completely ambulatory  PHYSICAL EXAM: Goal status: Performance status is climbing.  Patient has not lost significant weight HEENT: No evidence of stomatitis.   Sclera and conjunctivae :: No jaundice.   pale looking . Lungs: Crepitation and right base  Dullness on percussion on the right side.  Occasional rhonchi on both sides.  Cardiac: Heart sounds are normal.  No pericardial rub.  No murmur. Lymphatic system: Cervical, axillary, inguinal, lymph nodes  not palpable GI: Abdomen is soft.  No ascites.  Liver spleen not palpable.  No tenderness.  Bowel sounds are within normal limit Lower extremity: No edema Neurological system: Higher functions, cranial nerves intact No evidence of peripheral neuropathy. Skin: No rash.  No ecchymosis.. Musculoskeletal system within normal limit    LAB RESULTS:  No visits with results within 3 Day(s) from this visit. Latest known visit with results is:  Hospital Outpatient Visit on 02/16/2015  Component Date Value Ref Range Status  . Glucose-Capillary 02/16/2015 93  65 - 99 mg/dL Final      STUDIES: Dg Chest 2 View  02/08/2015  CLINICAL DATA:  Known malignant CT of the lower lobe of the right lung, now cough, wheezing, shortness of breath, history of COPD. EXAM: CHEST  2 VIEW COMPARISON:  Portable chest x-ray of January 29, 2015 FINDINGS: There remains volume loss on the right with with incomplete re-expansion of the lung. Slightly more fluid is present today. There is stable mild shift of the mediastinum toward the right. The left lung is well-expanded. The interstitial markings remain coarse but have slightly improved. The left heart border is normal. There is mild enlargement of the cardiac silhouette without pulmonary vascular congestion. The Port-A-Cath appliance tip projects over the midportion of the SVC. IMPRESSION: 1. Persistent persistent incompletely incomplete re-expansion of the right lung with multiple areas of loculated air and fluid. A small amount of air in the pleural space representing a tiny volume pneumothorax is present and stable. There is stable soft tissue density capping of the right pulmonary apex. Slightly more right pleural fluid present today than on the previous study. 2. Slight interval improvement in the appearance of the pulmonary interstitium on the left. Electronically Signed   By: David  Martinique M.D.   On: 02/08/2015 14:26   Dg Chest 2 View  01/29/2015  CLINICAL DATA:   Recurrent right-sided pleural effusion, status post right thoracentesis EXAM: CHEST - 2 VIEW COMPARISON:  01/25/2015 FINDINGS: Cardiac shadow is enlarged but stable. Left lung remains clear. A right chest wall port is again seen. There is been some slight reduction in the right-sided pleural effusion with incomplete re-expansion of the right lung secondary to the patient's known underlying neoplasm. Only a small amount of fluid was withdrawn and the degree of opacification is related to the consolidated lung as opposed to a large pleural effusion. IMPRESSION: Status post  right thoracentesis with incomplete re-expansion of the right lung secondary to the underlying neoplasm. The majority of the density seen on recent chest x-rays is related to consolidated lung as opposed to true pleural effusion. There is also likely a degree of multi loculation in the fluid as only a small amount of fluid was withdrawn. Electronically Signed   By: Inez Catalina M.D.   On: 01/29/2015 12:04   Nm Pet Image Restag (ps) Skull Base To Thigh  02/16/2015  CLINICAL DATA:  Subsequent treatment strategy for lung cancer. Restaging examination. EXAM: NUCLEAR MEDICINE PET SKULL BASE TO THIGH TECHNIQUE: 12.2 mCi F-18 FDG was injected intravenously. Full-ring PET imaging was performed from the skull base to thigh after the radiotracer. CT data was obtained and used for attenuation correction and anatomic localization. FASTING BLOOD GLUCOSE:  Value: 93 mg/dl COMPARISON:  PET-CT 10/25/2014. FINDINGS: NECK No hypermetabolic lymph nodes in the neck. CHEST Interval enlargement of the previously noted hypervascular mass centered in the medial aspect of the right lower lobe, which is currently a dumbbell-shaped lesion measuring 4.5 x 7.9 cm (image 117 of series 3). The lateral aspect of this lesion remains within the right lower lobe, while the medial aspect has clearly invaded into the left atrium likely a along the course of the right inferior  pulmonary vein. This mass encases the bronchus intermedius which is completely obstructed by the mass. There is extensive soft tissue thickening around the right upper lobe bronchus as well, however, this area demonstrates only low-level hypermetabolism. No other mediastinal or left hilar hypermetabolic lymphadenopathy is noted. The right lower lobe appears completely chronically collapsed, as does the right middle lobe. There is extensive septal thickening throughout the right upper lobe, which is likely post treatment related. Small 6 mm pleural-based nodule in the apex of the right upper lobe (image 74 of series 3) demonstrates no hypermetabolism and is similar in retrospect compared to the prior study. Complex pleural fluid and gas collection which appears have multiple internal loculations and multiple air-fluid levels. There is only low-level metabolic activity associated with the right pleural space, suggesting against active infection (i.e., this does not appear to represent an empyema). Heart size is mildly enlarged. Atherosclerotic calcifications in the left anterior descending, left circumflex and right coronary arteries. Several tiny pulmonary nodules in the left lung appear similar to the prior examination and are highly nonspecific measuring up to 4 mm in the left lower lobe (image 91 of series 3). Right internal jugular single-lumen porta cath with tip terminating at the superior cavoatrial junction. ABDOMEN/PELVIS There is at least 1 tiny focus of hypermetabolism (SUVmax = 5.6) in the right lobe of the liver (segment 4A), where there is a very ill-defined area of low attenuation (image 132 of series 3), highly suspicious for a metastatic lesion. No abnormal hypermetabolic activity within the pancreas, adrenal glands, or spleen. No hypermetabolic lymph nodes in the abdomen or pelvis. Multiple well-defined low-attenuation lesions in the kidneys bilaterally demonstrate no internal hypermetabolism, and  although they are not definitively characterized on today's noncontrast CT examination, these are likely cysts, largest of which measures 2.9 cm in the upper pole of the right kidney. Atherosclerosis throughout the abdominal and pelvic vasculature, with fusiform infrarenal abdominal aortic aneurysm measuring 3.6 x 3.4 cm (unchanged) and postoperative changes of aorto bi-iliac bypass graft placement. Normal appendix. Numerous colonic diverticuli are noted, without surrounding inflammatory changes to suggest an acute diverticulitis at this time. No significant volume of ascites. No pneumoperitoneum. SKELETON Numerous  foci of skeletal hypermetabolism, which are most commonly seen in the pelvis. Many of these are occult on CT images, but some are slightly lidocaine appearance, including an 8 mm lesion in the central aspect of the sacrum (image 223 of series 3) which is hypermetabolic (SUVmax = 5.8). IMPRESSION: 1. Today's study demonstrates progression of disease as evidenced by interval enlargement of the right lower lobe mass which is clearly invading the left atrium of the heart via the right inferior pulmonary vein. There is some low-level metabolic activity throughout the right pleural space, which may indicate a malignant effusion, or may simply be a related to the patient's complex right-sided hydropneumothorax and associated inflammation) no overt findings to strongly suggest empyema at this time, however, sampling of the pleural fluid is suggested). 2. 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 3. Additional incidental findings, as above. Electronically Signed   By: Vinnie Langton M.D.   On: 02/16/2015 11:02   US Thoracentesis Asp Pleural Space W/img Guide  01/29/2015  CLINICAL DATA:  Carcinoma of the lung with recurrent right-sided effusion EXAM: ULTRASOUND GUIDED right THORACENTESIS PROCEDURE: An ultrasound guided thoracentesis  was thoroughly discussed with the patient and questions answered. The benefits, risks, alternatives and complications were also discussed. The patient understands and wishes to proceed with the procedure. Written consent was obtained. Ultrasound was performed to localize and mark an adequate pocket of fluid in the right chest. The area was then prepped and draped in the normal sterile fashion. 1% Lidocaine was used for local anesthesia. Under ultrasound guidance a 6 French thoracentesis catheter was introduced. Thoracentesis was performed. The catheter was removed and a dressing applied. COMPLICATIONS: None immediate FINDINGS: A total of approximately 300 mL of clear yellow fluid was removed. A fluid sample was not sent for laboratory analysis. IMPRESSION: Successful ultrasound guided right thoracentesis yielding 300 mL of pleural fluid. The majority of the density seen on recent chest x-ray is related to consolidated lung as opposed to true pleural effusion. Electronically Signed   By: Inez Catalina M.D.   On: 01/29/2015 12:06    ASSESSMENT: Recurrent squamous cell carcinoma of right lower lobe. Patient recently had thoracentesis only 300 cc of fluid was removed there was no improvement in shortness of breath Repeat chest x-ray shows persistent right lower lobe consolidation progressing mass Possibility of PET scan Hold NIVOLULAMAB probably progressing disease on NIVOLULAMAB Start patient on prednisone tapering doses with Levaquin Consider PDL for [place]:  PET scan has been reviewed independently and shows progressive disease in the lung mass is invading left atrium.  There are new areas of bone metastases.  There are also abnormal areas in the liver Lung cancer  I had prolonged discussion with patient and family.  I reviewed PET scan with the patient and family also had discussion with daughter on speaker phone at the same time  Following options at been  discussed Progressive disease on 2 different lines of treatment Consider possibility of carboplatinum and gemcitabine And [place] See because of bone metastases Consult if patient is not willing to go through the treatment then consider hospice and palliative care to control symptoms Patient's performance status is still good.  And patient desires to consider third line of chemotherapy  Total duration of visit was 45 minutes.  50% or more time was spent in counseling patient and family regarding prognosis and options of treatment and available resources.  discussion  regarding all the options of treatment   Staging form: Lung, AJCC 7th Edition     Clinical: Stage IV (T3, N2, M1b) - Signed by Curt Bears, MD on 05/25/2013     Pathologic: No stage assigned - Marni Griffon, MD   02/24/2015 3:37 PM

## 2015-02-27 ENCOUNTER — Inpatient Hospital Stay: Payer: Medicare Other

## 2015-02-27 ENCOUNTER — Inpatient Hospital Stay: Payer: Medicare Other | Attending: Oncology

## 2015-02-27 ENCOUNTER — Inpatient Hospital Stay (HOSPITAL_BASED_OUTPATIENT_CLINIC_OR_DEPARTMENT_OTHER): Payer: Medicare Other | Admitting: Oncology

## 2015-02-27 ENCOUNTER — Encounter: Payer: Self-pay | Admitting: Oncology

## 2015-02-27 VITALS — BP 110/62 | HR 104 | Temp 96.7°F | Wt 188.1 lb

## 2015-02-27 DIAGNOSIS — R0602 Shortness of breath: Secondary | ICD-10-CM

## 2015-02-27 DIAGNOSIS — C3431 Malignant neoplasm of lower lobe, right bronchus or lung: Secondary | ICD-10-CM

## 2015-02-27 DIAGNOSIS — E119 Type 2 diabetes mellitus without complications: Secondary | ICD-10-CM | POA: Insufficient documentation

## 2015-02-27 DIAGNOSIS — R21 Rash and other nonspecific skin eruption: Secondary | ICD-10-CM | POA: Diagnosis not present

## 2015-02-27 DIAGNOSIS — C787 Secondary malignant neoplasm of liver and intrahepatic bile duct: Secondary | ICD-10-CM

## 2015-02-27 DIAGNOSIS — R630 Anorexia: Secondary | ICD-10-CM

## 2015-02-27 DIAGNOSIS — Z87891 Personal history of nicotine dependence: Secondary | ICD-10-CM | POA: Insufficient documentation

## 2015-02-27 DIAGNOSIS — Z85828 Personal history of other malignant neoplasm of skin: Secondary | ICD-10-CM

## 2015-02-27 DIAGNOSIS — Z794 Long term (current) use of insulin: Secondary | ICD-10-CM

## 2015-02-27 DIAGNOSIS — I2510 Atherosclerotic heart disease of native coronary artery without angina pectoris: Secondary | ICD-10-CM | POA: Insufficient documentation

## 2015-02-27 DIAGNOSIS — I714 Abdominal aortic aneurysm, without rupture: Secondary | ICD-10-CM | POA: Diagnosis not present

## 2015-02-27 DIAGNOSIS — C3490 Malignant neoplasm of unspecified part of unspecified bronchus or lung: Secondary | ICD-10-CM

## 2015-02-27 DIAGNOSIS — Z923 Personal history of irradiation: Secondary | ICD-10-CM

## 2015-02-27 DIAGNOSIS — C7951 Secondary malignant neoplasm of bone: Secondary | ICD-10-CM | POA: Insufficient documentation

## 2015-02-27 DIAGNOSIS — F329 Major depressive disorder, single episode, unspecified: Secondary | ICD-10-CM | POA: Diagnosis not present

## 2015-02-27 DIAGNOSIS — K219 Gastro-esophageal reflux disease without esophagitis: Secondary | ICD-10-CM | POA: Insufficient documentation

## 2015-02-27 DIAGNOSIS — R509 Fever, unspecified: Secondary | ICD-10-CM | POA: Diagnosis not present

## 2015-02-27 DIAGNOSIS — E7800 Pure hypercholesterolemia, unspecified: Secondary | ICD-10-CM | POA: Diagnosis not present

## 2015-02-27 DIAGNOSIS — R05 Cough: Secondary | ICD-10-CM | POA: Insufficient documentation

## 2015-02-27 DIAGNOSIS — Z79899 Other long term (current) drug therapy: Secondary | ICD-10-CM

## 2015-02-27 DIAGNOSIS — N189 Chronic kidney disease, unspecified: Secondary | ICD-10-CM | POA: Insufficient documentation

## 2015-02-27 DIAGNOSIS — R531 Weakness: Secondary | ICD-10-CM | POA: Insufficient documentation

## 2015-02-27 DIAGNOSIS — I129 Hypertensive chronic kidney disease with stage 1 through stage 4 chronic kidney disease, or unspecified chronic kidney disease: Secondary | ICD-10-CM | POA: Insufficient documentation

## 2015-02-27 DIAGNOSIS — Z5111 Encounter for antineoplastic chemotherapy: Secondary | ICD-10-CM | POA: Insufficient documentation

## 2015-02-27 DIAGNOSIS — J449 Chronic obstructive pulmonary disease, unspecified: Secondary | ICD-10-CM

## 2015-02-27 DIAGNOSIS — Z7952 Long term (current) use of systemic steroids: Secondary | ICD-10-CM | POA: Diagnosis not present

## 2015-02-27 LAB — BASIC METABOLIC PANEL
ANION GAP: 4 — AB (ref 5–15)
BUN: 19 mg/dL (ref 6–20)
CALCIUM: 8.4 mg/dL — AB (ref 8.9–10.3)
CHLORIDE: 103 mmol/L (ref 101–111)
CO2: 31 mmol/L (ref 22–32)
Creatinine, Ser: 1.3 mg/dL — ABNORMAL HIGH (ref 0.61–1.24)
GFR calc non Af Amer: 50 mL/min — ABNORMAL LOW (ref >60)
GFR, EST AFRICAN AMERICAN: 58 mL/min — AB (ref >60)
GLUCOSE: 167 mg/dL — AB (ref 65–99)
Potassium: 3.4 mmol/L — ABNORMAL LOW (ref 3.5–5.1)
Sodium: 138 mmol/L (ref 135–145)

## 2015-02-27 LAB — MAGNESIUM: Magnesium: 1.8 mg/dL (ref 1.7–2.4)

## 2015-02-27 MED ORDER — HEPARIN SOD (PORK) LOCK FLUSH 100 UNIT/ML IV SOLN
500.0000 [IU] | Freq: Once | INTRAVENOUS | Status: AC | PRN
  Administered 2015-02-27: 500 [IU]
  Filled 2015-02-27: qty 5

## 2015-02-27 MED ORDER — SODIUM CHLORIDE 0.9 % IJ SOLN
10.0000 mL | INTRAMUSCULAR | Status: DC | PRN
  Administered 2015-02-27: 10 mL
  Filled 2015-02-27: qty 10

## 2015-02-27 MED ORDER — SODIUM CHLORIDE 0.9 % IV SOLN
Freq: Once | INTRAVENOUS | Status: AC
  Administered 2015-02-27: 11:00:00 via INTRAVENOUS
  Filled 2015-02-27: qty 1000

## 2015-02-27 MED ORDER — SODIUM CHLORIDE 0.9 % IV SOLN
2000.0000 mg | Freq: Once | INTRAVENOUS | Status: AC
  Administered 2015-02-27: 2000 mg via INTRAVENOUS
  Filled 2015-02-27: qty 52.6

## 2015-02-27 MED ORDER — CARBOPLATIN CHEMO INJECTION 600 MG/60ML
398.0000 mg | Freq: Once | INTRAVENOUS | Status: AC
  Administered 2015-02-27: 400 mg via INTRAVENOUS
  Filled 2015-02-27: qty 40

## 2015-02-27 MED ORDER — SODIUM CHLORIDE 0.9 % IV SOLN
Freq: Once | INTRAVENOUS | Status: AC
  Administered 2015-02-27: 11:00:00 via INTRAVENOUS
  Filled 2015-02-27: qty 8

## 2015-02-27 MED ORDER — ONDANSETRON HCL 4 MG PO TABS: 4.0000 mg | ORAL_TABLET | Freq: Four times a day (QID) | ORAL | Status: AC | PRN

## 2015-02-27 NOTE — Progress Notes (Signed)
[facility] @ [facility] [phone]  Fax:([phone]) Gillespie: 03-Sep-1930  MR#: [id number]  QVZ#:[id number]  Patient Care Team: Derinda Late, MD as PCP - General (Family Medicine)  CHIEF COMPLAINT:  Chief Complaint  Patient presents with  . OTHER   Oncology History   The patient is an 81 year old gentleman with stage IV squamous cell carcinoma of the lung who is seen for assessment prior to Nivolumab.  1. Squamous cell carcinoma of lung. Bronchoscopies positive for right lower lobe mass. Started on radiation therapy and chemotherapy.  August, 2012 Carboplatinum (AUC of 2) and Taxol with concurrent radiation therapy 2. Finished chemo /  radiation  in sept 2012. 3. Bronchoscopy was negative for any  malignant cells (August, 2013) 4. Continuing hemoptysis.  Repeat bronchoscopy is positive for malignant cells consistent with squamous cell carcinoma. 5. Palliative radiation and chemotherapy with carboplatinum and Taxol.(October, 2013) has finished radiation therapy and Taxol, carboplatinum in November of 2013 6.veristrat test is good 7.on Tarceva 8.progressing disease on Tarceva.  (January, 2015) discontinue Tarceva. 9.started on the investigational protocol with  [facility] February, 2015 10Patient is off protocol and continuing NIVO as. commercial product 11.  Complete evaluation during July 2 016 including thoracentesis cytology was negative for malignant cells     12.  Progressive disease by PET scan criteria.  Suggested chemotherapy with carboplatinum and gemcitabine starting from November of 2016  Oncology Flowsheet 10/09/2014 11/16/2014 11/30/2014 12/14/2014 12/28/2014 01/11/2015 01/26/2015  INV-nivolumab (BMS [id number]) IV - - - - - - -  nivolumab (OPDIVO) IV 3 mg/kg 3 mg/kg 3 mg/kg 3 mg/kg 3 mg/kg 3 mg/kg 3 mg/kg   changes.  Was also reviewed with the patient and compared with the previous  INTERVAL HISTORY: 81 year old gentleman with history of  recurrent carcinoma of lung\this and has increasing shortness of breath increasing pain.  Had a chest x-ray.  In the interim.  Had thoracentesis only 300 cc of fluid was removed.  Poor appetite. Cough yellowish expectoration Poor appetite and declining performance status Had a recent thoracentesis is 300 cc of fluid was removed there was no improvement in patient's shortness of breath With steroid and antibiotic patient's condition has somewhat improved.  PET scan has been done which has been reviewed independently and reviewed with the patient.  Shows progressive disease  REVIEW OF SYSTEMS:   Gen. status: Feeling weak and tired low-grade fever cardiac: No chest pain no palpitation HEENT: No soreness in the marked no difficulty swallowing.  GI: Nausea no vomiting no diarrhea skin: No rash. Lungs: Increasing cough.  Shortness of breath.  Low-grade fever.  Cough with expectoration. Abdomen: No diarrhea. Musculoskeletal system no bony pains. Diabetes is under well control all the blood sugar values have been reviewed.  Lower extremity no swelling.  Patient does have senile tremors. Appetite is poor Neurological system no headache or dizziness no other local symptoms.  All other systems have been reviewed  Patient is somewhat more depressed. HEENT: No soreness in the mouth or difficulty swallowing As per HPI. Otherwise, a complete review of systems is negatve.  PAST MEDICAL HISTORY: Past Medical History  Diagnosis Date  . Hypercholesteremia   . Hypertension   . Diabetes mellitus without complication ([place])   . AAA (abdominal aortic aneurysm) ([place])   . Lung cancer ([place])     09/2010  . Skin cancer   . CAD (coronary artery disease) stent placed  . COPD (chronic obstructive pulmonary disease) ([place])   .  Shortness of breath dyspnea   . Pneumonia   . Chronic kidney disease   . GERD (gastroesophageal reflux disease)   . Headache     PAST SURGICAL HISTORY: Past Surgical History  Procedure  Laterality Date  . Abdominal aortic aneurysm repair  2000  . Coronary stent placement  2000  . Tonsillectomy      when he was a child    FAMILY HISTORY Family History  Problem Relation Age of Onset  . Cancer Mother   . Cancer Sister   . Cancer Brother   . Cancer Maternal Aunt     Smoking History quit 10 years ago smoked 2 packs a day(1)  PFSH: Comments: family history of breast cancer and melanoma.  Comments: patient had smoked for 40 years one pack per day, quit smoking 10 years ago.  Does not drink  Additional Past Medical and Surgical History: coronary artery disease with stent placement.    Non-insulin-dependent diabetes.    Hypertension.    Multiple skin cancers removed including melanoma.    Aneurysm repair.  Hypercholesteremia     ADVANCED DIRECTIVES: Patient does have advanced healthcare directive   HEALTH MAINTENANCE: Social History  Substance Use Topics  . Smoking status: Former Smoker    Types: Cigarettes, Cigars    Quit date: 04/29/2003  . Smokeless tobacco: None  . Alcohol Use: No       Allergies  Allergen Reactions  . Penicillin G Rash  . Sulfa Antibiotics Rash    Current Outpatient Prescriptions  Medication Sig Dispense Refill  . ADVAIR DISKUS 250-50 MCG/DOSE AEPB INHALE 1 PUFF TWICE A DAY 60 each 0  . benzonatate (TESSALON) 200 MG capsule Take 200 mg by mouth 3 (three) times daily as needed for cough.    . chlorpheniramine-HYDROcodone (TUSSIONEX PENNKINETIC ER) 10-8 MG/5ML SUER Take 5 mLs by mouth every 12 (twelve) hours as needed for cough. 240 mL 0  . citalopram (CELEXA) 20 MG tablet Take 1 tablet (20 mg total) by mouth daily. 30 tablet 3  . fluticasone (FLONASE) 50 MCG/ACT nasal spray 1 spray. 1 spray in each nostril BID    . furosemide (LASIX) 20 MG tablet Take 1 tablet (20 mg total) by mouth every other day. 30 tablet   . ipratropium-albuterol (DUONEB) 0.5-2.5 (3) MG/3ML SOLN Take 3 mLs by nebulization every 8 (eight) hours as needed.      [place] Kitchen LANTUS SOLOSTAR 100 UNIT/ML Solostar Pen 22 Units. 22 units at bedtime    . metoprolol succinate (TOPROL-XL) 100 MG 24 hr tablet Take 100 mg by mouth daily.    [place] Kitchen omeprazole (PRILOSEC) 40 MG capsule TAKE 1 CAPSULE (40 MG TOTAL) BY MOUTH DAILY. 30 capsule 0  . predniSONE (DELTASONE) 10 MG tablet 1 TAB(S) ORALLY ONCE A DAY 30 tablet 3  . PROAIR HFA 108 (90 BASE) MCG/ACT inhaler Inhale 2 puffs into the lungs 4 (four) times daily as needed.    [place] Kitchen SPIRIVA HANDIHALER 18 MCG inhalation capsule INHALE 1 CAPSULE WITH HANDIHALER ONCE A DAY 30 capsule 5  . tamsulosin (FLOMAX) 0.4 MG CAPS capsule Take 0.4 mg by mouth daily.  7   No current facility-administered medications for this visit.   Facility-Administered Medications Ordered in Other Visits  Medication Dose Route Frequency Provider Last Rate Last Dose  . sodium chloride 0.9 % injection 10 mL  10 mL Intracatheter PRN Forest Gleason, MD   10 mL at 09/26/14 1405  . sodium chloride 0.9 % injection 10 mL  10 mL  Intracatheter PRN Forest Gleason, MD   10 mL at 02/08/15 1419    OBJECTIVE:  Filed Vitals:   02/27/15 0901  BP: 110/2  Pulse: 104  Temp: 96.7 F (35.9 C)     Body mass index is 26.98 kg/(m^2).    ECOG FS:1 - Symptomatic but completely ambulatory  PHYSICAL EXAM: Goal status: Performance status is climbing.  Patient has not lost significant weight HEENT: No evidence of stomatitis.   Sclera and conjunctivae :: No jaundice.   pale looking . Lungs: Crepitation and right base  Dullness on percussion on the right side.  Occasional rhonchi on both sides.  Cardiac: Heart sounds are normal.  No pericardial rub.  No murmur. Lymphatic system: Cervical, axillary, inguinal, lymph nodes not palpable GI: Abdomen is soft.  No ascites.  Liver spleen not palpable.  No tenderness.  Bowel sounds are within normal limit Lower extremity: No edema Neurological system: Higher functions, cranial nerves intact No evidence of peripheral neuropathy. Skin: No  rash.  No ecchymosis.. Musculoskeletal system within normal limit    LAB RESULTS:  No visits with results within 3 Day(s) from this visit. Latest known visit with results is:  Hospital Outpatient Visit on 02/16/2015  Component Date Value Ref Range Status  . Glucose-Capillary 02/16/2015 93  65 - 99 mg/dL Final      STUDIES: Dg Chest 2 View  02/08/2015  CLINICAL DATA:  Known malignant CT of the lower lobe of the right lung, now cough, wheezing, shortness of breath, history of COPD. EXAM: CHEST  2 VIEW COMPARISON:  Portable chest x-ray of January 29, 2015 FINDINGS: There remains volume loss on the right with with incomplete re-expansion of the lung. Slightly more fluid is present today. There is stable mild shift of the mediastinum toward the right. The left lung is well-expanded. The interstitial markings remain coarse but have slightly improved. The left heart border is normal. There is mild enlargement of the cardiac silhouette without pulmonary vascular congestion. The Port-A-Cath appliance tip projects over the midportion of the SVC. IMPRESSION: 1. Persistent persistent incompletely incomplete re-expansion of the right lung with multiple areas of loculated air and fluid. A small amount of air in the pleural space representing a tiny volume pneumothorax is present and stable. There is stable soft tissue density capping of the right pulmonary apex. Slightly more right pleural fluid present today than on the previous study. 2. Slight interval improvement in the appearance of the pulmonary interstitium on the left. Electronically Signed   By: David  Martinique M.D.   On: 02/08/2015 14:26   Dg Chest 2 View  01/29/2015  CLINICAL DATA:  Recurrent right-sided pleural effusion, status post right thoracentesis EXAM: CHEST - 2 VIEW COMPARISON:  01/25/2015 FINDINGS: Cardiac shadow is enlarged but stable. Left lung remains clear. A right chest wall port is again seen. There is been some slight reduction in  the right-sided pleural effusion with incomplete re-expansion of the right lung secondary to the patient's known underlying neoplasm. Only a small amount of fluid was withdrawn and the degree of opacification is related to the consolidated lung as opposed to a large pleural effusion. IMPRESSION: Status post right thoracentesis with incomplete re-expansion of the right lung secondary to the underlying neoplasm. The majority of the density seen on recent chest x-rays is related to consolidated lung as opposed to true pleural effusion. There is also likely a degree of multi loculation in the fluid as only a small amount of fluid was withdrawn. Electronically Signed  By: Inez Catalina M.D.   On: 01/29/2015 12:04   Nm Pet Image Restag (ps) Skull Base To Thigh  02/16/2015  CLINICAL DATA:  Subsequent treatment strategy for lung cancer. Restaging examination. EXAM: NUCLEAR MEDICINE PET SKULL BASE TO THIGH TECHNIQUE: 12.2 mCi F-18 FDG was injected intravenously. Full-ring PET imaging was performed from the skull base to thigh after the radiotracer. CT data was obtained and used for attenuation correction and anatomic localization. FASTING BLOOD GLUCOSE:  Value: 93 mg/dl COMPARISON:  PET-CT 10/25/2014. FINDINGS: NECK No hypermetabolic lymph nodes in the neck. CHEST Interval enlargement of the previously noted hypervascular mass centered in the medial aspect of the right lower lobe, which is currently a dumbbell-shaped lesion measuring 4.5 x 7.9 cm (image 117 of series 3). The lateral aspect of this lesion remains within the right lower lobe, while the medial aspect has clearly invaded into the left atrium likely a along the course of the right inferior pulmonary vein. This mass encases the bronchus intermedius which is completely obstructed by the mass. There is extensive soft tissue thickening around the right upper lobe bronchus as well, however, this area demonstrates only low-level hypermetabolism. No other  mediastinal or left hilar hypermetabolic lymphadenopathy is noted. The right lower lobe appears completely chronically collapsed, as does the right middle lobe. There is extensive septal thickening throughout the right upper lobe, which is likely post treatment related. Small 6 mm pleural-based nodule in the apex of the right upper lobe (image 74 of series 3) demonstrates no hypermetabolism and is similar in retrospect compared to the prior study. Complex pleural fluid and gas collection which appears have multiple internal loculations and multiple air-fluid levels. There is only low-level metabolic activity associated with the right pleural space, suggesting against active infection (i.e., this does not appear to represent an empyema). Heart size is mildly enlarged. Atherosclerotic calcifications in the left anterior descending, left circumflex and right coronary arteries. Several tiny pulmonary nodules in the left lung appear similar to the prior examination and are highly nonspecific measuring up to 4 mm in the left lower lobe (image 91 of series 3). Right internal jugular single-lumen porta cath with tip terminating at the superior cavoatrial junction. ABDOMEN/PELVIS There is at least 1 tiny focus of hypermetabolism (SUVmax = 5.6) in the right lobe of the liver (segment 4A), where there is a very ill-defined area of low attenuation (image 132 of series 3), highly suspicious for a metastatic lesion. No abnormal hypermetabolic activity within the pancreas, adrenal glands, or spleen. No hypermetabolic lymph nodes in the abdomen or pelvis. Multiple well-defined low-attenuation lesions in the kidneys bilaterally demonstrate no internal hypermetabolism, and although they are not definitively characterized on today's noncontrast CT examination, these are likely cysts, largest of which measures 2.9 cm in the upper pole of the right kidney. Atherosclerosis throughout the abdominal and pelvic vasculature, with fusiform  infrarenal abdominal aortic aneurysm measuring 3.6 x 3.4 cm (unchanged) and postoperative changes of aorto bi-iliac bypass graft placement. Normal appendix. Numerous colonic diverticuli are noted, without surrounding inflammatory changes to suggest an acute diverticulitis at this time. No significant volume of ascites. No pneumoperitoneum. SKELETON Numerous foci of skeletal hypermetabolism, which are most commonly seen in the pelvis. Many of these are occult on CT images, but some are slightly lidocaine appearance, including an 8 mm lesion in the central aspect of the sacrum (image 223 of series 3) which is hypermetabolic (SUVmax = 5.8). IMPRESSION: 1. Today's study demonstrates progression of disease as evidenced by  interval enlargement of the right lower lobe mass which is clearly invading the left atrium of the heart via the right inferior pulmonary vein. There is some low-level metabolic activity throughout the right pleural space, which may indicate a malignant effusion, or may simply be a related to the patient's complex right-sided hydropneumothorax and associated inflammation) no overt findings to strongly suggest empyema at this time, however, sampling of the pleural fluid is suggested). 2. 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 3. Additional incidental findings, as above. Electronically Signed   By: Vinnie Langton M.D.   On: 02/16/2015 11:02   US Thoracentesis Asp Pleural Space W/img Guide  01/29/2015  CLINICAL DATA:  Carcinoma of the lung with recurrent right-sided effusion EXAM: ULTRASOUND GUIDED right THORACENTESIS PROCEDURE: An ultrasound guided thoracentesis was thoroughly discussed with the patient and questions answered. The benefits, risks, alternatives and complications were also discussed. The patient understands and wishes to proceed with the procedure. Written consent was obtained. Ultrasound was performed to  localize and mark an adequate pocket of fluid in the right chest. The area was then prepped and draped in the normal sterile fashion. 1% Lidocaine was used for local anesthesia. Under ultrasound guidance a 6 French thoracentesis catheter was introduced. Thoracentesis was performed. The catheter was removed and a dressing applied. COMPLICATIONS: None immediate FINDINGS: A total of approximately 300 mL of clear yellow fluid was removed. A fluid sample was not sent for laboratory analysis. IMPRESSION: Successful ultrasound guided right thoracentesis yielding 300 mL of pleural fluid. The majority of the density seen on recent chest x-ray is related to consolidated lung as opposed to true pleural effusion. Electronically Signed   By: Inez Catalina M.D.   On: 01/29/2015 12:06    ASSESSMENT: Recurrent squamous cell carcinoma of right lower lobe. Patient recently had thoracentesis only 300 cc of fluid was removed there was no improvement in shortness of breath Repeat chest x-ray shows persistent right lower lobe consolidation progressing mass Possibility of PET scan Hold NIVOLULAMAB probably progressing disease on NIVOLULAMAB Start patient on prednisone tapering doses with Levaquin Consider PDL for [place]:  PET scan has been reviewed independently and shows progressive disease in the lung mass is invading left atrium.  There are new areas of bone metastases.  There are also abnormal areas in the liver Lung cancer Intent of chemotherapy is palliation and relief in symptoms and extending survival Patient was explained all the side effects of chemotherapy.  And informed consent has been obtained Patient and family was alerted regarding carboplatinum and allergic reaction Patient and family still had number of questions about chemotherapy Will proceed with carboplatinum and gemcitabine Consider  XGEVA Total duration of visit was25 minutes.  50% or more time was spent in counseling  patient and family regarding prognosis and options of treatment and available resources   Family was here to discuss all the options of treatment    Staging form: Lung, AJCC 7th Edition     Clinical: Stage IV (T3, N2, M1b) - Signed by Curt Bears, MD on 05/25/2013     Pathologic: No stage assigned - Marni Griffon, MD   02/27/2015 9:08 AM

## 2015-03-06 ENCOUNTER — Inpatient Hospital Stay: Payer: Medicare Other

## 2015-03-06 ENCOUNTER — Inpatient Hospital Stay (HOSPITAL_BASED_OUTPATIENT_CLINIC_OR_DEPARTMENT_OTHER): Payer: Medicare Other | Admitting: Oncology

## 2015-03-06 ENCOUNTER — Encounter: Payer: Self-pay | Admitting: Oncology

## 2015-03-06 VITALS — BP 129/76 | HR 85 | Temp 97.0°F | Wt 188.1 lb

## 2015-03-06 DIAGNOSIS — C7951 Secondary malignant neoplasm of bone: Secondary | ICD-10-CM

## 2015-03-06 DIAGNOSIS — N189 Chronic kidney disease, unspecified: Secondary | ICD-10-CM

## 2015-03-06 DIAGNOSIS — C787 Secondary malignant neoplasm of liver and intrahepatic bile duct: Secondary | ICD-10-CM | POA: Diagnosis not present

## 2015-03-06 DIAGNOSIS — E7800 Pure hypercholesterolemia, unspecified: Secondary | ICD-10-CM

## 2015-03-06 DIAGNOSIS — R630 Anorexia: Secondary | ICD-10-CM

## 2015-03-06 DIAGNOSIS — F329 Major depressive disorder, single episode, unspecified: Secondary | ICD-10-CM

## 2015-03-06 DIAGNOSIS — I2510 Atherosclerotic heart disease of native coronary artery without angina pectoris: Secondary | ICD-10-CM

## 2015-03-06 DIAGNOSIS — R0602 Shortness of breath: Secondary | ICD-10-CM

## 2015-03-06 DIAGNOSIS — Z79899 Other long term (current) drug therapy: Secondary | ICD-10-CM | POA: Diagnosis not present

## 2015-03-06 DIAGNOSIS — C3431 Malignant neoplasm of lower lobe, right bronchus or lung: Secondary | ICD-10-CM

## 2015-03-06 DIAGNOSIS — Z794 Long term (current) use of insulin: Secondary | ICD-10-CM

## 2015-03-06 DIAGNOSIS — J449 Chronic obstructive pulmonary disease, unspecified: Secondary | ICD-10-CM

## 2015-03-06 DIAGNOSIS — K219 Gastro-esophageal reflux disease without esophagitis: Secondary | ICD-10-CM

## 2015-03-06 DIAGNOSIS — Z5111 Encounter for antineoplastic chemotherapy: Secondary | ICD-10-CM | POA: Diagnosis not present

## 2015-03-06 DIAGNOSIS — Z7952 Long term (current) use of systemic steroids: Secondary | ICD-10-CM

## 2015-03-06 DIAGNOSIS — Z923 Personal history of irradiation: Secondary | ICD-10-CM

## 2015-03-06 DIAGNOSIS — R05 Cough: Secondary | ICD-10-CM

## 2015-03-06 DIAGNOSIS — R509 Fever, unspecified: Secondary | ICD-10-CM

## 2015-03-06 DIAGNOSIS — R531 Weakness: Secondary | ICD-10-CM

## 2015-03-06 DIAGNOSIS — Z85828 Personal history of other malignant neoplasm of skin: Secondary | ICD-10-CM

## 2015-03-06 DIAGNOSIS — I714 Abdominal aortic aneurysm, without rupture: Secondary | ICD-10-CM

## 2015-03-06 DIAGNOSIS — E119 Type 2 diabetes mellitus without complications: Secondary | ICD-10-CM

## 2015-03-06 DIAGNOSIS — Z87891 Personal history of nicotine dependence: Secondary | ICD-10-CM

## 2015-03-06 LAB — CBC WITH DIFFERENTIAL/PLATELET
BASOS ABS: 0 10*3/uL (ref 0–0.1)
BASOS PCT: 0 %
EOS ABS: 0 10*3/uL (ref 0–0.7)
Eosinophils Relative: 1 %
HEMATOCRIT: 36.3 % — AB (ref 40.0–52.0)
HEMOGLOBIN: 11.6 g/dL — AB (ref 13.0–18.0)
Lymphocytes Relative: 5 %
Lymphs Abs: 0.2 10*3/uL — ABNORMAL LOW (ref 1.0–3.6)
MCH: 28.1 pg (ref 26.0–34.0)
MCHC: 31.8 g/dL — AB (ref 32.0–36.0)
MCV: 88.2 fL (ref 80.0–100.0)
MONOS PCT: 2 %
Monocytes Absolute: 0.1 10*3/uL — ABNORMAL LOW (ref 0.2–1.0)
NEUTROS ABS: 3.6 10*3/uL (ref 1.4–6.5)
NEUTROS PCT: 92 %
Platelets: 67 10*3/uL — ABNORMAL LOW (ref 150–440)
RBC: 4.11 MIL/uL — ABNORMAL LOW (ref 4.40–5.90)
RDW: 14 % (ref 11.5–14.5)
WBC: 3.9 10*3/uL (ref 3.8–10.6)

## 2015-03-06 MED ORDER — SODIUM CHLORIDE 0.9 % IV SOLN
Freq: Once | INTRAVENOUS | Status: AC
  Administered 2015-03-06: 15:00:00 via INTRAVENOUS
  Filled 2015-03-06: qty 1000

## 2015-03-06 MED ORDER — PROCHLORPERAZINE MALEATE 10 MG PO TABS
10.0000 mg | ORAL_TABLET | Freq: Once | ORAL | Status: AC
Start: 2015-03-06 — End: 2015-03-06
  Administered 2015-03-06: 10 mg via ORAL
  Filled 2015-03-06: qty 1

## 2015-03-06 MED ORDER — SODIUM CHLORIDE 0.9 % IV SOLN
600.0000 mg/m2 | Freq: Once | INTRAVENOUS | Status: AC
  Administered 2015-03-06: 1254 mg via INTRAVENOUS
  Filled 2015-03-06: qty 27.72

## 2015-03-06 MED ORDER — HEPARIN SOD (PORK) LOCK FLUSH 100 UNIT/ML IV SOLN
500.0000 [IU] | Freq: Once | INTRAVENOUS | Status: AC | PRN
  Administered 2015-03-06: 500 [IU]
  Filled 2015-03-06: qty 5

## 2015-03-06 MED ORDER — SODIUM CHLORIDE 0.9 % IJ SOLN
10.0000 mL | INTRAMUSCULAR | Status: DC | PRN
  Administered 2015-03-06: 10 mL
  Filled 2015-03-06: qty 10

## 2015-03-06 NOTE — Progress Notes (Signed)
[place] @ [facility] Telephone:([phone]) [phone]  Fax:([phone]) [place]: April 23, 1931  MR#: [id number]  UXN#:[id number]  Patient Care Team: Derinda Late, MD as PCP - General (Family Medicine)  CHIEF COMPLAINT:  Chief Complaint  Patient presents with  . OTHER   Oncology History   The patient is an 81 year old gentleman with stage IV squamous cell carcinoma of the lung who is seen for assessment prior to Nivolumab.  1. Squamous cell carcinoma of lung. Bronchoscopies positive for right lower lobe mass. Started on radiation therapy and chemotherapy.  August, 2012 Carboplatinum (AUC of 2) and Taxol with concurrent radiation therapy 2. Finished chemo /  radiation  in sept 2012. 3. Bronchoscopy was negative for any  malignant cells (August, 2013) 4. Continuing hemoptysis.  Repeat bronchoscopy is positive for malignant cells consistent with squamous cell carcinoma. 5. Palliative radiation and chemotherapy with carboplatinum and Taxol.(October, 2013) has finished radiation therapy and Taxol, carboplatinum in November of 2013 6.veristrat test is good 7.on Tarceva 8.progressing disease on Tarceva.  (January, 2015) discontinue Tarceva. 9.started on the investigational protocol with  [facility] February, 2015 10Patient is off protocol and continuing NIVO as. commercial product 11.  Complete evaluation during July 2 016 including thoracentesis cytology was negative for malignant cells     12.  Progressive disease by PET scan criteria.  Suggested chemotherapy with carboplatinum and gemcitabine starting from November of 2016  Oncology Flowsheet 11/16/2014 11/30/2014 12/14/2014 12/28/2014 01/11/2015 01/26/2015 02/27/2015  Day, Cycle - - - - - - Day 1, Cycle 1  CARBOplatin (PARAPLATIN) IV - - - - - - 400 mg  dexamethasone (DECADRON) IV - - - - - - [ 20 mg ]  gemcitabine (GEMZAR) IV - - - - - - 2,000 mg  INV-nivolumab (BMS [id number]) IV - - - - - - -  nivolumab (OPDIVO) IV 3 mg/kg 3  mg/kg 3 mg/kg 3 mg/kg 3 mg/kg 3 mg/kg -  ondansetron (ZOFRAN) IV - - - - - - [ 16 mg ]   changes.  Was also reviewed with the patient and compared with the previous  INTERVAL HISTORY: 81 year old gentleman with history of recurrent carcinoma of lung\this and has increasing shortness of breath increasing pain.  Had a chest x-ray.  In the interim.  Had thoracentesis only 300 cc of fluid was removed.  Poor appetite. Cough yellowish expectoration Poor appetite and declining performance status Had a recent thoracentesis is 300 cc of fluid was removed there was no improvement in patient's shortness of breath With steroid and antibiotic patient's condition has somewhat improved.  PET scan has been done which has been reviewed independently and reviewed with the patient.  Shows progressive disease  This and was started on carboplatinum and gemcitabine. Patient had tolerated treatment very well.  Platelet count is 67,000 no evidence of bleeding lately patient is not on anticoagulation He does have some problem with tooth which needs to be pulled out  REVIEW OF SYSTEMS:   Gen. status: Feeling weak and tired low-grade fever cardiac: No chest pain no palpitation HEENT: No soreness in the marked no difficulty swallowing.  GI: Nausea no vomiting no diarrhea skin: No rash. Lungs: Increasing cough.  Shortness of breath.  Low-grade fever.  Cough with expectoration. Abdomen: No diarrhea. Musculoskeletal system no bony pains. Diabetes is under well control all the blood sugar values have been reviewed.  Lower extremity no swelling.  Patient does have senile tremors. Appetite is poor Neurological system no  headache or dizziness no other local symptoms.  All other systems have been reviewed  Patient is somewhat more depressed. HEENT: No soreness in the mouth or difficulty swallowing As per HPI. Otherwise, a complete review of systems is negatve.  PAST MEDICAL HISTORY: Past Medical History  Diagnosis Date  .  Hypercholesteremia   . Hypertension   . Diabetes mellitus without complication ([place])   . AAA (abdominal aortic aneurysm) ([place])   . Lung cancer ([place])     09/2010  . Skin cancer   . CAD (coronary artery disease) stent placed  . COPD (chronic obstructive pulmonary disease) ([place])   . Shortness of breath dyspnea   . Pneumonia   . Chronic kidney disease   . GERD (gastroesophageal reflux disease)   . Headache     PAST SURGICAL HISTORY: Past Surgical History  Procedure Laterality Date  . Abdominal aortic aneurysm repair  2000  . Coronary stent placement  2000  . Tonsillectomy      when he was a child    FAMILY HISTORY Family History  Problem Relation Age of Onset  . Cancer Mother   . Cancer Sister   . Cancer Brother   . Cancer Maternal Aunt     Smoking History quit 10 years ago smoked 2 packs a day(1)  PFSH: Comments: family history of breast cancer and melanoma.  Comments: patient had smoked for 40 years one pack per day, quit smoking 10 years ago.  Does not drink  Additional Past Medical and Surgical History: coronary artery disease with stent placement.    Non-insulin-dependent diabetes.    Hypertension.    Multiple skin cancers removed including melanoma.    Aneurysm repair.  Hypercholesteremia     ADVANCED DIRECTIVES: Patient does have advanced healthcare directive   HEALTH MAINTENANCE: Social History  Substance Use Topics  . Smoking status: Former Smoker    Types: Cigarettes, Cigars    Quit date: 04/29/2003  . Smokeless tobacco: None  . Alcohol Use: No       Allergies  Allergen Reactions  . Penicillin G Rash  . Sulfa Antibiotics Rash    Current Outpatient Prescriptions  Medication Sig Dispense Refill  . ADVAIR DISKUS 250-50 MCG/DOSE AEPB INHALE 1 PUFF TWICE A DAY 60 each 0  . benzonatate (TESSALON) 200 MG capsule Take 200 mg by mouth 3 (three) times daily as needed for cough.    . chlorpheniramine-HYDROcodone (TUSSIONEX PENNKINETIC ER) 10-8 MG/5ML  SUER Take 5 mLs by mouth every 12 (twelve) hours as needed for cough. 240 mL 0  . citalopram (CELEXA) 20 MG tablet Take 1 tablet (20 mg total) by mouth daily. 30 tablet 3  . fluticasone (FLONASE) 50 MCG/ACT nasal spray 1 spray. 1 spray in each nostril BID    . furosemide (LASIX) 20 MG tablet Take 1 tablet (20 mg total) by mouth every other day. 30 tablet   . ipratropium-albuterol (DUONEB) 0.5-2.5 (3) MG/3ML SOLN Take 3 mLs by nebulization every 8 (eight) hours as needed.    Marland Kitchen LANTUS SOLOSTAR 100 UNIT/ML Solostar Pen 22 Units. 22 units at bedtime    . metoprolol succinate (TOPROL-XL) 100 MG 24 hr tablet Take 100 mg by mouth daily.    Marland Kitchen omeprazole (PRILOSEC) 40 MG capsule TAKE 1 CAPSULE (40 MG TOTAL) BY MOUTH DAILY. 30 capsule 0  . ondansetron (ZOFRAN) 4 MG tablet Take 1 tablet (4 mg total) by mouth every 6 (six) hours as needed for nausea or vomiting. 30 tablet 3  .  predniSONE (DELTASONE) 10 MG tablet 1 TAB(S) ORALLY ONCE A DAY 30 tablet 3  . PROAIR HFA 108 (90 BASE) MCG/ACT inhaler Inhale 2 puffs into the lungs 4 (four) times daily as needed.    Marland Kitchen SPIRIVA HANDIHALER 18 MCG inhalation capsule INHALE 1 CAPSULE WITH HANDIHALER ONCE A DAY 30 capsule 5  . tamsulosin (FLOMAX) 0.4 MG CAPS capsule Take 0.4 mg by mouth daily.  7  . HUMALOG KWIKPEN 100 UNIT/ML KiwkPen CHECK BLOOD SUGAR BEFORE EACH MEAL. INJECT UNDER THE SKIN PER SLIDING SCALE. MAX 30 U PER DAY  2   No current facility-administered medications for this visit.   Facility-Administered Medications Ordered in Other Visits  Medication Dose Route Frequency Provider Last Rate Last Dose  . heparin lock flush 100 unit/mL  500 Units Intracatheter Once PRN Forest Gleason, MD      . sodium chloride 0.9 % injection 10 mL  10 mL Intracatheter PRN Forest Gleason, MD   10 mL at 09/26/14 1405  . sodium chloride 0.9 % injection 10 mL  10 mL Intracatheter PRN Forest Gleason, MD   10 mL at 02/08/15 1419  . sodium chloride 0.9 % injection 10 mL  10 mL Intracatheter  PRN Forest Gleason, MD   10 mL at 03/06/15 1313    OBJECTIVE:  Filed Vitals:   03/06/15 1346  BP: 129/76  Pulse: 85  Temp: 97 F (36.1 C)     Body mass index is 26.98 kg/(m^2).    ECOG FS:1 - Symptomatic but completely ambulatory  PHYSICAL EXAM: Goal status: Performance status is climbing.  Patient has not lost significant weight HEENT: No evidence of stomatitis.   Sclera and conjunctivae :: No jaundice.   pale looking . Lungs: Crepitation and right base  Dullness on percussion on the right side.  Occasional rhonchi on both sides.  Cardiac: Heart sounds are normal.  No pericardial rub.  No murmur. Lymphatic system: Cervical, axillary, inguinal, lymph nodes not palpable GI: Abdomen is soft.  No ascites.  Liver spleen not palpable.  No tenderness.  Bowel sounds are within normal limit Lower extremity: No edema Neurological system: Higher functions, cranial nerves intact No evidence of peripheral neuropathy. Skin: No rash.  No ecchymosis.. Musculoskeletal system within normal limit    LAB RESULTS:  Infusion on 03/06/2015  Component Date Value Ref Range Status  . WBC 03/06/2015 3.9  3.8 - 10.6 K/uL Final  . RBC 03/06/2015 4.11* 4.40 - 5.90 MIL/uL Final  . Hemoglobin 03/06/2015 11.6* 13.0 - 18.0 g/dL Final  . HCT 03/06/2015 36.3* 40.0 - 52.0 % Final  . MCV 03/06/2015 88.2  80.0 - 100.0 fL Final  . MCH 03/06/2015 28.1  26.0 - 34.0 pg Final  . MCHC 03/06/2015 31.8* 32.0 - 36.0 g/dL Final  . RDW 03/06/2015 14.0  11.5 - 14.5 % Final  . Platelets 03/06/2015 67* 150 - 440 K/uL Final  . Neutrophils Relative % 03/06/2015 92   Final  . Neutro Abs 03/06/2015 3.6  1.4 - 6.5 K/uL Final  . Lymphocytes Relative 03/06/2015 5   Final  . Lymphs Abs 03/06/2015 0.2* 1.0 - 3.6 K/uL Final  . Monocytes Relative 03/06/2015 2   Final  . Monocytes Absolute 03/06/2015 0.1* 0.2 - 1.0 K/uL Final  . Eosinophils Relative 03/06/2015 1   Final  . Eosinophils Absolute 03/06/2015 0.0  0 - 0.7 K/uL Final  .  Basophils Relative 03/06/2015 0   Final  . Basophils Absolute 03/06/2015 0.0  0 - 0.1 K/uL  Final      STUDIES: Dg Chest 2 View  02/08/2015  CLINICAL DATA:  Known malignant CT of the lower lobe of the right lung, now cough, wheezing, shortness of breath, history of COPD. EXAM: CHEST  2 VIEW COMPARISON:  Portable chest x-ray of January 29, 2015 FINDINGS: There remains volume loss on the right with with incomplete re-expansion of the lung. Slightly more fluid is present today. There is stable mild shift of the mediastinum toward the right. The left lung is well-expanded. The interstitial markings remain coarse but have slightly improved. The left heart border is normal. There is mild enlargement of the cardiac silhouette without pulmonary vascular congestion. The Port-A-Cath appliance tip projects over the midportion of the SVC. IMPRESSION: 1. Persistent persistent incompletely incomplete re-expansion of the right lung with multiple areas of loculated air and fluid. A small amount of air in the pleural space representing a tiny volume pneumothorax is present and stable. There is stable soft tissue density capping of the right pulmonary apex. Slightly more right pleural fluid present today than on the previous study. 2. Slight interval improvement in the appearance of the pulmonary interstitium on the left. Electronically Signed   By: David  Martinique M.D.   On: 02/08/2015 14:26   Nm Pet Image Restag (ps) Skull Base To Thigh  02/16/2015  CLINICAL DATA:  Subsequent treatment strategy for lung cancer. Restaging examination. EXAM: NUCLEAR MEDICINE PET SKULL BASE TO THIGH TECHNIQUE: 12.2 mCi F-18 FDG was injected intravenously. Full-ring PET imaging was performed from the skull base to thigh after the radiotracer. CT data was obtained and used for attenuation correction and anatomic localization. FASTING BLOOD GLUCOSE:  Value: 93 mg/dl COMPARISON:  PET-CT 10/25/2014. FINDINGS: NECK No hypermetabolic lymph nodes in  the neck. CHEST Interval enlargement of the previously noted hypervascular mass centered in the medial aspect of the right lower lobe, which is currently a dumbbell-shaped lesion measuring 4.5 x 7.9 cm (image 117 of series 3). The lateral aspect of this lesion remains within the right lower lobe, while the medial aspect has clearly invaded into the left atrium likely a along the course of the right inferior pulmonary vein. This mass encases the bronchus intermedius which is completely obstructed by the mass. There is extensive soft tissue thickening around the right upper lobe bronchus as well, however, this area demonstrates only low-level hypermetabolism. No other mediastinal or left hilar hypermetabolic lymphadenopathy is noted. The right lower lobe appears completely chronically collapsed, as does the right middle lobe. There is extensive septal thickening throughout the right upper lobe, which is likely post treatment related. Small 6 mm pleural-based nodule in the apex of the right upper lobe (image 74 of series 3) demonstrates no hypermetabolism and is similar in retrospect compared to the prior study. Complex pleural fluid and gas collection which appears have multiple internal loculations and multiple air-fluid levels. There is only low-level metabolic activity associated with the right pleural space, suggesting against active infection (i.e., this does not appear to represent an empyema). Heart size is mildly enlarged. Atherosclerotic calcifications in the left anterior descending, left circumflex and right coronary arteries. Several tiny pulmonary nodules in the left lung appear similar to the prior examination and are highly nonspecific measuring up to 4 mm in the left lower lobe (image 91 of series 3). Right internal jugular single-lumen porta cath with tip terminating at the superior cavoatrial junction. ABDOMEN/PELVIS There is at least 1 tiny focus of hypermetabolism (SUVmax = 5.6) in the right lobe  of the liver (segment 4A), where there is a very ill-defined area of low attenuation (image 132 of series 3), highly suspicious for a metastatic lesion. No abnormal hypermetabolic activity within the pancreas, adrenal glands, or spleen. No hypermetabolic lymph nodes in the abdomen or pelvis. Multiple well-defined low-attenuation lesions in the kidneys bilaterally demonstrate no internal hypermetabolism, and although they are not definitively characterized on today's noncontrast CT examination, these are likely cysts, largest of which measures 2.9 cm in the upper pole of the right kidney. Atherosclerosis throughout the abdominal and pelvic vasculature, with fusiform infrarenal abdominal aortic aneurysm measuring 3.6 x 3.4 cm (unchanged) and postoperative changes of aorto bi-iliac bypass graft placement. Normal appendix. Numerous colonic diverticuli are noted, without surrounding inflammatory changes to suggest an acute diverticulitis at this time. No significant volume of ascites. No pneumoperitoneum. SKELETON Numerous foci of skeletal hypermetabolism, which are most commonly seen in the pelvis. Many of these are occult on CT images, but some are slightly lidocaine appearance, including an 8 mm lesion in the central aspect of the sacrum (image 223 of series 3) which is hypermetabolic (SUVmax = 5.8). IMPRESSION: 1. Today's study demonstrates progression of disease as evidenced by interval enlargement of the right lower lobe mass which is clearly invading the left atrium of the heart via the right inferior pulmonary vein. There is some low-level metabolic activity throughout the right pleural space, which may indicate a malignant effusion, or may simply be a related to the patient's complex right-sided hydropneumothorax and associated inflammation) no overt findings to strongly suggest empyema at this time, however, sampling of the pleural fluid is suggested). 2. 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 3. Additional incidental findings, as above. Electronically Signed   By: Vinnie Langton M.D.   On: 02/16/2015 11:02    ASSESSMENT: Recurrent squamous cell carcinoma of right lower lobe. Patient recently had thoracentesis only 300 cc of fluid was removed there was no improvement in shortness of breath Repeat chest x-ray shows persistent right lower lobe consolidation progressing mass Possibility of PET scan Hold NIVOLULAMAB probably progressing disease on NIVOLULAMAB Start patient on prednisone tapering doses with Levaquin Consider PDL for [place]:  PET scan has been reviewed independently and shows progressive disease in the lung mass is invading left atrium.  There are new areas of bone metastases.  There are also abnormal areas in the liver Lung cancer  All lab data has been reviewed Thrombocytopenia without any evidence of bleeding most likely secondary to gemcitabine Patient is not on any anticoagulation patient was advised not to get any tooth extraction unless platelet count improves Dose of gemcitabine would be reduced to 600 mg/m and from next chemotherapy onwards it would be given 800 mg/m Family was here to discuss all the options of treatment    Staging form: Lung, AJCC 7th Edition     Clinical: Stage IV (T3, N2, M1b) - Signed by Curt Bears, MD on 05/25/2013     Pathologic: No stage assigned - Marni Griffon, MD   03/06/2015 1:58 PM

## 2015-03-06 NOTE — Progress Notes (Signed)
Patient complains that he has a tooth that needs extracted and wants to know if it is okay.

## 2015-03-12 ENCOUNTER — Inpatient Hospital Stay: Payer: Medicare Other

## 2015-03-12 ENCOUNTER — Telehealth: Payer: Self-pay | Admitting: *Deleted

## 2015-03-12 ENCOUNTER — Other Ambulatory Visit: Payer: Self-pay

## 2015-03-12 ENCOUNTER — Inpatient Hospital Stay (HOSPITAL_BASED_OUTPATIENT_CLINIC_OR_DEPARTMENT_OTHER): Payer: Medicare Other | Admitting: Oncology

## 2015-03-12 ENCOUNTER — Encounter: Payer: Self-pay | Admitting: Oncology

## 2015-03-12 VITALS — BP 99/62 | HR 98 | Temp 96.7°F | Wt 190.3 lb

## 2015-03-12 DIAGNOSIS — N189 Chronic kidney disease, unspecified: Secondary | ICD-10-CM

## 2015-03-12 DIAGNOSIS — R531 Weakness: Secondary | ICD-10-CM

## 2015-03-12 DIAGNOSIS — R05 Cough: Secondary | ICD-10-CM

## 2015-03-12 DIAGNOSIS — J449 Chronic obstructive pulmonary disease, unspecified: Secondary | ICD-10-CM

## 2015-03-12 DIAGNOSIS — C787 Secondary malignant neoplasm of liver and intrahepatic bile duct: Secondary | ICD-10-CM

## 2015-03-12 DIAGNOSIS — Z85828 Personal history of other malignant neoplasm of skin: Secondary | ICD-10-CM

## 2015-03-12 DIAGNOSIS — Z79899 Other long term (current) drug therapy: Secondary | ICD-10-CM

## 2015-03-12 DIAGNOSIS — F329 Major depressive disorder, single episode, unspecified: Secondary | ICD-10-CM

## 2015-03-12 DIAGNOSIS — C3431 Malignant neoplasm of lower lobe, right bronchus or lung: Secondary | ICD-10-CM | POA: Diagnosis not present

## 2015-03-12 DIAGNOSIS — Z7952 Long term (current) use of systemic steroids: Secondary | ICD-10-CM

## 2015-03-12 DIAGNOSIS — I2510 Atherosclerotic heart disease of native coronary artery without angina pectoris: Secondary | ICD-10-CM

## 2015-03-12 DIAGNOSIS — R21 Rash and other nonspecific skin eruption: Secondary | ICD-10-CM

## 2015-03-12 DIAGNOSIS — I714 Abdominal aortic aneurysm, without rupture: Secondary | ICD-10-CM

## 2015-03-12 DIAGNOSIS — E7800 Pure hypercholesterolemia, unspecified: Secondary | ICD-10-CM

## 2015-03-12 DIAGNOSIS — R0602 Shortness of breath: Secondary | ICD-10-CM

## 2015-03-12 DIAGNOSIS — E119 Type 2 diabetes mellitus without complications: Secondary | ICD-10-CM

## 2015-03-12 DIAGNOSIS — I129 Hypertensive chronic kidney disease with stage 1 through stage 4 chronic kidney disease, or unspecified chronic kidney disease: Secondary | ICD-10-CM

## 2015-03-12 DIAGNOSIS — Z87891 Personal history of nicotine dependence: Secondary | ICD-10-CM

## 2015-03-12 DIAGNOSIS — K219 Gastro-esophageal reflux disease without esophagitis: Secondary | ICD-10-CM

## 2015-03-12 DIAGNOSIS — C7951 Secondary malignant neoplasm of bone: Secondary | ICD-10-CM

## 2015-03-12 DIAGNOSIS — R509 Fever, unspecified: Secondary | ICD-10-CM

## 2015-03-12 DIAGNOSIS — R630 Anorexia: Secondary | ICD-10-CM

## 2015-03-12 DIAGNOSIS — Z5111 Encounter for antineoplastic chemotherapy: Secondary | ICD-10-CM | POA: Diagnosis not present

## 2015-03-12 DIAGNOSIS — C3490 Malignant neoplasm of unspecified part of unspecified bronchus or lung: Secondary | ICD-10-CM

## 2015-03-12 DIAGNOSIS — Z923 Personal history of irradiation: Secondary | ICD-10-CM

## 2015-03-12 DIAGNOSIS — Z794 Long term (current) use of insulin: Secondary | ICD-10-CM

## 2015-03-12 LAB — CBC WITH DIFFERENTIAL/PLATELET
BASOS ABS: 0 10*3/uL (ref 0–0.1)
Basophils Relative: 1 %
Eosinophils Absolute: 0 10*3/uL (ref 0–0.7)
HEMATOCRIT: 35.4 % — AB (ref 40.0–52.0)
Hemoglobin: 11.2 g/dL — ABNORMAL LOW (ref 13.0–18.0)
LYMPHS ABS: 0.1 10*3/uL — AB (ref 1.0–3.6)
Lymphocytes Relative: 19 %
MCH: 27.8 pg (ref 26.0–34.0)
MCHC: 31.8 g/dL — ABNORMAL LOW (ref 32.0–36.0)
MCV: 87.6 fL (ref 80.0–100.0)
MONO ABS: 0 10*3/uL — AB (ref 0.2–1.0)
Monocytes Relative: 1 %
NEUTROS ABS: 0.6 10*3/uL — AB (ref 1.4–6.5)
Neutrophils Relative %: 77 %
PLATELETS: 31 10*3/uL — AB (ref 150–440)
RBC: 4.04 MIL/uL — AB (ref 4.40–5.90)
RDW: 14.2 % (ref 11.5–14.5)
WBC: 0.8 10*3/uL — AB (ref 3.8–10.6)

## 2015-03-12 MED ORDER — HYDROCOD POLST-CPM POLST ER 10-8 MG/5ML PO SUER: 5.0000 mL | Freq: Two times a day (BID) | ORAL | Status: DC | PRN

## 2015-03-12 MED ORDER — PREDNISONE 10 MG PO TABS: ORAL_TABLET | ORAL | Status: DC

## 2015-03-12 NOTE — Telephone Encounter (Signed)
Has a progressive rash since yesterday and this morning he has whelps. Will come in at to be seen per request of Dr Oliva Bustard. States will be here at 10

## 2015-03-12 NOTE — Progress Notes (Signed)
[place] @ [facility] [phone]  Fax:([phone]) Leroy: 02-16-1931  MR#: [id number]  HKV#:[id number]  Patient Care Team: Derinda Late, MD as PCP - General (Family Medicine)  CHIEF COMPLAINT:  Chief Complaint  Patient presents with  . Acute Visit   Oncology History   The patient is an 81 year old gentleman with stage IV squamous cell carcinoma of the lung who is seen for assessment prior to Nivolumab.  1. Squamous cell carcinoma of lung. Bronchoscopies positive for right lower lobe mass. Started on radiation therapy and chemotherapy.  August, 2012 Carboplatinum (AUC of 2) and Taxol with concurrent radiation therapy 2. Finished chemo /  radiation  in sept 2012. 3. Bronchoscopy was negative for any  malignant cells (August, 2013) 4. Continuing hemoptysis.  Repeat bronchoscopy is positive for malignant cells consistent with squamous cell carcinoma. 5. Palliative radiation and chemotherapy with carboplatinum and Taxol.(October, 2013) has finished radiation therapy and Taxol, carboplatinum in November of 2013 6.veristrat test is good 7.on Tarceva 8.progressing disease on Tarceva.  (January, 2015) discontinue Tarceva. 9.started on the investigational protocol with  [facility] February, 2015 10Patient is off protocol and continuing NIVO as. commercial product 11.  Complete evaluation during July 2 016 including thoracentesis cytology was negative for malignant cells     12.  Progressive disease by PET scan criteria.  Suggested chemotherapy with carboplatinum and gemcitabine starting from November of 2016  Oncology Flowsheet 11/30/2014 12/14/2014 12/28/2014 01/11/2015 01/26/2015 02/27/2015 03/06/2015  Day, Cycle - - - - - Day 1, Cycle 1 Day 8, Cycle 1  CARBOplatin (PARAPLATIN) IV - - - - - 400 mg -  dexamethasone (DECADRON) IV - - - - - [ 20 mg ] -  gemcitabine (GEMZAR) IV - - - - - 2,000 mg 600 mg/m2  INV-nivolumab (BMS [id number]) IV - - - - - - -    nivolumab (OPDIVO) IV 3 mg/kg 3 mg/kg 3 mg/kg 3 mg/kg 3 mg/kg - -  ondansetron (ZOFRAN) IV - - - - - [ 16 mg ] -  prochlorperazine (COMPAZINE) PO - - - - - - 10 mg   changes.  Was also reviewed with the patient and compared with the previous  INTERVAL HISTORY: 81 year old gentleman with history of recurrent carcinoma of lung\this and has increasing shortness of breath increasing pain.    Patient had received the last dose of gemcitabine last week.  Patient developed extensive rash maculopapular  Mainly in the abdominal area on the back and upper extremity.  No soreness in the mouth.  No diarrhea.  No fever.  There is itching   REVIEW OF SYSTEMS:   Gen. status: Feeling weak and tired low-grade fever cardiac: No chest pain no palpitation HEENT: No soreness in the marked no difficulty swallowing.  GI: Nausea no vomiting no diarrhea skin: No rash. Lungs: Increasing cough.  Shortness of breath.  Low-grade fever.  Cough with expectoration. Abdomen: No diarrhea. Musculoskeletal system no bony pains. Diabetes is under well control all the blood sugar values have been reviewed.  Lower extremity no swelling.  Patient does have senile tremors. Appetite is poor Neurological system no headache or dizziness no other local symptoms.  All other systems have been reviewed  Patient is somewhat more depressed. HEENT: No soreness in the mouth or difficulty swallowing As per HPI. Otherwise, a complete review of systems is negatve.  PAST MEDICAL HISTORY: Past Medical History  Diagnosis Date  . Hypercholesteremia   . Hypertension   .  Diabetes mellitus without complication ([place])   . AAA (abdominal aortic aneurysm) ([place])   . Lung cancer ([place])     09/2010  . Skin cancer   . CAD (coronary artery disease) stent placed  . COPD (chronic obstructive pulmonary disease) ([place])   . Shortness of breath dyspnea   . Pneumonia   . Chronic kidney disease   . GERD (gastroesophageal reflux disease)   . Headache      PAST SURGICAL HISTORY: Past Surgical History  Procedure Laterality Date  . Abdominal aortic aneurysm repair  2000  . Coronary stent placement  2000  . Tonsillectomy      when he was a child    FAMILY HISTORY Family History  Problem Relation Age of Onset  . Cancer Mother   . Cancer Sister   . Cancer Brother   . Cancer Maternal Aunt     Smoking History quit 10 years ago smoked 2 packs a day(1)  PFSH: Comments: family history of breast cancer and melanoma.  Comments: patient had smoked for 40 years one pack per day, quit smoking 10 years ago.  Does not drink  Additional Past Medical and Surgical History: coronary artery disease with stent placement.    Non-insulin-dependent diabetes.    Hypertension.    Multiple skin cancers removed including melanoma.    Aneurysm repair.  Hypercholesteremia     ADVANCED DIRECTIVES: Patient does have advanced healthcare directive   HEALTH MAINTENANCE: Social History  Substance Use Topics  . Smoking status: Former Smoker    Types: Cigarettes, Cigars    Quit date: 04/29/2003  . Smokeless tobacco: None  . Alcohol Use: No       Allergies  Allergen Reactions  . Penicillin G Rash  . Sulfa Antibiotics Rash    Current Outpatient Prescriptions  Medication Sig Dispense Refill  . ADVAIR DISKUS 250-50 MCG/DOSE AEPB INHALE 1 PUFF TWICE A DAY 60 each 0  . benzonatate (TESSALON) 200 MG capsule Take 200 mg by mouth 3 (three) times daily as needed for cough.    . chlorpheniramine-HYDROcodone (TUSSIONEX PENNKINETIC ER) 10-8 MG/5ML SUER Take 5 mLs by mouth every 12 (twelve) hours as needed for cough. 240 mL 0  . citalopram (CELEXA) 20 MG tablet Take 1 tablet (20 mg total) by mouth daily. 30 tablet 3  . fluticasone (FLONASE) 50 MCG/ACT nasal spray 1 spray. 1 spray in each nostril BID    . furosemide (LASIX) 20 MG tablet Take 1 tablet (20 mg total) by mouth every other day. 30 tablet   . HUMALOG KWIKPEN 100 UNIT/ML KiwkPen CHECK BLOOD SUGAR  BEFORE EACH MEAL. INJECT UNDER THE SKIN PER SLIDING SCALE. MAX 30 U PER DAY  2  . ipratropium-albuterol (DUONEB) 0.5-2.5 (3) MG/3ML SOLN Take 3 mLs by nebulization every 8 (eight) hours as needed.    Marland Kitchen LANTUS SOLOSTAR 100 UNIT/ML Solostar Pen 22 Units. 22 units at bedtime    . metoprolol succinate (TOPROL-XL) 100 MG 24 hr tablet Take 100 mg by mouth daily.    Marland Kitchen omeprazole (PRILOSEC) 40 MG capsule TAKE 1 CAPSULE (40 MG TOTAL) BY MOUTH DAILY. 30 capsule 0  . ondansetron (ZOFRAN) 4 MG tablet Take 1 tablet (4 mg total) by mouth every 6 (six) hours as needed for nausea or vomiting. 30 tablet 3  . predniSONE (DELTASONE) 10 MG tablet 1 TAB(S) ORALLY ONCE A DAY 30 tablet 3  . PROAIR HFA 108 (90 BASE) MCG/ACT inhaler Inhale 2 puffs into the lungs 4 (four) times daily  as needed.    Marland Kitchen SPIRIVA HANDIHALER 18 MCG inhalation capsule INHALE 1 CAPSULE WITH HANDIHALER ONCE A DAY 30 capsule 5  . tamsulosin (FLOMAX) 0.4 MG CAPS capsule Take 0.4 mg by mouth daily.  7  . predniSONE (DELTASONE) 10 MG tablet Start '50mg'$  tapers by '10mg'$  daily to 0. 15 tablet 0   No current facility-administered medications for this visit.   Facility-Administered Medications Ordered in Other Visits  Medication Dose Route Frequency Provider Last Rate Last Dose  . sodium chloride 0.9 % injection 10 mL  10 mL Intracatheter PRN Forest Gleason, MD   10 mL at 09/26/14 1405  . sodium chloride 0.9 % injection 10 mL  10 mL Intracatheter PRN Forest Gleason, MD   10 mL at 02/08/15 1419    OBJECTIVE:  Filed Vitals:   03/12/15 1048  BP: 99/62  Pulse: 98  Temp: 96.7 F (35.9 C)     Body mass index is 27.3 kg/(m^2).    ECOG FS:1 - Symptomatic but completely ambulatory  PHYSICAL EXAM: Goal status: Performance status is climbing.  Patient has not lost significant weight HEENT: No evidence of stomatitis.    Sclera and conjunctivae :: No jaundice.   pale looking . Lungs: Crepitation and right base  Dullness on percussion on the right side.   Occasional rhonchi on both sides.  Cardiac: Heart sounds are normal.  No pericardial rub.  No murmur. Lymphatic system: Cervical, axillary, inguinal, lymph nodes not palpable GI: Abdomen is soft.  No ascites.  Liver spleen not palpable.  No tenderness.  Bowel sounds are within normal limit Lower extremity: No edema Neurological system: Higher functions, cranial nerves intact No evidence of peripheral neuropathy. : Musculoskeletal system within normal limit  Skin: Maculopapular rash all over the body. (sparing lower extremity)   LAB RESULTS:  No visits with results within 3 Day(s) from this visit. Latest known visit with results is:  Infusion on 03/06/2015  Component Date Value Ref Range Status  . WBC 03/06/2015 3.9  3.8 - 10.6 K/uL Final   A-LINE DRAW  . RBC 03/06/2015 4.11* 4.40 - 5.90 MIL/uL Final  . Hemoglobin 03/06/2015 11.6* 13.0 - 18.0 g/dL Final  . HCT 03/06/2015 36.3* 40.0 - 52.0 % Final  . MCV 03/06/2015 88.2  80.0 - 100.0 fL Final  . MCH 03/06/2015 28.1  26.0 - 34.0 pg Final  . MCHC 03/06/2015 31.8* 32.0 - 36.0 g/dL Final  . RDW 03/06/2015 14.0  11.5 - 14.5 % Final  . Platelets 03/06/2015 67* 150 - 440 K/uL Final  . Neutrophils Relative % 03/06/2015 92   Final  . Neutro Abs 03/06/2015 3.6  1.4 - 6.5 K/uL Final  . Lymphocytes Relative 03/06/2015 5   Final  . Lymphs Abs 03/06/2015 0.2* 1.0 - 3.6 K/uL Final  . Monocytes Relative 03/06/2015 2   Final  . Monocytes Absolute 03/06/2015 0.1* 0.2 - 1.0 K/uL Final  . Eosinophils Relative 03/06/2015 1   Final  . Eosinophils Absolute 03/06/2015 0.0  0 - 0.7 K/uL Final  . Basophils Relative 03/06/2015 0   Final  . Basophils Absolute 03/06/2015 0.0  0 - 0.1 K/uL Final      STUDIES: Nm Pet Image Restag (ps) Skull Base To Thigh  02/16/2015  CLINICAL DATA:  Subsequent treatment strategy for lung cancer. Restaging examination. EXAM: NUCLEAR MEDICINE PET SKULL BASE TO THIGH TECHNIQUE: 12.2 mCi F-18 FDG was injected intravenously.  Full-ring PET imaging was performed from the skull base to thigh after the  radiotracer. CT data was obtained and used for attenuation correction and anatomic localization. FASTING BLOOD GLUCOSE:  Value: 93 mg/dl COMPARISON:  PET-CT 10/25/2014. FINDINGS: NECK No hypermetabolic lymph nodes in the neck. CHEST Interval enlargement of the previously noted hypervascular mass centered in the medial aspect of the right lower lobe, which is currently a dumbbell-shaped lesion measuring 4.5 x 7.9 cm (image 117 of series 3). The lateral aspect of this lesion remains within the right lower lobe, while the medial aspect has clearly invaded into the left atrium likely a along the course of the right inferior pulmonary vein. This mass encases the bronchus intermedius which is completely obstructed by the mass. There is extensive soft tissue thickening around the right upper lobe bronchus as well, however, this area demonstrates only low-level hypermetabolism. No other mediastinal or left hilar hypermetabolic lymphadenopathy is noted. The right lower lobe appears completely chronically collapsed, as does the right middle lobe. There is extensive septal thickening throughout the right upper lobe, which is likely post treatment related. Small 6 mm pleural-based nodule in the apex of the right upper lobe (image 74 of series 3) demonstrates no hypermetabolism and is similar in retrospect compared to the prior study. Complex pleural fluid and gas collection which appears have multiple internal loculations and multiple air-fluid levels. There is only low-level metabolic activity associated with the right pleural space, suggesting against active infection (i.e., this does not appear to represent an empyema). Heart size is mildly enlarged. Atherosclerotic calcifications in the left anterior descending, left circumflex and right coronary arteries. Several tiny pulmonary nodules in the left lung appear similar to the prior examination and are  highly nonspecific measuring up to 4 mm in the left lower lobe (image 91 of series 3). Right internal jugular single-lumen porta cath with tip terminating at the superior cavoatrial junction. ABDOMEN/PELVIS There is at least 1 tiny focus of hypermetabolism (SUVmax = 5.6) in the right lobe of the liver (segment 4A), where there is a very ill-defined area of low attenuation (image 132 of series 3), highly suspicious for a metastatic lesion. No abnormal hypermetabolic activity within the pancreas, adrenal glands, or spleen. No hypermetabolic lymph nodes in the abdomen or pelvis. Multiple well-defined low-attenuation lesions in the kidneys bilaterally demonstrate no internal hypermetabolism, and although they are not definitively characterized on today's noncontrast CT examination, these are likely cysts, largest of which measures 2.9 cm in the upper pole of the right kidney. Atherosclerosis throughout the abdominal and pelvic vasculature, with fusiform infrarenal abdominal aortic aneurysm measuring 3.6 x 3.4 cm (unchanged) and postoperative changes of aorto bi-iliac bypass graft placement. Normal appendix. Numerous colonic diverticuli are noted, without surrounding inflammatory changes to suggest an acute diverticulitis at this time. No significant volume of ascites. No pneumoperitoneum. SKELETON Numerous foci of skeletal hypermetabolism, which are most commonly seen in the pelvis. Many of these are occult on CT images, but some are slightly lidocaine appearance, including an 8 mm lesion in the central aspect of the sacrum (image 223 of series 3) which is hypermetabolic (SUVmax = 5.8). IMPRESSION: 1. Today's study demonstrates progression of disease as evidenced by interval enlargement of the right lower lobe mass which is clearly invading the left atrium of the heart via the right inferior pulmonary vein. There is some low-level metabolic activity throughout the right pleural space, which may indicate a malignant  effusion, or may simply be a related to the patient's complex right-sided hydropneumothorax and associated inflammation) no overt findings to strongly suggest empyema at  this time, however, sampling of the pleural fluid is suggested). 2. 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 3. Additional incidental findings, as above. Electronically Signed   By: Vinnie Langton M.D.   On: 02/16/2015 11:02    ASSESSMENT: Recurrent squamous cell carcinoma of right lower lobe. Patient recently had thoracentesis only 300 cc of fluid was removed there was no improvement in shortness of breath  acute add-on for evaluation regarding maculopapular rash   We started prednisone on 50 mg tapering by 10   To  10 mg daily.  Patient was instructed to add just insulin dose accordingly.   most likely rash is secondary to gemcitabine  we may have to change treatment option Consider PDL for [place]:   patient was instructed to call me if her rash does not get better in next 2 days   CBC would be done and all lab data would be checked as soon as available   Staging form: Lung, AJCC 7th Edition     Clinical: Stage IV (T3, N2, M1b) - Signed by Curt Bears, MD on 05/25/2013     Pathologic: No stage assigned - Marni Griffon, MD   03/12/2015 11:12 AM

## 2015-03-12 NOTE — Progress Notes (Signed)
Patient acute add on for rash that developed Friday evening and has worsened.

## 2015-03-13 ENCOUNTER — Telehealth: Payer: Self-pay | Admitting: *Deleted

## 2015-03-13 ENCOUNTER — Inpatient Hospital Stay: Payer: Medicare Other

## 2015-03-13 MED ORDER — LEVOFLOXACIN 500 MG PO TABS: 500.0000 mg | ORAL_TABLET | Freq: Every day | ORAL | Status: DC

## 2015-03-13 NOTE — Telephone Encounter (Signed)
Patient has low WBC due to chemotherapy. Per MD, will start levaquin '500mg'$  daily x 5 days. Will recheck labs on Thursday to make sure blood counts are improving.

## 2015-03-13 NOTE — Telephone Encounter (Signed)
Called and spoke to patient's wife to inform them that patient's WBC is low due to chemo.  Levaquin 500 mg daily for 5 days has been called to pharmacy.  We will recheck his labs on Thursday.  Verbalized understanding.

## 2015-03-14 ENCOUNTER — Other Ambulatory Visit: Payer: Self-pay | Admitting: Oncology

## 2015-03-15 ENCOUNTER — Inpatient Hospital Stay: Payer: Medicare Other

## 2015-03-15 ENCOUNTER — Other Ambulatory Visit: Payer: Self-pay | Admitting: Oncology

## 2015-03-15 DIAGNOSIS — Z5111 Encounter for antineoplastic chemotherapy: Secondary | ICD-10-CM | POA: Diagnosis not present

## 2015-03-15 DIAGNOSIS — C3431 Malignant neoplasm of lower lobe, right bronchus or lung: Secondary | ICD-10-CM

## 2015-03-15 LAB — CBC WITH DIFFERENTIAL/PLATELET
Basophils Absolute: 0 10*3/uL (ref 0–0.1)
Basophils Relative: 0 %
EOS ABS: 0 10*3/uL (ref 0–0.7)
Eosinophils Relative: 1 %
HCT: 33.5 % — ABNORMAL LOW (ref 40.0–52.0)
HEMOGLOBIN: 10.8 g/dL — AB (ref 13.0–18.0)
LYMPHS ABS: 0.3 10*3/uL — AB (ref 1.0–3.6)
MCH: 28.1 pg (ref 26.0–34.0)
MCHC: 32.1 g/dL (ref 32.0–36.0)
MCV: 87.3 fL (ref 80.0–100.0)
Monocytes Absolute: 0.1 10*3/uL — ABNORMAL LOW (ref 0.2–1.0)
Monocytes Relative: 7 %
NEUTROS ABS: 1.2 10*3/uL — AB (ref 1.4–6.5)
PLATELETS: 30 10*3/uL — AB (ref 150–440)
RBC: 3.84 MIL/uL — AB (ref 4.40–5.90)
RDW: 13.9 % (ref 11.5–14.5)
WBC: 1.7 10*3/uL — AB (ref 3.8–10.6)

## 2015-03-20 ENCOUNTER — Inpatient Hospital Stay: Payer: Medicare Other

## 2015-03-20 ENCOUNTER — Other Ambulatory Visit: Payer: Self-pay | Admitting: Family Medicine

## 2015-03-20 DIAGNOSIS — C3431 Malignant neoplasm of lower lobe, right bronchus or lung: Secondary | ICD-10-CM

## 2015-03-20 DIAGNOSIS — Z5111 Encounter for antineoplastic chemotherapy: Secondary | ICD-10-CM | POA: Diagnosis not present

## 2015-03-20 LAB — CBC WITH DIFFERENTIAL/PLATELET
BASOS PCT: 0 %
Basophils Absolute: 0 10*3/uL (ref 0–0.1)
EOS PCT: 1 %
Eosinophils Absolute: 0 10*3/uL (ref 0–0.7)
HCT: 34.8 % — ABNORMAL LOW (ref 40.0–52.0)
Hemoglobin: 11.1 g/dL — ABNORMAL LOW (ref 13.0–18.0)
LYMPHS ABS: 0.3 10*3/uL — AB (ref 1.0–3.6)
Lymphocytes Relative: 9 %
MCH: 28.1 pg (ref 26.0–34.0)
MCHC: 32 g/dL (ref 32.0–36.0)
MCV: 87.6 fL (ref 80.0–100.0)
MONO ABS: 1.2 10*3/uL — AB (ref 0.2–1.0)
Monocytes Relative: 35 %
NEUTROS ABS: 1.9 10*3/uL (ref 1.4–6.5)
Neutrophils Relative %: 55 %
PLATELETS: 298 10*3/uL (ref 150–440)
RBC: 3.97 MIL/uL — ABNORMAL LOW (ref 4.40–5.90)
RDW: 14.2 % (ref 11.5–14.5)
WBC: 3.4 10*3/uL — ABNORMAL LOW (ref 3.8–10.6)

## 2015-03-27 ENCOUNTER — Inpatient Hospital Stay: Payer: Medicare Other

## 2015-03-27 ENCOUNTER — Inpatient Hospital Stay (HOSPITAL_BASED_OUTPATIENT_CLINIC_OR_DEPARTMENT_OTHER): Payer: Medicare Other | Admitting: Oncology

## 2015-03-27 ENCOUNTER — Encounter: Payer: Self-pay | Admitting: Oncology

## 2015-03-27 VITALS — BP 130/78 | HR 96 | Temp 97.5°F | Wt 191.8 lb

## 2015-03-27 DIAGNOSIS — I10 Essential (primary) hypertension: Secondary | ICD-10-CM

## 2015-03-27 DIAGNOSIS — K219 Gastro-esophageal reflux disease without esophagitis: Secondary | ICD-10-CM

## 2015-03-27 DIAGNOSIS — Z794 Long term (current) use of insulin: Secondary | ICD-10-CM

## 2015-03-27 DIAGNOSIS — F329 Major depressive disorder, single episode, unspecified: Secondary | ICD-10-CM

## 2015-03-27 DIAGNOSIS — I2510 Atherosclerotic heart disease of native coronary artery without angina pectoris: Secondary | ICD-10-CM

## 2015-03-27 DIAGNOSIS — E7800 Pure hypercholesterolemia, unspecified: Secondary | ICD-10-CM

## 2015-03-27 DIAGNOSIS — Z79899 Other long term (current) drug therapy: Secondary | ICD-10-CM

## 2015-03-27 DIAGNOSIS — I714 Abdominal aortic aneurysm, without rupture: Secondary | ICD-10-CM

## 2015-03-27 DIAGNOSIS — E119 Type 2 diabetes mellitus without complications: Secondary | ICD-10-CM

## 2015-03-27 DIAGNOSIS — Z5111 Encounter for antineoplastic chemotherapy: Secondary | ICD-10-CM | POA: Diagnosis not present

## 2015-03-27 DIAGNOSIS — R0602 Shortness of breath: Secondary | ICD-10-CM

## 2015-03-27 DIAGNOSIS — K2100 Gastro-esophageal reflux disease with esophagitis, without bleeding: Secondary | ICD-10-CM

## 2015-03-27 DIAGNOSIS — C3431 Malignant neoplasm of lower lobe, right bronchus or lung: Secondary | ICD-10-CM | POA: Diagnosis not present

## 2015-03-27 DIAGNOSIS — R630 Anorexia: Secondary | ICD-10-CM

## 2015-03-27 DIAGNOSIS — R21 Rash and other nonspecific skin eruption: Secondary | ICD-10-CM | POA: Diagnosis not present

## 2015-03-27 DIAGNOSIS — Z85828 Personal history of other malignant neoplasm of skin: Secondary | ICD-10-CM

## 2015-03-27 DIAGNOSIS — J449 Chronic obstructive pulmonary disease, unspecified: Secondary | ICD-10-CM

## 2015-03-27 DIAGNOSIS — Z7952 Long term (current) use of systemic steroids: Secondary | ICD-10-CM

## 2015-03-27 DIAGNOSIS — C342 Malignant neoplasm of middle lobe, bronchus or lung: Secondary | ICD-10-CM

## 2015-03-27 DIAGNOSIS — I25810 Atherosclerosis of coronary artery bypass graft(s) without angina pectoris: Secondary | ICD-10-CM

## 2015-03-27 DIAGNOSIS — C7951 Secondary malignant neoplasm of bone: Secondary | ICD-10-CM | POA: Diagnosis not present

## 2015-03-27 DIAGNOSIS — N189 Chronic kidney disease, unspecified: Secondary | ICD-10-CM

## 2015-03-27 DIAGNOSIS — R509 Fever, unspecified: Secondary | ICD-10-CM

## 2015-03-27 DIAGNOSIS — C787 Secondary malignant neoplasm of liver and intrahepatic bile duct: Secondary | ICD-10-CM

## 2015-03-27 DIAGNOSIS — Z95828 Presence of other vascular implants and grafts: Secondary | ICD-10-CM

## 2015-03-27 DIAGNOSIS — R05 Cough: Secondary | ICD-10-CM

## 2015-03-27 DIAGNOSIS — Z87891 Personal history of nicotine dependence: Secondary | ICD-10-CM

## 2015-03-27 DIAGNOSIS — R531 Weakness: Secondary | ICD-10-CM

## 2015-03-27 DIAGNOSIS — Z923 Personal history of irradiation: Secondary | ICD-10-CM

## 2015-03-27 DIAGNOSIS — I129 Hypertensive chronic kidney disease with stage 1 through stage 4 chronic kidney disease, or unspecified chronic kidney disease: Secondary | ICD-10-CM

## 2015-03-27 LAB — CBC WITH DIFFERENTIAL/PLATELET
BASOS ABS: 0 10*3/uL (ref 0–0.1)
Band Neutrophils: 9 %
Basophils Relative: 0 %
EOS ABS: 0 10*3/uL (ref 0–0.7)
Eosinophils Relative: 0 %
HCT: 32.2 % — ABNORMAL LOW (ref 40.0–52.0)
Hemoglobin: 10.3 g/dL — ABNORMAL LOW (ref 13.0–18.0)
LYMPHS ABS: 0.3 10*3/uL — AB (ref 1.0–3.6)
LYMPHS PCT: 2 %
MCH: 28.1 pg (ref 26.0–34.0)
MCHC: 31.8 g/dL — ABNORMAL LOW (ref 32.0–36.0)
MCV: 88.3 fL (ref 80.0–100.0)
MONOS PCT: 20 %
Metamyelocytes Relative: 4 %
Monocytes Absolute: 3 10*3/uL — ABNORMAL HIGH (ref 0.2–1.0)
Myelocytes: 4 %
NEUTROS PCT: 61 %
Neutro Abs: 11.7 10*3/uL — ABNORMAL HIGH (ref 1.4–6.5)
PLATELETS: 330 10*3/uL (ref 150–440)
RBC: 3.65 MIL/uL — AB (ref 4.40–5.90)
RDW: 14.8 % — AB (ref 11.5–14.5)
WBC: 15 10*3/uL — AB (ref 3.8–10.6)

## 2015-03-27 LAB — COMPREHENSIVE METABOLIC PANEL
ALT: 19 U/L (ref 17–63)
AST: 15 U/L (ref 15–41)
Albumin: 3.2 g/dL — ABNORMAL LOW (ref 3.5–5.0)
Alkaline Phosphatase: 55 U/L (ref 38–126)
Anion gap: 8 (ref 5–15)
BUN: 25 mg/dL — ABNORMAL HIGH (ref 6–20)
CHLORIDE: 100 mmol/L — AB (ref 101–111)
CO2: 30 mmol/L (ref 22–32)
CREATININE: 1.39 mg/dL — AB (ref 0.61–1.24)
Calcium: 8.6 mg/dL — ABNORMAL LOW (ref 8.9–10.3)
GFR calc non Af Amer: 46 mL/min — ABNORMAL LOW (ref >60)
GFR, EST AFRICAN AMERICAN: 53 mL/min — AB (ref >60)
Glucose, Bld: 145 mg/dL — ABNORMAL HIGH (ref 65–99)
POTASSIUM: 3.5 mmol/L (ref 3.5–5.1)
SODIUM: 138 mmol/L (ref 135–145)
Total Bilirubin: 0.5 mg/dL (ref 0.3–1.2)
Total Protein: 5.9 g/dL — ABNORMAL LOW (ref 6.5–8.1)

## 2015-03-27 LAB — MAGNESIUM: Magnesium: 1.8 mg/dL (ref 1.7–2.4)

## 2015-03-27 MED ORDER — HEPARIN SOD (PORK) LOCK FLUSH 100 UNIT/ML IV SOLN
INTRAVENOUS | Status: AC
  Filled 2015-03-27: qty 5

## 2015-03-27 MED ORDER — HEPARIN SOD (PORK) LOCK FLUSH 100 UNIT/ML IV SOLN
500.0000 [IU] | Freq: Once | INTRAVENOUS | Status: AC | PRN
  Administered 2015-03-27: 500 [IU]

## 2015-03-27 MED ORDER — SODIUM CHLORIDE 0.9 % IV SOLN
Freq: Once | INTRAVENOUS | Status: AC
  Administered 2015-03-27: 11:00:00 via INTRAVENOUS
  Filled 2015-03-27: qty 1000

## 2015-03-27 MED ORDER — SODIUM CHLORIDE 0.9 % IV SOLN
400.0000 mg | Freq: Once | INTRAVENOUS | Status: AC
  Administered 2015-03-27: 400 mg via INTRAVENOUS
  Filled 2015-03-27: qty 40

## 2015-03-27 MED ORDER — SODIUM CHLORIDE 0.9 % IV SOLN
800.0000 mg/m2 | Freq: Once | INTRAVENOUS | Status: AC
  Administered 2015-03-27: 1672 mg via INTRAVENOUS
  Filled 2015-03-27: qty 38.71

## 2015-03-27 MED ORDER — SODIUM CHLORIDE 0.9 % IV SOLN
Freq: Once | INTRAVENOUS | Status: AC
  Administered 2015-03-27: 12:00:00 via INTRAVENOUS
  Filled 2015-03-27: qty 8

## 2015-03-27 NOTE — Progress Notes (Signed)
[place] @ [facility] Telephone:(336) [phone]  Fax:(336) [place]: [birth year]  MR#: [id number]  TGY#:[id number]  Patient Care Team: Derinda Late, MD as PCP - General (Family Medicine)  CHIEF COMPLAINT:  Chief Complaint  Patient presents with  . Lung Cancer   Oncology History   The patient is an 81 year old gentleman with stage IV squamous cell carcinoma of the lung who is seen for assessment prior to Nivolumab.  1. Squamous cell carcinoma of lung. Bronchoscopies positive for right lower lobe mass. Started on radiation therapy and chemotherapy.  August, 2012 Carboplatinum (AUC of 2) and Taxol with concurrent radiation therapy 2. Finished chemo /  radiation  in sept 2012. 3. Bronchoscopy was negative for any  malignant cells (August, 2013) 4. Continuing hemoptysis.  Repeat bronchoscopy is positive for malignant cells consistent with squamous cell carcinoma. 5. Palliative radiation and chemotherapy with carboplatinum and Taxol.(October, 2013) has finished radiation therapy and Taxol, carboplatinum in November of 2013 6.veristrat test is good 7.on Tarceva 8.progressing disease on Tarceva.  (January, 2015) discontinue Tarceva. 9.started on the investigational protocol with  [facility] February, 2015 10Patient is off protocol and continuing NIVO as. commercial product 11.  Complete evaluation during July 2 016 including thoracentesis cytology was negative for malignant cells     12.  Progressive disease by PET scan criteria.  Suggested chemotherapy with carboplatinum and gemcitabine starting from November of 2016  Oncology Flowsheet 11/30/2014 12/14/2014 12/28/2014 01/11/2015 01/26/2015 02/27/2015 03/06/2015  Day, Cycle - - - - - Day 1, Cycle 1 Day 8, Cycle 1  CARBOplatin (PARAPLATIN) IV - - - - - 400 mg -  dexamethasone (DECADRON) IV - - - - - [ 20 mg ] -  gemcitabine (GEMZAR) IV - - - - - 2,000 mg 600 mg/m2  INV-nivolumab (BMS [id number]) IV - - - - - - -    nivolumab (OPDIVO) IV 3 mg/kg 3 mg/kg 3 mg/kg 3 mg/kg 3 mg/kg - -  ondansetron (ZOFRAN) IV - - - - - [ 16 mg ] -  prochlorperazine (COMPAZINE) PO - - - - - - 10 mg   changes.  Was also reviewed with the patient and compared with the previous  INTERVAL HISTORY: 81 year old gentleman with history of recurrent carcinoma of lung\this and has increasing shortness of breath increasing pain.   Patient came back today for further follow-up has a new symptoms of hallucination which is off and on.  According to family cleared up in last few days.  Patient is seeing things.  No other neurological deficit. No chills.  No fever.  Patient had weakness after last chemotherapy cycle.  Neutropenia.  Thrombocytopenia.  And rash which is resolved completely. Patient is here for ongoing evaluation and treatment consideration has not noticed any change in the shortness of breath   REVIEW OF SYSTEMS:   Gen. status: Feeling weak and tired low-grade fever cardiac: No chest pain no palpitation HEENT: No soreness in the marked no difficulty swallowing.  GI: Nausea no vomiting no diarrhea skin: No rash. Lungs: Increasing cough.  Shortness of breath.  Low-grade fever.  Cough with expectoration. Abdomen: No diarrhea. Musculoskeletal system no bony pains. Diabetes is under well control all the blood sugar values have been reviewed.  Lower extremity no swelling.  Patient does have senile tremors. Appetite is poor Neurological symptoms are as described about  Patient is somewhat more depressed. HEENT: No soreness in the mouth or difficulty swallowing As per HPI. Otherwise,  a complete review of systems is negatve.  PAST MEDICAL HISTORY: Past Medical History  Diagnosis Date  . Hypercholesteremia   . Hypertension   . Diabetes mellitus without complication ([place])   . AAA (abdominal aortic aneurysm) ([place])   . Lung cancer ([place])     09/2010  . Skin cancer   . CAD (coronary artery disease) stent placed  . COPD (chronic  obstructive pulmonary disease) ([place])   . Shortness of breath dyspnea   . Pneumonia   . Chronic kidney disease   . GERD (gastroesophageal reflux disease)   . Headache     PAST SURGICAL HISTORY: Past Surgical History  Procedure Laterality Date  . Abdominal aortic aneurysm repair  2000  . Coronary stent placement  2000  . Tonsillectomy      when he was a child    FAMILY HISTORY Family History  Problem Relation Age of Onset  . Cancer Mother   . Cancer Sister   . Cancer Brother   . Cancer Maternal Aunt     Smoking History quit 10 years ago smoked 2 packs a day(1)  PFSH: Comments: family history of breast cancer and melanoma.  Comments: patient had smoked for 40 years one pack per day, quit smoking 10 years ago.  Does not drink  Additional Past Medical and Surgical History: coronary artery disease with stent placement.    Non-insulin-dependent diabetes.    Hypertension.    Multiple skin cancers removed including melanoma.    Aneurysm repair.  Hypercholesteremia     ADVANCED DIRECTIVES: Patient does have advanced healthcare directive   HEALTH MAINTENANCE: Social History  Substance Use Topics  . Smoking status: Former Smoker    Types: Cigarettes, Cigars    Quit date: 04/29/2003  . Smokeless tobacco: Not on file  . Alcohol Use: No       Allergies  Allergen Reactions  . Penicillin G Rash  . Sulfa Antibiotics Rash    Current Outpatient Prescriptions  Medication Sig Dispense Refill  . ADVAIR DISKUS 250-50 MCG/DOSE AEPB INHALE 1 PUFF TWICE A DAY 60 each 0  . benzonatate (TESSALON) 200 MG capsule Take 200 mg by mouth 3 (three) times daily as needed for cough.    . chlorpheniramine-HYDROcodone (TUSSIONEX PENNKINETIC ER) 10-8 MG/5ML SUER Take 5 mLs by mouth every 12 (twelve) hours as needed for cough. 240 mL 0  . citalopram (CELEXA) 20 MG tablet Take 1 tablet (20 mg total) by mouth daily. 30 tablet 3  . fluticasone (FLONASE) 50 MCG/ACT nasal spray 1 spray. 1 spray in  each nostril BID    . furosemide (LASIX) 20 MG tablet Take 1 tablet (20 mg total) by mouth every other day. 30 tablet   . HUMALOG KWIKPEN 100 UNIT/ML KiwkPen CHECK BLOOD SUGAR BEFORE EACH MEAL. INJECT UNDER THE SKIN PER SLIDING SCALE. MAX 30 U PER DAY  2  . ipratropium-albuterol (DUONEB) 0.5-2.5 (3) MG/3ML SOLN Take 3 mLs by nebulization every 8 (eight) hours as needed.    Marland Kitchen LANTUS SOLOSTAR 100 UNIT/ML Solostar Pen 22 Units. 22 units at bedtime    . levofloxacin (LEVAQUIN) 500 MG tablet Take 1 tablet (500 mg total) by mouth daily. 5 tablet 0  . metoprolol succinate (TOPROL-XL) 100 MG 24 hr tablet Take 100 mg by mouth daily.    Marland Kitchen omeprazole (PRILOSEC) 40 MG capsule TAKE 1 CAPSULE (40 MG TOTAL) BY MOUTH DAILY. 30 capsule 0  . ondansetron (ZOFRAN) 4 MG tablet Take 1 tablet (4 mg total) by mouth  every 6 (six) hours as needed for nausea or vomiting. 30 tablet 3  . predniSONE (DELTASONE) 10 MG tablet 1 TAB(S) ORALLY ONCE A DAY 30 tablet 3  . predniSONE (DELTASONE) 10 MG tablet Start 10m tapers by 19mdaily to 0. 15 tablet 0  . PROAIR HFA 108 (90 BASE) MCG/ACT inhaler INHALE 2 PUFFS 4 TIMES A DAY AS NEEDED 8.5 Inhaler 0  . SPIRIVA HANDIHALER 18 MCG inhalation capsule INHALE 1 CAPSULE WITH HANDIHALER ONCE A DAY 30 capsule 5  . tamsulosin (FLOMAX) 0.4 MG CAPS capsule TAKE ONE CAPSULE BY MOUTH EVERY DAY 30 capsule 7   No current facility-administered medications for this visit.   Facility-Administered Medications Ordered in Other Visits  Medication Dose Route Frequency Provider Last Rate Last Dose  . sodium chloride 0.9 % injection 10 mL  10 mL Intracatheter PRN JaForest GleasonMD   10 mL at 09/26/14 1405  . sodium chloride 0.9 % injection 10 mL  10 mL Intracatheter PRN JaForest GleasonMD   10 mL at 02/08/15 1419    OBJECTIVE:  Filed Vitals:   03/27/15 0953  BP: 130/78  Pulse: 96  Temp: 97.5 F (36.4 C)     Body mass index is 27.52 kg/(m^2).    ECOG FS:1 - Symptomatic but completely  ambulatory  PHYSICAL EXAM: Goal status: Performance status is climbing.  Patient has not lost significant weight HEENT: No evidence of stomatitis.    Sclera and conjunctivae :: No jaundice.   pale looking . Lungs: Crepitation and right base  Dullness on percussion on the right side.  Occasional rhonchi on both sides.  Cardiac: Heart sounds are normal.  No pericardial rub.  No murmur. Lymphatic system: Cervical, axillary, inguinal, lymph nodes not palpable GI: Abdomen is soft.  No ascites.  Liver spleen not palpable.  No tenderness.  Bowel sounds are within normal limit Lower extremity: No edema Neurological system: Higher functions, cranial nerves intact No evidence of peripheral neuropathy. : Musculoskeletal system within normal limit  Skin: Maculopapular rash all over the body. (sparing lower extremity)   LAB RESULTS:  Infusion on 03/27/2015  Component Date Value Ref Range Status  . WBC 03/27/2015 15.0* 3.8 - 10.6 K/uL Final  . RBC 03/27/2015 3.65* 4.40 - 5.90 MIL/uL Final  . Hemoglobin 03/27/2015 10.3* 13.0 - 18.0 g/dL Final  . HCT 03/27/2015 32.2* 40.0 - 52.0 % Final  . MCV 03/27/2015 88.3  80.0 - 100.0 fL Final  . MCH 03/27/2015 28.1  26.0 - 34.0 pg Final  . MCHC 03/27/2015 31.8* 32.0 - 36.0 g/dL Final  . RDW 03/27/2015 14.8* 11.5 - 14.5 % Final  . Platelets 03/27/2015 330  150 - 440 K/uL Final  . Neutrophils Relative % 03/27/2015 PENDING   Incomplete  . Neutro Abs 03/27/2015 PENDING  1.7 - 7.7 K/uL Incomplete  . Band Neutrophils 03/27/2015 PENDING   Incomplete  . Lymphocytes Relative 03/27/2015 PENDING   Incomplete  . Lymphs Abs 03/27/2015 PENDING  0.7 - 4.0 K/uL Incomplete  . Monocytes Relative 03/27/2015 PENDING   Incomplete  . Monocytes Absolute 03/27/2015 PENDING  0.1 - 1.0 K/uL Incomplete  . Eosinophils Relative 03/27/2015 PENDING   Incomplete  . Eosinophils Absolute 03/27/2015 PENDING  0.0 - 0.7 K/uL Incomplete  . Basophils Relative 03/27/2015 PENDING   Incomplete   . Basophils Absolute 03/27/2015 PENDING  0.0 - 0.1 K/uL Incomplete  . WBC Morphology 03/27/2015 PENDING   Incomplete  . RBC Morphology 03/27/2015 PENDING   Incomplete  . Smear  Review 03/27/2015 PENDING   Incomplete  . Other 03/27/2015 PENDING   Incomplete  . nRBC 03/27/2015 PENDING  0 /100 WBC Incomplete  . Metamyelocytes Relative 03/27/2015 PENDING   Incomplete  . Myelocytes 03/27/2015 PENDING   Incomplete  . Promyelocytes Absolute 03/27/2015 PENDING   Incomplete  . Blasts 03/27/2015 PENDING   Incomplete  . Sodium 03/27/2015 138  135 - 145 mmol/L Final  . Potassium 03/27/2015 3.5  3.5 - 5.1 mmol/L Final  . Chloride 03/27/2015 100* 101 - 111 mmol/L Final  . CO2 03/27/2015 30  22 - 32 mmol/L Final  . Glucose, Bld 03/27/2015 145* 65 - 99 mg/dL Final  . BUN 03/27/2015 25* 6 - 20 mg/dL Final  . Creatinine, Ser 03/27/2015 1.39* 0.61 - 1.24 mg/dL Final  . Calcium 03/27/2015 8.6* 8.9 - 10.3 mg/dL Final  . Total Protein 03/27/2015 5.9* 6.5 - 8.1 g/dL Final  . Albumin 03/27/2015 3.2* 3.5 - 5.0 g/dL Final  . AST 03/27/2015 15  15 - 41 U/L Final  . ALT 03/27/2015 19  17 - 63 U/L Final  . Alkaline Phosphatase 03/27/2015 55  38 - 126 U/L Final  . Total Bilirubin 03/27/2015 0.5  0.3 - 1.2 mg/dL Final  . GFR calc non Af Amer 03/27/2015 46* >60 mL/min Final  . GFR calc Af Amer 03/27/2015 53* >60 mL/min Final   Comment: (NOTE) The eGFR has been calculated using the CKD EPI equation. This calculation has not been validated in all clinical situations. eGFR's persistently <60 mL/min signify possible Chronic Kidney Disease.   . Anion gap 03/27/2015 8  5 - 15 Final  . Magnesium 03/27/2015 1.8  1.7 - 2.4 mg/dL Final      STUDIES: No results found.  ASSESSMENT: Recurrent carcinoma of lung  Hello sedation exact etiology is not clear I discussed findings with the patient and family that if it continues or if any other neurological symptoms developed and MRI scan of brain would be needed to rule  out any metastases to the brain.  Last brain scan was done in June of 2016  Considering patient's neutropenia other significant weakness dose of gemcitabine would be reduced to 800 mg/m on day 1 and patient will be evaluated on day 8 to decide whether to continue gemcitabine or not.  After total 2 cycles of chemotherapy is PET scan would be done to see whether patient is responding to the treatment or not. MEDICAL DECISION MAKING:  All lab data has been reviewed. All other questions were answered. Continue day 1 second cycle of chemotherapy reduced dose     Clinical: Stage IV (T3, N2, M1b) - Signed by Curt Bears, MD on 05/25/2013     Pathologic: No stage assigned - Marni Griffon, MD   03/27/2015 10:38 AM

## 2015-04-03 ENCOUNTER — Inpatient Hospital Stay: Payer: Medicare Other

## 2015-04-03 ENCOUNTER — Inpatient Hospital Stay: Payer: Medicare Other | Attending: Oncology

## 2015-04-03 ENCOUNTER — Encounter: Payer: Self-pay | Admitting: Oncology

## 2015-04-03 ENCOUNTER — Inpatient Hospital Stay (HOSPITAL_BASED_OUTPATIENT_CLINIC_OR_DEPARTMENT_OTHER): Payer: Medicare Other | Admitting: Oncology

## 2015-04-03 VITALS — BP 100/58 | HR 87 | Temp 96.9°F | Wt 186.1 lb

## 2015-04-03 DIAGNOSIS — D696 Thrombocytopenia, unspecified: Secondary | ICD-10-CM | POA: Diagnosis not present

## 2015-04-03 DIAGNOSIS — D709 Neutropenia, unspecified: Secondary | ICD-10-CM

## 2015-04-03 DIAGNOSIS — R531 Weakness: Secondary | ICD-10-CM

## 2015-04-03 DIAGNOSIS — I129 Hypertensive chronic kidney disease with stage 1 through stage 4 chronic kidney disease, or unspecified chronic kidney disease: Secondary | ICD-10-CM

## 2015-04-03 DIAGNOSIS — Z79899 Other long term (current) drug therapy: Secondary | ICD-10-CM

## 2015-04-03 DIAGNOSIS — Z803 Family history of malignant neoplasm of breast: Secondary | ICD-10-CM | POA: Diagnosis not present

## 2015-04-03 DIAGNOSIS — Z794 Long term (current) use of insulin: Secondary | ICD-10-CM

## 2015-04-03 DIAGNOSIS — I2510 Atherosclerotic heart disease of native coronary artery without angina pectoris: Secondary | ICD-10-CM | POA: Insufficient documentation

## 2015-04-03 DIAGNOSIS — R630 Anorexia: Secondary | ICD-10-CM | POA: Diagnosis not present

## 2015-04-03 DIAGNOSIS — E119 Type 2 diabetes mellitus without complications: Secondary | ICD-10-CM

## 2015-04-03 DIAGNOSIS — Z923 Personal history of irradiation: Secondary | ICD-10-CM | POA: Insufficient documentation

## 2015-04-03 DIAGNOSIS — E7800 Pure hypercholesterolemia, unspecified: Secondary | ICD-10-CM

## 2015-04-03 DIAGNOSIS — Z5112 Encounter for antineoplastic immunotherapy: Secondary | ICD-10-CM | POA: Insufficient documentation

## 2015-04-03 DIAGNOSIS — C342 Malignant neoplasm of middle lobe, bronchus or lung: Secondary | ICD-10-CM | POA: Insufficient documentation

## 2015-04-03 DIAGNOSIS — Z87891 Personal history of nicotine dependence: Secondary | ICD-10-CM | POA: Diagnosis not present

## 2015-04-03 DIAGNOSIS — I714 Abdominal aortic aneurysm, without rupture: Secondary | ICD-10-CM | POA: Insufficient documentation

## 2015-04-03 DIAGNOSIS — Z809 Family history of malignant neoplasm, unspecified: Secondary | ICD-10-CM | POA: Diagnosis not present

## 2015-04-03 DIAGNOSIS — R443 Hallucinations, unspecified: Secondary | ICD-10-CM | POA: Diagnosis not present

## 2015-04-03 DIAGNOSIS — Z85828 Personal history of other malignant neoplasm of skin: Secondary | ICD-10-CM

## 2015-04-03 DIAGNOSIS — K219 Gastro-esophageal reflux disease without esophagitis: Secondary | ICD-10-CM | POA: Insufficient documentation

## 2015-04-03 DIAGNOSIS — R0602 Shortness of breath: Secondary | ICD-10-CM

## 2015-04-03 DIAGNOSIS — Z7952 Long term (current) use of systemic steroids: Secondary | ICD-10-CM

## 2015-04-03 DIAGNOSIS — R509 Fever, unspecified: Secondary | ICD-10-CM | POA: Insufficient documentation

## 2015-04-03 DIAGNOSIS — N189 Chronic kidney disease, unspecified: Secondary | ICD-10-CM

## 2015-04-03 DIAGNOSIS — R21 Rash and other nonspecific skin eruption: Secondary | ICD-10-CM | POA: Insufficient documentation

## 2015-04-03 DIAGNOSIS — C3431 Malignant neoplasm of lower lobe, right bronchus or lung: Secondary | ICD-10-CM

## 2015-04-03 LAB — CBC WITH DIFFERENTIAL/PLATELET
BASOS ABS: 0 10*3/uL (ref 0–0.1)
Basophils Relative: 1 %
EOS PCT: 1 %
Eosinophils Absolute: 0 10*3/uL (ref 0–0.7)
HCT: 32.9 % — ABNORMAL LOW (ref 40.0–52.0)
HEMOGLOBIN: 10.5 g/dL — AB (ref 13.0–18.0)
LYMPHS PCT: 7 %
Lymphs Abs: 0.5 10*3/uL — ABNORMAL LOW (ref 1.0–3.6)
MCH: 28.3 pg (ref 26.0–34.0)
MCHC: 32.1 g/dL (ref 32.0–36.0)
MCV: 88.2 fL (ref 80.0–100.0)
Monocytes Absolute: 0.3 10*3/uL (ref 0.2–1.0)
Monocytes Relative: 5 %
NEUTROS ABS: 6 10*3/uL (ref 1.4–6.5)
NEUTROS PCT: 86 %
PLATELETS: 149 10*3/uL — AB (ref 150–440)
RBC: 3.73 MIL/uL — AB (ref 4.40–5.90)
RDW: 14.8 % — ABNORMAL HIGH (ref 11.5–14.5)
WBC: 6.9 10*3/uL (ref 3.8–10.6)

## 2015-04-03 MED ORDER — SODIUM CHLORIDE 0.9 % IV SOLN
Freq: Once | INTRAVENOUS | Status: AC
  Administered 2015-04-03: 10:00:00 via INTRAVENOUS
  Filled 2015-04-03: qty 1000

## 2015-04-03 MED ORDER — HEPARIN SOD (PORK) LOCK FLUSH 100 UNIT/ML IV SOLN
500.0000 [IU] | Freq: Once | INTRAVENOUS | Status: AC | PRN
  Administered 2015-04-03: 500 [IU]
  Filled 2015-04-03: qty 5

## 2015-04-03 MED ORDER — SODIUM CHLORIDE 0.9 % IJ SOLN
10.0000 mL | INTRAMUSCULAR | Status: DC | PRN
  Administered 2015-04-03: 10 mL
  Filled 2015-04-03: qty 10

## 2015-04-03 MED ORDER — SODIUM CHLORIDE 0.9 % IV SOLN
800.0000 mg/m2 | Freq: Once | INTRAVENOUS | Status: AC
  Administered 2015-04-03: 1672 mg via INTRAVENOUS
  Filled 2015-04-03: qty 43.97

## 2015-04-03 MED ORDER — SODIUM CHLORIDE 0.9 % IV SOLN
Freq: Once | INTRAVENOUS | Status: AC
  Administered 2015-04-03: 11:00:00 via INTRAVENOUS
  Filled 2015-04-03: qty 4

## 2015-04-03 NOTE — Progress Notes (Signed)
[place] @ [facility] [phone]) [phone]  Fax:(336) [place]: 1930-06-10  MR#: [id number]  SNK#:[id number]  Patient Care Team: Derinda Late, MD as PCP - General (Family Medicine)  CHIEF COMPLAINT:  Chief Complaint  Patient presents with  . Lung Cancer   Oncology History   The patient is an 81 year old gentleman with stage IV squamous cell carcinoma of the lung who is seen for assessment prior to Nivolumab.  1. Squamous cell carcinoma of lung. Bronchoscopies positive for right lower lobe mass. Started on radiation therapy and chemotherapy.  August, 2012 Carboplatinum (AUC of 2) and Taxol with concurrent radiation therapy 2. Finished chemo /  radiation  in sept 2012. 3. Bronchoscopy was negative for any  malignant cells (August, 2013) 4. Continuing hemoptysis.  Repeat bronchoscopy is positive for malignant cells consistent with squamous cell carcinoma. 5. Palliative radiation and chemotherapy with carboplatinum and Taxol.(October, 2013) has finished radiation therapy and Taxol, carboplatinum in November of 2013 6.veristrat test is good 7.on Tarceva 8.progressing disease on Tarceva.  (January, 2015) discontinue Tarceva. 9.started on the investigational protocol with  [facility] February, 2015 10Patient is off protocol and continuing NIVO as. commercial product 11.  Complete evaluation during July 2 016 including thoracentesis cytology was negative for malignant cells     12.  Progressive disease by PET scan criteria.  Suggested chemotherapy with carboplatinum and gemcitabine starting from November of 2016  Oncology Flowsheet 12/14/2014 12/28/2014 01/11/2015 01/26/2015 02/27/2015 03/06/2015 03/27/2015  Day, Cycle - - - - Day 1, Cycle 1 Day 8, Cycle 1 Day 1, Cycle 2  CARBOplatin (PARAPLATIN) IV - - - - 400 mg - 400 mg  dexamethasone (DECADRON) IV - - - - [ 20 mg ] - [ 20 mg ]  gemcitabine (GEMZAR) IV - - - - 2,000 mg 600 mg/m2 800 mg/m2  INV-nivolumab (BMS  [id number]) IV - - - - - - -  nivolumab (OPDIVO) IV 3 mg/kg 3 mg/kg 3 mg/kg 3 mg/kg - - -  ondansetron (ZOFRAN) IV - - - - [ 16 mg ] - [ 16 mg ]  prochlorperazine (COMPAZINE) PO - - - - - 10 mg -   changes.  Was also reviewed with the patient and compared with the previous  INTERVAL HISTORY: 81 year old gentleman with history of recurrent carcinoma of lung\this and has increasing shortness of breath increasing pain.   Patient came back today for further follow-up has a new symptoms of hallucination which is off and on.  According to family cleared up in last few days.  Patient is seeing things.  No other neurological deficit. No chills.  No fever.  Patient had weakness after last chemotherapy cycle.  Neutropenia.  Thrombocytopenia.  And rash which is resolved completely. Patient is here for ongoing evaluation and treatment consideration has not noticed any change in the shortness of breath  He is here for ongoing evaluation and treatment consideration.  Warning to family cough is improved but shortness of breath persists.  Patient slept most the time.  No chest pain.  Patient's family gives nausea medication for 2 days following chemotherapy which has helped control nausea.  Taking cough syrup in the night.  Patient also has diabetes and all the blood sugars have been reviewed REVIEW OF SYSTEMS:   Gen. status: Feeling weak and tired low-grade fever cardiac: No chest pain no palpitation HEENT: No soreness in the marked no difficulty swallowing.  GI: Nausea no vomiting no diarrhea skin: No  rash. Lungs: Increasing cough.  Shortness of breath.  Low-grade fever.  Cough with expectoration. Abdomen: No diarrhea. Musculoskeletal system no bony pains. Diabetes is under well control all the blood sugar values have been reviewed.  Lower extremity no swelling.  Patient does have senile tremors. Appetite is poor Neurological symptoms are as described about  Patient is somewhat more depressed. HEENT: No  soreness in the mouth or difficulty swallowing As per HPI. Otherwise, a complete review of systems is negatve.  PAST MEDICAL HISTORY: Past Medical History  Diagnosis Date  . Hypercholesteremia   . Hypertension   . Diabetes mellitus without complication ([place])   . AAA (abdominal aortic aneurysm) ([place])   . Lung cancer ([place])     09/2010  . Skin cancer   . CAD (coronary artery disease) stent placed  . COPD (chronic obstructive pulmonary disease) ([place])   . Shortness of breath dyspnea   . Pneumonia   . Chronic kidney disease   . GERD (gastroesophageal reflux disease)   . Headache     PAST SURGICAL HISTORY: Past Surgical History  Procedure Laterality Date  . Abdominal aortic aneurysm repair  2000  . Coronary stent placement  2000  . Tonsillectomy      when he was a child    FAMILY HISTORY Family History  Problem Relation Age of Onset  . Cancer Mother   . Cancer Sister   . Cancer Brother   . Cancer Maternal Aunt     Smoking History quit 10 years ago smoked 2 packs a day(1)  PFSH: Comments: family history of breast cancer and melanoma.  Comments: patient had smoked for 40 years one pack per day, quit smoking 10 years ago.  Does not drink  Additional Past Medical and Surgical History: coronary artery disease with stent placement.    Non-insulin-dependent diabetes.    Hypertension.    Multiple skin cancers removed including melanoma.    Aneurysm repair.  Hypercholesteremia     ADVANCED DIRECTIVES: Patient does have advanced healthcare directive   HEALTH MAINTENANCE: Social History  Substance Use Topics  . Smoking status: Former Smoker    Types: Cigarettes, Cigars    Quit date: 04/29/2003  . Smokeless tobacco: None  . Alcohol Use: No       Allergies  Allergen Reactions  . Penicillin G Rash  . Sulfa Antibiotics Rash    Current Outpatient Prescriptions  Medication Sig Dispense Refill  . ADVAIR DISKUS 250-50 MCG/DOSE AEPB INHALE 1 PUFF TWICE A DAY 60 each 0    . benzonatate (TESSALON) 200 MG capsule Take 200 mg by mouth 3 (three) times daily as needed for cough.    . chlorpheniramine-HYDROcodone (TUSSIONEX PENNKINETIC ER) 10-8 MG/5ML SUER Take 5 mLs by mouth every 12 (twelve) hours as needed for cough. 240 mL 0  . citalopram (CELEXA) 20 MG tablet Take 1 tablet (20 mg total) by mouth daily. 30 tablet 3  . fluticasone (FLONASE) 50 MCG/ACT nasal spray 1 spray. 1 spray in each nostril BID    . furosemide (LASIX) 20 MG tablet Take 1 tablet (20 mg total) by mouth every other day. 30 tablet   . HUMALOG KWIKPEN 100 UNIT/ML KiwkPen CHECK BLOOD SUGAR BEFORE EACH MEAL. INJECT UNDER THE SKIN PER SLIDING SCALE. MAX 30 U PER DAY  2  . ipratropium-albuterol (DUONEB) 0.5-2.5 (3) MG/3ML SOLN Take 3 mLs by nebulization every 8 (eight) hours as needed.    Marland Kitchen LANTUS SOLOSTAR 100 UNIT/ML Solostar Pen 22 Units. 22 units at  bedtime    . levofloxacin (LEVAQUIN) 500 MG tablet Take 1 tablet (500 mg total) by mouth daily. 5 tablet 0  . metoprolol succinate (TOPROL-XL) 100 MG 24 hr tablet Take 100 mg by mouth daily.    Marland Kitchen omeprazole (PRILOSEC) 40 MG capsule TAKE 1 CAPSULE (40 MG TOTAL) BY MOUTH DAILY. 30 capsule 0  . ondansetron (ZOFRAN) 4 MG tablet Take 1 tablet (4 mg total) by mouth every 6 (six) hours as needed for nausea or vomiting. 30 tablet 3  . predniSONE (DELTASONE) 10 MG tablet 1 TAB(S) ORALLY ONCE A DAY 30 tablet 3  . predniSONE (DELTASONE) 10 MG tablet Start '50mg'$  tapers by '10mg'$  daily to 0. 15 tablet 0  . PROAIR HFA 108 (90 BASE) MCG/ACT inhaler INHALE 2 PUFFS 4 TIMES A DAY AS NEEDED 8.5 Inhaler 0  . SPIRIVA HANDIHALER 18 MCG inhalation capsule INHALE 1 CAPSULE WITH HANDIHALER ONCE A DAY 30 capsule 5  . tamsulosin (FLOMAX) 0.4 MG CAPS capsule TAKE ONE CAPSULE BY MOUTH EVERY DAY 30 capsule 7   No current facility-administered medications for this visit.   Facility-Administered Medications Ordered in Other Visits  Medication Dose Route Frequency Provider Last Rate Last  Dose  . sodium chloride 0.9 % injection 10 mL  10 mL Intracatheter PRN Forest Gleason, MD   10 mL at 09/26/14 1405  . sodium chloride 0.9 % injection 10 mL  10 mL Intracatheter PRN Forest Gleason, MD   10 mL at 02/08/15 1419  . sodium chloride 0.9 % injection 10 mL  10 mL Intracatheter PRN Forest Gleason, MD   10 mL at 04/03/15 0854    OBJECTIVE:  Filed Vitals:   04/03/15 0918  BP: 100/58  Pulse: 87  Temp: 96.9 F (36.1 C)     Body mass index is 26.7 kg/(m^2).    ECOG FS:1 - Symptomatic but completely ambulatory  PHYSICAL EXAM: Goal status: Performance status is climbing.  Patient has not lost significant weight HEENT: No evidence of stomatitis.    Sclera and conjunctivae :: No jaundice.   pale looking . Lungs: Crepitation and right base  Dullness on percussion on the right side.  Occasional rhonchi on both sides.  Cardiac: Heart sounds are normal.  No pericardial rub.  No murmur. Lymphatic system: Cervical, axillary, inguinal, lymph nodes not palpable GI: Abdomen is soft.  No ascites.  Liver spleen not palpable.  No tenderness.  Bowel sounds are within normal limit Lower extremity: No edema Neurological system: Higher functions, cranial nerves intact No evidence of peripheral neuropathy. : Musculoskeletal system within normal limit  Skin: Maculopapular rash all over the body. (sparing lower extremity)   LAB RESULTS:  Infusion on 04/03/2015  Component Date Value Ref Range Status  . WBC 04/03/2015 6.9  3.8 - 10.6 K/uL Final  . RBC 04/03/2015 3.73* 4.40 - 5.90 MIL/uL Final  . Hemoglobin 04/03/2015 10.5* 13.0 - 18.0 g/dL Final  . HCT 04/03/2015 32.9* 40.0 - 52.0 % Final  . MCV 04/03/2015 88.2  80.0 - 100.0 fL Final  . MCH 04/03/2015 28.3  26.0 - 34.0 pg Final  . MCHC 04/03/2015 32.1  32.0 - 36.0 g/dL Final  . RDW 04/03/2015 14.8* 11.5 - 14.5 % Final  . Platelets 04/03/2015 149* 150 - 440 K/uL Final  . Neutrophils Relative % 04/03/2015 86   Final  . Neutro Abs 04/03/2015 6.0  1.4  - 6.5 K/uL Final  . Lymphocytes Relative 04/03/2015 7   Final  . Lymphs Abs 04/03/2015 0.5*  1.0 - 3.6 K/uL Final  . Monocytes Relative 04/03/2015 5   Final  . Monocytes Absolute 04/03/2015 0.3  0.2 - 1.0 K/uL Final  . Eosinophils Relative 04/03/2015 1   Final  . Eosinophils Absolute 04/03/2015 0.0  0 - 0.7 K/uL Final  . Basophils Relative 04/03/2015 1   Final  . Basophils Absolute 04/03/2015 0.0  0 - 0.1 K/uL Final      STUDIES: No results found.  ASSESSMENT: Recurrent carcinoma of lung Generalized weakness may be secondary to chemotherapy this has been explained to the patient.  In family.  Take appropriate rest Drink a lot of fluid.  Anemia multifactorial most likely chemotherapy induced Blood  sugars have been reviewed Patient is taking sliding scale insulin Patient was advised to tinea sliding scale insulin Dose of the gemcitabine has been reduced of 800 mg to avoid myelosuppression that patient had during the last treatment and rash  MEDICAL DECISION MAKING:  All lab data has been reviewed. All other questions were answered.      Clinical: Stage IV (T3, N2, M1b) - Signed by Curt Bears, MD on 05/25/2013     Pathologic: No stage assigned - Marni Griffon, MD   04/03/2015 9:38 AM

## 2015-04-16 ENCOUNTER — Other Ambulatory Visit: Payer: Self-pay | Admitting: Oncology

## 2015-04-17 ENCOUNTER — Encounter: Payer: Self-pay | Admitting: Oncology

## 2015-04-17 ENCOUNTER — Inpatient Hospital Stay (HOSPITAL_BASED_OUTPATIENT_CLINIC_OR_DEPARTMENT_OTHER): Payer: Medicare Other | Admitting: Oncology

## 2015-04-17 ENCOUNTER — Inpatient Hospital Stay: Payer: Medicare Other

## 2015-04-17 VITALS — BP 130/71 | HR 78 | Temp 97.2°F | Resp 18 | Wt 190.9 lb

## 2015-04-17 DIAGNOSIS — C342 Malignant neoplasm of middle lobe, bronchus or lung: Secondary | ICD-10-CM

## 2015-04-17 DIAGNOSIS — Z803 Family history of malignant neoplasm of breast: Secondary | ICD-10-CM

## 2015-04-17 DIAGNOSIS — K219 Gastro-esophageal reflux disease without esophagitis: Secondary | ICD-10-CM

## 2015-04-17 DIAGNOSIS — I2510 Atherosclerotic heart disease of native coronary artery without angina pectoris: Secondary | ICD-10-CM

## 2015-04-17 DIAGNOSIS — Z85828 Personal history of other malignant neoplasm of skin: Secondary | ICD-10-CM

## 2015-04-17 DIAGNOSIS — R531 Weakness: Secondary | ICD-10-CM | POA: Diagnosis not present

## 2015-04-17 DIAGNOSIS — R21 Rash and other nonspecific skin eruption: Secondary | ICD-10-CM | POA: Diagnosis not present

## 2015-04-17 DIAGNOSIS — R630 Anorexia: Secondary | ICD-10-CM

## 2015-04-17 DIAGNOSIS — C3431 Malignant neoplasm of lower lobe, right bronchus or lung: Secondary | ICD-10-CM

## 2015-04-17 DIAGNOSIS — N189 Chronic kidney disease, unspecified: Secondary | ICD-10-CM

## 2015-04-17 DIAGNOSIS — E119 Type 2 diabetes mellitus without complications: Secondary | ICD-10-CM

## 2015-04-17 DIAGNOSIS — I714 Abdominal aortic aneurysm, without rupture: Secondary | ICD-10-CM

## 2015-04-17 DIAGNOSIS — Z5112 Encounter for antineoplastic immunotherapy: Secondary | ICD-10-CM | POA: Diagnosis not present

## 2015-04-17 DIAGNOSIS — R0602 Shortness of breath: Secondary | ICD-10-CM | POA: Diagnosis not present

## 2015-04-17 DIAGNOSIS — Z7952 Long term (current) use of systemic steroids: Secondary | ICD-10-CM

## 2015-04-17 DIAGNOSIS — Z794 Long term (current) use of insulin: Secondary | ICD-10-CM

## 2015-04-17 DIAGNOSIS — Z809 Family history of malignant neoplasm, unspecified: Secondary | ICD-10-CM

## 2015-04-17 DIAGNOSIS — E7800 Pure hypercholesterolemia, unspecified: Secondary | ICD-10-CM

## 2015-04-17 DIAGNOSIS — Z87891 Personal history of nicotine dependence: Secondary | ICD-10-CM

## 2015-04-17 DIAGNOSIS — Z79899 Other long term (current) drug therapy: Secondary | ICD-10-CM

## 2015-04-17 DIAGNOSIS — I129 Hypertensive chronic kidney disease with stage 1 through stage 4 chronic kidney disease, or unspecified chronic kidney disease: Secondary | ICD-10-CM

## 2015-04-17 DIAGNOSIS — Z923 Personal history of irradiation: Secondary | ICD-10-CM

## 2015-04-17 LAB — COMPREHENSIVE METABOLIC PANEL
ALBUMIN: 3.6 g/dL (ref 3.5–5.0)
ALK PHOS: 59 U/L (ref 38–126)
ALT: 24 U/L (ref 17–63)
AST: 16 U/L (ref 15–41)
Anion gap: 7 (ref 5–15)
BILIRUBIN TOTAL: 0.3 mg/dL (ref 0.3–1.2)
BUN: 22 mg/dL — AB (ref 6–20)
CALCIUM: 8.7 mg/dL — AB (ref 8.9–10.3)
CO2: 30 mmol/L (ref 22–32)
Chloride: 103 mmol/L (ref 101–111)
Creatinine, Ser: 1.28 mg/dL — ABNORMAL HIGH (ref 0.61–1.24)
GFR calc Af Amer: 58 mL/min — ABNORMAL LOW (ref >60)
GFR, EST NON AFRICAN AMERICAN: 50 mL/min — AB (ref >60)
GLUCOSE: 137 mg/dL — AB (ref 65–99)
Potassium: 3.6 mmol/L (ref 3.5–5.1)
Sodium: 140 mmol/L (ref 135–145)
TOTAL PROTEIN: 5.9 g/dL — AB (ref 6.5–8.1)

## 2015-04-17 LAB — CBC WITH DIFFERENTIAL/PLATELET
BASOS PCT: 1 %
Basophils Absolute: 0 10*3/uL (ref 0–0.1)
Eosinophils Absolute: 0.1 10*3/uL (ref 0–0.7)
Eosinophils Relative: 1 %
HEMATOCRIT: 30.5 % — AB (ref 40.0–52.0)
HEMOGLOBIN: 9.8 g/dL — AB (ref 13.0–18.0)
LYMPHS PCT: 6 %
Lymphs Abs: 0.5 10*3/uL — ABNORMAL LOW (ref 1.0–3.6)
MCH: 28.9 pg (ref 26.0–34.0)
MCHC: 32.1 g/dL (ref 32.0–36.0)
MCV: 90.1 fL (ref 80.0–100.0)
MONO ABS: 1.1 10*3/uL — AB (ref 0.2–1.0)
MONOS PCT: 15 %
NEUTROS ABS: 5.6 10*3/uL (ref 1.4–6.5)
NEUTROS PCT: 77 %
Platelets: 181 10*3/uL (ref 150–440)
RBC: 3.39 MIL/uL — ABNORMAL LOW (ref 4.40–5.90)
RDW: 17 % — ABNORMAL HIGH (ref 11.5–14.5)
WBC: 7.3 10*3/uL (ref 3.8–10.6)

## 2015-04-17 LAB — MAGNESIUM: MAGNESIUM: 1.8 mg/dL (ref 1.7–2.4)

## 2015-04-17 MED ORDER — SODIUM CHLORIDE 0.9 % IV SOLN
Freq: Once | INTRAVENOUS | Status: AC
  Administered 2015-04-17: 10:00:00 via INTRAVENOUS
  Filled 2015-04-17: qty 1000

## 2015-04-17 MED ORDER — HEPARIN SOD (PORK) LOCK FLUSH 100 UNIT/ML IV SOLN
500.0000 [IU] | Freq: Once | INTRAVENOUS | Status: AC | PRN
  Administered 2015-04-17: 500 [IU]
  Filled 2015-04-17: qty 5

## 2015-04-17 MED ORDER — SODIUM CHLORIDE 0.9 % IV SOLN
Freq: Once | INTRAVENOUS | Status: AC
  Administered 2015-04-17: 10:00:00 via INTRAVENOUS
  Filled 2015-04-17: qty 8

## 2015-04-17 MED ORDER — TRIAMCINOLONE 0.1 % CREAM:EUCERIN CREAM 1:1: 1.0000 "application " | TOPICAL_CREAM | Freq: Every day | CUTANEOUS | Status: AC

## 2015-04-17 MED ORDER — SODIUM CHLORIDE 0.9 % IV SOLN
397.5000 mg | Freq: Once | INTRAVENOUS | Status: AC
  Administered 2015-04-17: 400 mg via INTRAVENOUS
  Filled 2015-04-17: qty 40

## 2015-04-17 MED ORDER — SODIUM CHLORIDE 0.9 % IJ SOLN
10.0000 mL | Freq: Once | INTRAMUSCULAR | Status: AC
  Administered 2015-04-17: 10 mL via INTRAVENOUS
  Filled 2015-04-17: qty 10

## 2015-04-17 MED ORDER — SODIUM CHLORIDE 0.9 % IV SOLN
800.0000 mg/m2 | Freq: Once | INTRAVENOUS | Status: AC
  Administered 2015-04-17: 1672 mg via INTRAVENOUS
  Filled 2015-04-17: qty 38.71

## 2015-04-17 NOTE — Progress Notes (Signed)
Pt complains of congestion in his head and increased cough at night. States he feels real nervous and shaky.

## 2015-04-17 NOTE — Progress Notes (Signed)
[place] @ [facility] [phone]) [phone]  Fax:([phone]) [place]: 10/31/1930  MR#: [id number]  UQJ#:[id number]  Patient Care Team: Derinda Late, MD as PCP - General (Family Medicine)  CHIEF COMPLAINT:  Chief Complaint  Patient presents with  . Lung Cancer   Oncology History   The patient is an 81 year old gentleman with stage IV squamous cell carcinoma of the lung who is seen for assessment prior to Nivolumab.  1. Squamous cell carcinoma of lung. Bronchoscopies positive for right lower lobe mass. Started on radiation therapy and chemotherapy.  August, 2012 Carboplatinum (AUC of 2) and Taxol with concurrent radiation therapy 2. Finished chemo /  radiation  in sept 2012. 3. Bronchoscopy was negative for any  malignant cells (August, 2013) 4. Continuing hemoptysis.  Repeat bronchoscopy is positive for malignant cells consistent with squamous cell carcinoma. 5. Palliative radiation and chemotherapy with carboplatinum and Taxol.(October, 2013) has finished radiation therapy and Taxol, carboplatinum in November of 2013 6.veristrat test is good 7.on Tarceva 8.progressing disease on Tarceva.  (January, 2015) discontinue Tarceva. 9.started on the investigational protocol with  [facility] February, 2015 10Patient is off protocol and continuing NIVO as. commercial product 11.  Complete evaluation during July 2 016 including thoracentesis cytology was negative for malignant cells     12.  Progressive disease by PET scan criteria.  Suggested chemotherapy with carboplatinum and gemcitabine starting from November of 2016  Oncology Flowsheet 12/28/2014 01/11/2015 01/26/2015 02/27/2015 03/06/2015 03/27/2015 04/03/2015  Day, Cycle - - - Day 1, Cycle 1 Day 8, Cycle 1 Day 1, Cycle 2 Day 8, Cycle 2  CARBOplatin (PARAPLATIN) IV - - - 400 mg - 400 mg -  dexamethasone (DECADRON) IV - - - [ 20 mg ] - [ 20 mg ] [ 4 mg ]  gemcitabine (GEMZAR) IV - - - 2,000 mg 600 mg/m2 800 mg/m2 800  mg/m2  INV-nivolumab (BMS [id number]) IV - - - - - - -  nivolumab (OPDIVO) IV 3 mg/kg 3 mg/kg 3 mg/kg - - - -  ondansetron (ZOFRAN) IV - - - [ 16 mg ] - [ 16 mg ] [ 8 mg ]  prochlorperazine (COMPAZINE) PO - - - - 10 mg - -   changes.  Was also reviewed with the patient and compared with the previous  INTERVAL HISTORY: 81 year old gentleman with history of recurrent carcinoma of lung\this and has increasing shortness of breath increasing pain.   Patient is here for ongoing evaluation and treatment consideration. Continues to have generalized itching,  rash in upper extremity. Shortness of of breath and cough has been stable.  Patient is here for continuation off chemotherapy for recurrent and progressive carcinoma of lung.. Patient also has diabetes and all the blood sugars have been reviewed REVIEW OF SYSTEMS:   Gen. status: Feeling weak and tired low-grade fever cardiac: No chest pain no palpitation HEENT: No soreness in the marked no difficulty swallowing.  GI: Nausea no vomiting no diarrhea skin: Lungs: Increasing cough.  Shortness of breath.  Low-grade fever.  Cough with expectoration. Abdomen: No diarrhea. Musculoskeletal system no bony pains. Diabetes is under well control all the blood sugar values have been reviewed. .  Lower extremity no swelling.  Patient does have senile tremors. Appetite is improved Neurological symptoms are as described about Skin: Rash in upper extremity,   .dry skin  HEENT: No soreness in the mouth or difficulty swallowing As per HPI. Otherwise, a complete review of systems is negatve.  PAST MEDICAL HISTORY: Past Medical History  Diagnosis Date  . Hypercholesteremia   . Hypertension   . Diabetes mellitus without complication ([place])   . AAA (abdominal aortic aneurysm) ([place])   . Lung cancer ([place])     09/2010  . Skin cancer   . CAD (coronary artery disease) stent placed  . COPD (chronic obstructive pulmonary disease) ([place])   . Shortness of breath  dyspnea   . Pneumonia   . Chronic kidney disease   . GERD (gastroesophageal reflux disease)   . Headache     PAST SURGICAL HISTORY: Past Surgical History  Procedure Laterality Date  . Abdominal aortic aneurysm repair  2000  . Coronary stent placement  2000  . Tonsillectomy      when he was a child    FAMILY HISTORY Family History  Problem Relation Age of Onset  . Cancer Mother   . Cancer Sister   . Cancer Brother   . Cancer Maternal Aunt     Smoking History quit 10 years ago smoked 2 packs a day(1)  PFSH: Comments: family history of breast cancer and melanoma.  Comments: patient had smoked for 40 years one pack per day, quit smoking 10 years ago.  Does not drink  Additional Past Medical and Surgical History: coronary artery disease with stent placement.    Non-insulin-dependent diabetes.    Hypertension.    Multiple skin cancers removed including melanoma.    Aneurysm repair.  Hypercholesteremia     ADVANCED DIRECTIVES: Patient does have advanced healthcare directive   HEALTH MAINTENANCE: Social History  Substance Use Topics  . Smoking status: Former Smoker    Types: Cigarettes, Cigars    Quit date: 04/29/2003  . Smokeless tobacco: None  . Alcohol Use: No       Allergies  Allergen Reactions  . Penicillin G Rash  . Sulfa Antibiotics Rash    Current Outpatient Prescriptions  Medication Sig Dispense Refill  . ADVAIR DISKUS 250-50 MCG/DOSE AEPB INHALE 1 PUFF TWICE A DAY 60 each 0  . benzonatate (TESSALON) 200 MG capsule Take 200 mg by mouth 3 (three) times daily as needed for cough.    . chlorpheniramine-HYDROcodone (TUSSIONEX PENNKINETIC ER) 10-8 MG/5ML SUER Take 5 mLs by mouth every 12 (twelve) hours as needed for cough. 240 mL 0  . citalopram (CELEXA) 20 MG tablet Take 1 tablet (20 mg total) by mouth daily. 30 tablet 3  . fluticasone (FLONASE) 50 MCG/ACT nasal spray 1 spray. 1 spray in each nostril BID    . furosemide (LASIX) 20 MG tablet Take 1 tablet  (20 mg total) by mouth every other day. 30 tablet   . HUMALOG KWIKPEN 100 UNIT/ML KiwkPen CHECK BLOOD SUGAR BEFORE EACH MEAL. INJECT UNDER THE SKIN PER SLIDING SCALE. MAX 30 U PER DAY  2  . ipratropium-albuterol (DUONEB) 0.5-2.5 (3) MG/3ML SOLN Take 3 mLs by nebulization every 8 (eight) hours as needed.    Marland Kitchen LANTUS SOLOSTAR 100 UNIT/ML Solostar Pen 22 Units. 22 units at bedtime    . levofloxacin (LEVAQUIN) 500 MG tablet Take 1 tablet (500 mg total) by mouth daily. 5 tablet 0  . metoprolol succinate (TOPROL-XL) 100 MG 24 hr tablet Take 100 mg by mouth daily.    Marland Kitchen omeprazole (PRILOSEC) 40 MG capsule TAKE 1 CAPSULE (40 MG TOTAL) BY MOUTH DAILY. 30 capsule 0  . ondansetron (ZOFRAN) 4 MG tablet Take 1 tablet (4 mg total) by mouth every 6 (six) hours as needed for nausea or vomiting.  30 tablet 3  . predniSONE (DELTASONE) 10 MG tablet 1 TAB(S) ORALLY ONCE A DAY 30 tablet 3  . predniSONE (DELTASONE) 10 MG tablet Start 51m tapers by 114mdaily to 0. 15 tablet 0  . PROAIR HFA 108 (90 BASE) MCG/ACT inhaler INHALE 2 PUFFS 4 TIMES A DAY AS NEEDED 8.5 Inhaler 0  . SPIRIVA HANDIHALER 18 MCG inhalation capsule INHALE 1 CAPSULE WITH HANDIHALER ONCE A DAY 30 capsule 5  . tamsulosin (FLOMAX) 0.4 MG CAPS capsule TAKE ONE CAPSULE BY MOUTH EVERY DAY 30 capsule 7   No current facility-administered medications for this visit.   Facility-Administered Medications Ordered in Other Visits  Medication Dose Route Frequency Provider Last Rate Last Dose  . sodium chloride 0.9 % injection 10 mL  10 mL Intracatheter PRN JaForest GleasonMD   10 mL at 09/26/14 1405  . sodium chloride 0.9 % injection 10 mL  10 mL Intracatheter PRN JaForest GleasonMD   10 mL at 02/08/15 1419    OBJECTIVE:  Filed Vitals:   04/17/15 0847  BP: 130/71  Pulse: 78  Temp: 97.2 F (36.2 C)  Resp: 18     Body mass index is 27.39 kg/(m^2).    ECOG FS:1 - Symptomatic but completely ambulatory  PHYSICAL EXAM: Goal status: Performance status is  climbing.  Patient has not lost significant weight HEENT: No evidence of stomatitis.    Sclera and conjunctivae :: No jaundice.   pale looking . Lungs: Crepitation and right base  Dullness on percussion on the right side.  Occasional rhonchi on both sides.  Cardiac: Heart sounds are normal.  No pericardial rub.  No murmur. Lymphatic system: Cervical, axillary, inguinal, lymph nodes not palpable GI: Abdomen is soft.  No ascites.  Liver spleen not palpable.  No tenderness.  Bowel sounds are within normal limit Lower extremity: No edema Neurological system: Higher functions, cranial nerves intact No evidence of peripheral neuropathy. : Musculoskeletal system within normal limit  Skin: Maculopapular rash all over the body. (sparing lower extremity)   LAB RESULTS:  Infusion on 04/17/2015  Component Date Value Ref Range Status  . WBC 04/17/2015 7.3  3.8 - 10.6 K/uL Final  . RBC 04/17/2015 3.39* 4.40 - 5.90 MIL/uL Final  . Hemoglobin 04/17/2015 9.8* 13.0 - 18.0 g/dL Final  . HCT 04/17/2015 30.5* 40.0 - 52.0 % Final  . MCV 04/17/2015 90.1  80.0 - 100.0 fL Final  . MCH 04/17/2015 28.9  26.0 - 34.0 pg Final  . MCHC 04/17/2015 32.1  32.0 - 36.0 g/dL Final  . RDW 04/17/2015 17.0* 11.5 - 14.5 % Final  . Platelets 04/17/2015 181  150 - 440 K/uL Final  . Neutrophils Relative % 04/17/2015 77   Final  . Neutro Abs 04/17/2015 5.6  1.4 - 6.5 K/uL Final  . Lymphocytes Relative 04/17/2015 6   Final  . Lymphs Abs 04/17/2015 0.5* 1.0 - 3.6 K/uL Final  . Monocytes Relative 04/17/2015 15   Final  . Monocytes Absolute 04/17/2015 1.1* 0.2 - 1.0 K/uL Final  . Eosinophils Relative 04/17/2015 1   Final  . Eosinophils Absolute 04/17/2015 0.1  0 - 0.7 K/uL Final  . Basophils Relative 04/17/2015 1   Final  . Basophils Absolute 04/17/2015 0.0  0 - 0.1 K/uL Final  . Sodium 04/17/2015 140  135 - 145 mmol/L Final  . Potassium 04/17/2015 3.6  3.5 - 5.1 mmol/L Final  . Chloride 04/17/2015 103  101 - 111 mmol/L  Final  . CO2 04/17/2015  30  22 - 32 mmol/L Final  . Glucose, Bld 04/17/2015 137* 65 - 99 mg/dL Final  . BUN 04/17/2015 22* 6 - 20 mg/dL Final  . Creatinine, Ser 04/17/2015 1.28* 0.61 - 1.24 mg/dL Final  . Calcium 04/17/2015 8.7* 8.9 - 10.3 mg/dL Final  . Total Protein 04/17/2015 5.9* 6.5 - 8.1 g/dL Final  . Albumin 04/17/2015 3.6  3.5 - 5.0 g/dL Final  . AST 04/17/2015 16  15 - 41 U/L Final  . ALT 04/17/2015 24  17 - 63 U/L Final  . Alkaline Phosphatase 04/17/2015 59  38 - 126 U/L Final  . Total Bilirubin 04/17/2015 0.3  0.3 - 1.2 mg/dL Final  . GFR calc non Af Amer 04/17/2015 50* >60 mL/min Final  . GFR calc Af Amer 04/17/2015 58* >60 mL/min Final   Comment: (NOTE) The eGFR has been calculated using the CKD EPI equation. This calculation has not been validated in all clinical situations. eGFR's persistently <60 mL/min signify possible Chronic Kidney Disease.   . Anion gap 04/17/2015 7  5 - 15 Final  . Magnesium 04/17/2015 1.8  1.7 - 2.4 mg/dL Final      STUDIES: No results found.  ASSESSMENT: Recurrent carcinoma of lung Will continue chemotherapy with carboplatinum and gemcitabine today and gemcitabine next week. Rash is most likely secondary to gemcitabine therapy will give Eucerin and Aristocort cream  For reevaluation off for lung cancer a PET scan has been ordered prior to next therapy  Duration of visit is 25 minutes and 50% of time was spent discussing Management of rash.  Need for another PET scan.  Continuing control of diabetes  MEDICAL DECISION MAKING:  All lab data has been reviewed. All other questions were answered.      Clinical: Stage IV (T3, N2, M1b) - Signed by Curt Bears, MD on 05/25/2013     Pathologic: No stage assigned - Marni Griffon, MD   04/17/2015 9:04 AM

## 2015-04-22 ENCOUNTER — Other Ambulatory Visit: Payer: Self-pay | Admitting: Oncology

## 2015-04-24 ENCOUNTER — Inpatient Hospital Stay: Payer: Medicare Other

## 2015-04-24 DIAGNOSIS — C342 Malignant neoplasm of middle lobe, bronchus or lung: Secondary | ICD-10-CM

## 2015-04-24 DIAGNOSIS — Z5112 Encounter for antineoplastic immunotherapy: Secondary | ICD-10-CM | POA: Diagnosis not present

## 2015-04-24 DIAGNOSIS — C3431 Malignant neoplasm of lower lobe, right bronchus or lung: Secondary | ICD-10-CM

## 2015-04-24 LAB — CBC WITH DIFFERENTIAL/PLATELET
Basophils Absolute: 0 10*3/uL (ref 0–0.1)
Basophils Relative: 0 %
EOS PCT: 0 %
Eosinophils Absolute: 0 10*3/uL (ref 0–0.7)
HCT: 31.4 % — ABNORMAL LOW (ref 40.0–52.0)
Hemoglobin: 10 g/dL — ABNORMAL LOW (ref 13.0–18.0)
LYMPHS ABS: 0.3 10*3/uL — AB (ref 1.0–3.6)
LYMPHS PCT: 4 %
MCH: 28.7 pg (ref 26.0–34.0)
MCHC: 31.8 g/dL — AB (ref 32.0–36.0)
MCV: 90.3 fL (ref 80.0–100.0)
MONO ABS: 0.3 10*3/uL (ref 0.2–1.0)
MONOS PCT: 4 %
Neutro Abs: 7.3 10*3/uL — ABNORMAL HIGH (ref 1.4–6.5)
Neutrophils Relative %: 92 %
PLATELETS: 122 10*3/uL — AB (ref 150–440)
RBC: 3.48 MIL/uL — ABNORMAL LOW (ref 4.40–5.90)
RDW: 17.4 % — AB (ref 11.5–14.5)
WBC: 7.9 10*3/uL (ref 3.8–10.6)

## 2015-04-24 LAB — COMPREHENSIVE METABOLIC PANEL
ALT: 41 U/L (ref 17–63)
AST: 25 U/L (ref 15–41)
Albumin: 4 g/dL (ref 3.5–5.0)
Alkaline Phosphatase: 61 U/L (ref 38–126)
Anion gap: 10 (ref 5–15)
BUN: 28 mg/dL — ABNORMAL HIGH (ref 6–20)
CHLORIDE: 98 mmol/L — AB (ref 101–111)
CO2: 27 mmol/L (ref 22–32)
CREATININE: 1.59 mg/dL — AB (ref 0.61–1.24)
Calcium: 8.6 mg/dL — ABNORMAL LOW (ref 8.9–10.3)
GFR, EST AFRICAN AMERICAN: 45 mL/min — AB (ref >60)
GFR, EST NON AFRICAN AMERICAN: 39 mL/min — AB (ref >60)
Glucose, Bld: 265 mg/dL — ABNORMAL HIGH (ref 65–99)
POTASSIUM: 3.9 mmol/L (ref 3.5–5.1)
Sodium: 135 mmol/L (ref 135–145)
Total Bilirubin: 0.8 mg/dL (ref 0.3–1.2)
Total Protein: 6.6 g/dL (ref 6.5–8.1)

## 2015-04-24 LAB — MAGNESIUM: MAGNESIUM: 1.7 mg/dL (ref 1.7–2.4)

## 2015-04-24 MED ORDER — SODIUM CHLORIDE 0.9 % IV SOLN
Freq: Once | INTRAVENOUS | Status: AC
  Administered 2015-04-24: 15:00:00 via INTRAVENOUS
  Filled 2015-04-24: qty 4

## 2015-04-24 MED ORDER — HEPARIN SOD (PORK) LOCK FLUSH 100 UNIT/ML IV SOLN
500.0000 [IU] | Freq: Once | INTRAVENOUS | Status: AC | PRN
  Administered 2015-04-24: 500 [IU]
  Filled 2015-04-24: qty 5

## 2015-04-24 MED ORDER — SODIUM CHLORIDE 0.9 % IV SOLN
Freq: Once | INTRAVENOUS | Status: AC
  Administered 2015-04-24: 14:00:00 via INTRAVENOUS
  Filled 2015-04-24: qty 1000

## 2015-04-24 MED ORDER — SODIUM CHLORIDE 0.9 % IV SOLN
800.0000 mg/m2 | Freq: Once | INTRAVENOUS | Status: AC
  Administered 2015-04-24: 1672 mg via INTRAVENOUS
  Filled 2015-04-24: qty 44

## 2015-05-04 ENCOUNTER — Other Ambulatory Visit: Payer: Self-pay | Admitting: *Deleted

## 2015-05-04 DIAGNOSIS — C342 Malignant neoplasm of middle lobe, bronchus or lung: Secondary | ICD-10-CM

## 2015-05-06 ENCOUNTER — Other Ambulatory Visit: Payer: Self-pay | Admitting: Oncology

## 2015-05-07 ENCOUNTER — Ambulatory Visit
Admission: RE | Admit: 2015-05-07 | Discharge: 2015-05-07 | Disposition: A | Discharge status: Home / Self Care | Payer: Medicare Other | Source: Ambulatory Visit | Attending: Oncology | Admitting: Oncology

## 2015-05-07 DIAGNOSIS — J90 Pleural effusion, not elsewhere classified: Secondary | ICD-10-CM | POA: Insufficient documentation

## 2015-05-07 DIAGNOSIS — C342 Malignant neoplasm of middle lobe, bronchus or lung: Secondary | ICD-10-CM | POA: Diagnosis not present

## 2015-05-07 DIAGNOSIS — Z0189 Encounter for other specified special examinations: Secondary | ICD-10-CM | POA: Diagnosis present

## 2015-05-07 DIAGNOSIS — C7951 Secondary malignant neoplasm of bone: Secondary | ICD-10-CM | POA: Insufficient documentation

## 2015-05-07 DIAGNOSIS — Z9221 Personal history of antineoplastic chemotherapy: Secondary | ICD-10-CM | POA: Diagnosis not present

## 2015-05-07 DIAGNOSIS — C787 Secondary malignant neoplasm of liver and intrahepatic bile duct: Secondary | ICD-10-CM | POA: Diagnosis not present

## 2015-05-07 LAB — GLUCOSE, CAPILLARY: Glucose-Capillary: 73 mg/dL (ref 65–99)

## 2015-05-07 MED ORDER — FLUDEOXYGLUCOSE F - 18 (FDG) INJECTION
12.5100 | Freq: Once | INTRAVENOUS | Status: AC | PRN
  Administered 2015-05-07: 12.51 via INTRAVENOUS

## 2015-05-08 ENCOUNTER — Inpatient Hospital Stay: Payer: Medicare Other

## 2015-05-08 ENCOUNTER — Inpatient Hospital Stay: Payer: Medicare Other | Attending: Family Medicine | Admitting: Family Medicine

## 2015-05-08 ENCOUNTER — Encounter: Payer: Self-pay | Admitting: Internal Medicine

## 2015-05-08 VITALS — BP 112/65 | HR 88 | Temp 96.8°F | Wt 190.4 lb

## 2015-05-08 DIAGNOSIS — J449 Chronic obstructive pulmonary disease, unspecified: Secondary | ICD-10-CM | POA: Diagnosis not present

## 2015-05-08 DIAGNOSIS — Z794 Long term (current) use of insulin: Secondary | ICD-10-CM | POA: Diagnosis not present

## 2015-05-08 DIAGNOSIS — Z8701 Personal history of pneumonia (recurrent): Secondary | ICD-10-CM | POA: Diagnosis not present

## 2015-05-08 DIAGNOSIS — I714 Abdominal aortic aneurysm, without rupture: Secondary | ICD-10-CM

## 2015-05-08 DIAGNOSIS — Z5111 Encounter for antineoplastic chemotherapy: Secondary | ICD-10-CM | POA: Insufficient documentation

## 2015-05-08 DIAGNOSIS — E7800 Pure hypercholesterolemia, unspecified: Secondary | ICD-10-CM

## 2015-05-08 DIAGNOSIS — Z923 Personal history of irradiation: Secondary | ICD-10-CM

## 2015-05-08 DIAGNOSIS — K8020 Calculus of gallbladder without cholecystitis without obstruction: Secondary | ICD-10-CM | POA: Diagnosis not present

## 2015-05-08 DIAGNOSIS — Z87891 Personal history of nicotine dependence: Secondary | ICD-10-CM | POA: Diagnosis not present

## 2015-05-08 DIAGNOSIS — Z7952 Long term (current) use of systemic steroids: Secondary | ICD-10-CM | POA: Diagnosis not present

## 2015-05-08 DIAGNOSIS — K219 Gastro-esophageal reflux disease without esophagitis: Secondary | ICD-10-CM

## 2015-05-08 DIAGNOSIS — R58 Hemorrhage, not elsewhere classified: Secondary | ICD-10-CM | POA: Insufficient documentation

## 2015-05-08 DIAGNOSIS — K5730 Diverticulosis of large intestine without perforation or abscess without bleeding: Secondary | ICD-10-CM

## 2015-05-08 DIAGNOSIS — Z79899 Other long term (current) drug therapy: Secondary | ICD-10-CM

## 2015-05-08 DIAGNOSIS — Z85828 Personal history of other malignant neoplasm of skin: Secondary | ICD-10-CM

## 2015-05-08 DIAGNOSIS — N189 Chronic kidney disease, unspecified: Secondary | ICD-10-CM | POA: Diagnosis not present

## 2015-05-08 DIAGNOSIS — C801 Malignant (primary) neoplasm, unspecified: Secondary | ICD-10-CM

## 2015-05-08 DIAGNOSIS — C3431 Malignant neoplasm of lower lobe, right bronchus or lung: Secondary | ICD-10-CM

## 2015-05-08 DIAGNOSIS — E041 Nontoxic single thyroid nodule: Secondary | ICD-10-CM | POA: Diagnosis not present

## 2015-05-08 DIAGNOSIS — R531 Weakness: Secondary | ICD-10-CM | POA: Diagnosis not present

## 2015-05-08 DIAGNOSIS — R509 Fever, unspecified: Secondary | ICD-10-CM

## 2015-05-08 DIAGNOSIS — C342 Malignant neoplasm of middle lobe, bronchus or lung: Secondary | ICD-10-CM

## 2015-05-08 DIAGNOSIS — R079 Chest pain, unspecified: Secondary | ICD-10-CM | POA: Diagnosis not present

## 2015-05-08 DIAGNOSIS — R0602 Shortness of breath: Secondary | ICD-10-CM

## 2015-05-08 DIAGNOSIS — J90 Pleural effusion, not elsewhere classified: Secondary | ICD-10-CM | POA: Diagnosis not present

## 2015-05-08 DIAGNOSIS — D696 Thrombocytopenia, unspecified: Secondary | ICD-10-CM | POA: Insufficient documentation

## 2015-05-08 DIAGNOSIS — Z9221 Personal history of antineoplastic chemotherapy: Secondary | ICD-10-CM | POA: Diagnosis not present

## 2015-05-08 DIAGNOSIS — N281 Cyst of kidney, acquired: Secondary | ICD-10-CM

## 2015-05-08 DIAGNOSIS — E119 Type 2 diabetes mellitus without complications: Secondary | ICD-10-CM

## 2015-05-08 DIAGNOSIS — I2510 Atherosclerotic heart disease of native coronary artery without angina pectoris: Secondary | ICD-10-CM | POA: Diagnosis not present

## 2015-05-08 DIAGNOSIS — C3490 Malignant neoplasm of unspecified part of unspecified bronchus or lung: Secondary | ICD-10-CM

## 2015-05-08 DIAGNOSIS — I129 Hypertensive chronic kidney disease with stage 1 through stage 4 chronic kidney disease, or unspecified chronic kidney disease: Secondary | ICD-10-CM | POA: Diagnosis not present

## 2015-05-08 LAB — CBC WITH DIFFERENTIAL/PLATELET
BASOS ABS: 0 10*3/uL (ref 0–0.1)
Basophils Relative: 0 %
EOS PCT: 1 %
Eosinophils Absolute: 0 10*3/uL (ref 0–0.7)
HEMATOCRIT: 26.3 % — AB (ref 40.0–52.0)
Hemoglobin: 8.3 g/dL — ABNORMAL LOW (ref 13.0–18.0)
LYMPHS PCT: 5 %
Lymphs Abs: 0.2 10*3/uL — ABNORMAL LOW (ref 1.0–3.6)
MCH: 29.4 pg (ref 26.0–34.0)
MCHC: 31.7 g/dL — AB (ref 32.0–36.0)
MCV: 92.8 fL (ref 80.0–100.0)
MONO ABS: 0.5 10*3/uL (ref 0.2–1.0)
MONOS PCT: 14 %
Neutro Abs: 2.8 10*3/uL (ref 1.4–6.5)
Neutrophils Relative %: 80 %
PLATELETS: 45 10*3/uL — AB (ref 150–440)
RBC: 2.83 MIL/uL — ABNORMAL LOW (ref 4.40–5.90)
RDW: 18.8 % — AB (ref 11.5–14.5)
WBC: 3.5 10*3/uL — ABNORMAL LOW (ref 3.8–10.6)

## 2015-05-08 LAB — COMPREHENSIVE METABOLIC PANEL
ALT: 22 U/L (ref 17–63)
ANION GAP: 7 (ref 5–15)
AST: 14 U/L — AB (ref 15–41)
Albumin: 3.8 g/dL (ref 3.5–5.0)
Alkaline Phosphatase: 66 U/L (ref 38–126)
BILIRUBIN TOTAL: 0.5 mg/dL (ref 0.3–1.2)
BUN: 23 mg/dL — ABNORMAL HIGH (ref 6–20)
CHLORIDE: 99 mmol/L — AB (ref 101–111)
CO2: 30 mmol/L (ref 22–32)
Calcium: 8.7 mg/dL — ABNORMAL LOW (ref 8.9–10.3)
Creatinine, Ser: 1.56 mg/dL — ABNORMAL HIGH (ref 0.61–1.24)
GFR, EST AFRICAN AMERICAN: 46 mL/min — AB (ref >60)
GFR, EST NON AFRICAN AMERICAN: 40 mL/min — AB (ref >60)
Glucose, Bld: 200 mg/dL — ABNORMAL HIGH (ref 65–99)
POTASSIUM: 3.5 mmol/L (ref 3.5–5.1)
Sodium: 136 mmol/L (ref 135–145)
TOTAL PROTEIN: 6.5 g/dL (ref 6.5–8.1)

## 2015-05-08 MED ORDER — HYDROCOD POLST-CPM POLST ER 10-8 MG/5ML PO SUER: 5.0000 mL | Freq: Two times a day (BID) | ORAL | Status: AC | PRN

## 2015-05-08 MED ORDER — HEPARIN SOD (PORK) LOCK FLUSH 100 UNIT/ML IV SOLN
500.0000 [IU] | Freq: Once | INTRAVENOUS | Status: AC
  Administered 2015-05-08: 500 [IU] via INTRAVENOUS

## 2015-05-08 MED ORDER — HEPARIN SOD (PORK) LOCK FLUSH 100 UNIT/ML IV SOLN
INTRAVENOUS | Status: AC
  Filled 2015-05-08: qty 5

## 2015-05-08 NOTE — Progress Notes (Signed)
Patient requesting refill for Hydrocodone Cough Syrup.  Patient states he coughs a lot at night.  Also c/o runny eyes and congestion.  Patient here today for PET results.

## 2015-05-08 NOTE — Progress Notes (Signed)
[place] @ [facility] [phone]  Fax:([phone]) [place]: 1931-03-15  MR#: [id number]  VOZ#:[id number]  Patient Care Team: Derinda Late, MD as PCP - General (Family Medicine)  CHIEF COMPLAINT:  Chief Complaint  Patient presents with  . Lung Cancer   Oncology History   The patient is an 82 year old gentleman with stage IV squamous cell carcinoma of the lung who is seen for assessment prior to Nivolumab.  1. Squamous cell carcinoma of lung. Bronchoscopies positive for right lower lobe mass. Started on radiation therapy and chemotherapy.  August, 2012 Carboplatinum (AUC of 2) and Taxol with concurrent radiation therapy 2. Finished chemo /  radiation  in sept 2012. 3. Bronchoscopy was negative for any  malignant cells (August, 2013) 4. Continuing hemoptysis.  Repeat bronchoscopy is positive for malignant cells consistent with squamous cell carcinoma. 5. Palliative radiation and chemotherapy with carboplatinum and Taxol.(October, 2013) has finished radiation therapy and Taxol, carboplatinum in November of 2013 6.veristrat test is good 7.on Tarceva 8.progressing disease on Tarceva.  (January, 2015) discontinue Tarceva. 9.started on the investigational protocol with  [facility] February, 2015 10Patient is off protocol and continuing NIVO as. commercial product 11.  Complete evaluation during July 2 016 including thoracentesis cytology was negative for malignant cells 12.  Progressive disease by PET scan criteria.  Suggested chemotherapy with carboplatinum and gemcitabine starting from November of 2016       Oncology Flowsheet 01/26/2015 02/27/2015 03/06/2015 03/27/2015 04/03/2015 04/17/2015 04/24/2015  Day, Cycle - Day 1, Cycle 1 Day 8, Cycle 1 Day 1, Cycle 2 Day 8, Cycle 2 Day 1, Cycle 3 Day 8, Cycle 3  CARBOplatin (PARAPLATIN) IV - 400 mg - 400 mg - 400 mg -  dexamethasone (DECADRON) IV - [ 20 mg ] - [ 20 mg ] [ 4 mg ] [ 20 mg ] [ 4 mg ]  gemcitabine  (GEMZAR) IV - 2,000 mg 600 mg/m2 800 mg/m2 800 mg/m2 800 mg/m2 800 mg/m2  INV-nivolumab (BMS [id number]) IV - - - - - - -  nivolumab (OPDIVO) IV 3 mg/kg - - - - - -  ondansetron (ZOFRAN) IV - [ 16 mg ] - [ 16 mg ] [ 8 mg ] [ 16 mg ] [ 8 mg ]  prochlorperazine (COMPAZINE) PO - - 10 mg - - - -     INTERVAL HISTORY:  Patient is here for further follow-up and treatment consideration regarding recurrent carcinoma of lung. Patient reports having considerable shortness of breath that is chronic in nature, he is not currently using oxygen 100% of the time. He also continues to have some intermittent pain in the chest. Patient was started on carboplatin and gemcitabine in November 2016. He had a PET scan for restaging yesterday.   REVIEW OF SYSTEMS:   Gen. status: Feeling weak and tired low-grade fever cardiac: No chest pain no palpitation  HEENT: No soreness in the marked no difficulty swallowing.   GI: Nausea no vomiting no diarrhea skin: Lungs: Increasing cough.  Shortness of breath.  Low-grade fever.  Cough with expectoration. Abdomen: No diarrhea. Musculoskeletal: no bony pains.  Lower extremity no swelling.  Patient does have senile tremors. Neurological symptoms are as described about Skin: Rash in upper extremity, dry skin HEENT: No soreness in the mouth or difficulty swallowing As per HPI. Otherwise, a complete review of systems is negatve.  PAST MEDICAL HISTORY: Past Medical History  Diagnosis Date  . Hypercholesteremia   . Hypertension   .  Diabetes mellitus without complication ([place])   . AAA (abdominal aortic aneurysm) ([place])   . Lung cancer ([place])     09/2010  . Skin cancer   . CAD (coronary artery disease) stent placed  . COPD (chronic obstructive pulmonary disease) ([place])   . Shortness of breath dyspnea   . Pneumonia   . Chronic kidney disease   . GERD (gastroesophageal reflux disease)   . Headache     PAST SURGICAL HISTORY: Past Surgical History  Procedure Laterality Date    . Abdominal aortic aneurysm repair  2000  . Coronary stent placement  2000  . Tonsillectomy      when he was a child    FAMILY HISTORY Family History  Problem Relation Age of Onset  . Cancer Mother   . Cancer Sister   . Cancer Brother   . Cancer Maternal Aunt     Smoking History quit 10 years ago smoked 2 packs a day(1)  PFSH: Comments: family history of breast cancer and melanoma.  Comments: patient had smoked for 40 years one pack per day, quit smoking 10 years ago.  Does not drink  Additional Past Medical and Surgical History: coronary artery disease with stent placement.    Non-insulin-dependent diabetes.    Hypertension.    Multiple skin cancers removed including melanoma.    Aneurysm repair.  Hypercholesteremia     ADVANCED DIRECTIVES: Patient does have advanced healthcare directive   HEALTH MAINTENANCE: Social History  Substance Use Topics  . Smoking status: Former Smoker    Types: Cigarettes, Cigars    Quit date: 04/29/2003  . Smokeless tobacco: Not on file  . Alcohol Use: No       Allergies  Allergen Reactions  . Penicillin G Rash  . Sulfa Antibiotics Rash    Current Outpatient Prescriptions  Medication Sig Dispense Refill  . ADVAIR DISKUS 250-50 MCG/DOSE AEPB INHALE 1 PUFF TWICE A DAY 60 each 0  . benzonatate (TESSALON) 200 MG capsule Take 200 mg by mouth 3 (three) times daily as needed for cough.    . chlorpheniramine-HYDROcodone (TUSSIONEX PENNKINETIC ER) 10-8 MG/5ML SUER Take 5 mLs by mouth every 12 (twelve) hours as needed for cough. 240 mL 0  . citalopram (CELEXA) 20 MG tablet TAKE 1 TABLET (20 MG TOTAL) BY MOUTH DAILY. 30 tablet 3  . fluticasone (FLONASE) 50 MCG/ACT nasal spray 1 spray. 1 spray in each nostril BID    . furosemide (LASIX) 20 MG tablet Take 1 tablet (20 mg total) by mouth every other day. 30 tablet   . HUMALOG KWIKPEN 100 UNIT/ML KiwkPen CHECK BLOOD SUGAR BEFORE EACH MEAL. INJECT UNDER THE SKIN PER SLIDING SCALE. MAX 30 U PER  DAY  2  . ipratropium-albuterol (DUONEB) 0.5-2.5 (3) MG/3ML SOLN Take 3 mLs by nebulization every 8 (eight) hours as needed.    Marland Kitchen LANTUS SOLOSTAR 100 UNIT/ML Solostar Pen 22 Units. 22 units at bedtime    . levofloxacin (LEVAQUIN) 500 MG tablet Take 1 tablet (500 mg total) by mouth daily. 5 tablet 0  . metoprolol succinate (TOPROL-XL) 100 MG 24 hr tablet Take 100 mg by mouth daily.    Marland Kitchen omeprazole (PRILOSEC) 40 MG capsule TAKE 1 CAPSULE (40 MG TOTAL) BY MOUTH DAILY. 30 capsule 0  . ondansetron (ZOFRAN) 4 MG tablet Take 1 tablet (4 mg total) by mouth every 6 (six) hours as needed for nausea or vomiting. 30 tablet 3  . predniSONE (DELTASONE) 10 MG tablet Start 69m tapers by 125mdaily  to 0. 15 tablet 0  . predniSONE (DELTASONE) 10 MG tablet 1 TAB(S) ORALLY ONCE A DAY 30 tablet 3  . PROAIR HFA 108 (90 BASE) MCG/ACT inhaler INHALE 2 PUFFS 4 TIMES A DAY AS NEEDED 8.5 Inhaler 0  . SPIRIVA HANDIHALER 18 MCG inhalation capsule INHALE 1 CAPSULE WITH HANDIHALER ONCE A DAY 30 capsule 5  . tamsulosin (FLOMAX) 0.4 MG CAPS capsule TAKE ONE CAPSULE BY MOUTH EVERY DAY 30 capsule 7  . Triamcinolone Acetonide (TRIAMCINOLONE 0.1 % CREAM : EUCERIN) CREA Apply 1 application topically daily. 1 each 0   No current facility-administered medications for this visit.   Facility-Administered Medications Ordered in Other Visits  Medication Dose Route Frequency Provider Last Rate Last Dose  . sodium chloride 0.9 % injection 10 mL  10 mL Intracatheter PRN Forest Gleason, MD   10 mL at 09/26/14 1405  . sodium chloride 0.9 % injection 10 mL  10 mL Intracatheter PRN Forest Gleason, MD   10 mL at 02/08/15 1419    OBJECTIVE:  Filed Vitals:   05/08/15 1200  BP: 112/65  Pulse: 88  Temp: 96.8 F (36 C)     Body mass index is 27.32 kg/(m^2).    ECOG FS:1 - Symptomatic but completely ambulatory  PHYSICAL EXAM: General status: Performance status is improving.  HEENT: No evidence of stomatitis. Sclera and conjunctivae : No  jaundice.  Lungs: No oxygen use at this time, on at home PRN. Crepitation right base,  Dullness on percussion on the right side.  Occasional rhonchi on both sides.   Cardiac: Heart sounds are normal.  No pericardial rub.  No murmur. Lymphatic system: Cervical, axillary, inguinal, lymph nodes not palpable GI: Abdomen is soft.  No ascites.  Liver spleen not palpable.  No tenderness.  Bowel sounds are within normal limit Lower extremity: No edema Neurological system: Higher functions, cranial nerves intact Musculoskeletal: within normal limit Skin: Maculopapular rash all over the body   LAB RESULTS:  Appointment on 05/08/2015  Component Date Value Ref Range Status  . WBC 05/08/2015 3.5* 3.8 - 10.6 K/uL Final  . RBC 05/08/2015 2.83* 4.40 - 5.90 MIL/uL Final  . Hemoglobin 05/08/2015 8.3* 13.0 - 18.0 g/dL Final  . HCT 05/08/2015 26.3* 40.0 - 52.0 % Final  . MCV 05/08/2015 92.8  80.0 - 100.0 fL Final  . MCH 05/08/2015 29.4  26.0 - 34.0 pg Final  . MCHC 05/08/2015 31.7* 32.0 - 36.0 g/dL Final  . RDW 05/08/2015 18.8* 11.5 - 14.5 % Final  . Platelets 05/08/2015 45* 150 - 440 K/uL Final  . Neutrophils Relative % 05/08/2015 80   Final  . Neutro Abs 05/08/2015 2.8  1.4 - 6.5 K/uL Final  . Lymphocytes Relative 05/08/2015 5   Final  . Lymphs Abs 05/08/2015 0.2* 1.0 - 3.6 K/uL Final  . Monocytes Relative 05/08/2015 14   Final  . Monocytes Absolute 05/08/2015 0.5  0.2 - 1.0 K/uL Final  . Eosinophils Relative 05/08/2015 1   Final  . Eosinophils Absolute 05/08/2015 0.0  0 - 0.7 K/uL Final  . Basophils Relative 05/08/2015 0   Final  . Basophils Absolute 05/08/2015 0.0  0 - 0.1 K/uL Final  . Sodium 05/08/2015 136  135 - 145 mmol/L Final  . Potassium 05/08/2015 3.5  3.5 - 5.1 mmol/L Final  . Chloride 05/08/2015 99* 101 - 111 mmol/L Final  . CO2 05/08/2015 30  22 - 32 mmol/L Final  . Glucose, Bld 05/08/2015 200* 65 - 99 mg/dL Final  .  BUN 05/08/2015 23* 6 - 20 mg/dL Final  . Creatinine, Ser  05/08/2015 1.56* 0.61 - 1.24 mg/dL Final  . Calcium 05/08/2015 8.7* 8.9 - 10.3 mg/dL Final  . Total Protein 05/08/2015 6.5  6.5 - 8.1 g/dL Final  . Albumin 05/08/2015 3.8  3.5 - 5.0 g/dL Final  . AST 05/08/2015 14* 15 - 41 U/L Final  . ALT 05/08/2015 22  17 - 63 U/L Final  . Alkaline Phosphatase 05/08/2015 66  38 - 126 U/L Final  . Total Bilirubin 05/08/2015 0.5  0.3 - 1.2 mg/dL Final  . GFR calc non Af Amer 05/08/2015 40* >60 mL/min Final  . GFR calc Af Amer 05/08/2015 46* >60 mL/min Final   Comment: (NOTE) The eGFR has been calculated using the CKD EPI equation. This calculation has not been validated in all clinical situations. eGFR's persistently <60 mL/min signify possible Chronic Kidney Disease.   Georgiann Hahn gap 05/08/2015 7  5 - 15 Final  Hospital Outpatient Visit on 05/07/2015  Component Date Value Ref Range Status  . Glucose-Capillary 05/07/2015 73  65 - 99 mg/dL Final      STUDIES: Nm Pet Image Restag (ps) Skull Base To Thigh  05/07/2015  CLINICAL DATA:  Subsequent treatment strategy for poorly differentiated squamous cell lung carcinoma of the right middle lobe diagnosed in July 2012, status post interval chemotherapy. EXAM: NUCLEAR MEDICINE PET SKULL BASE TO THIGH TECHNIQUE: 12.5 mCi F-18 FDG was injected intravenously. Full-ring PET imaging was performed from the skull base to thigh after the radiotracer. CT data was obtained and used for attenuation correction and anatomic localization. FASTING BLOOD GLUCOSE:  Value: 73 mg/dl COMPARISON:  02/16/2015 PET-CT. FINDINGS: NECK No hypermetabolic lymph nodes in the neck. There is asymmetric muscular hypermetabolism in the paraspinal/posterior left neck. Stable non hypermetabolic 1.9 cm hypodense left thyroid lobe nodule. CHEST There is stable volume loss in the right hemithorax. There is a small to moderate right pleural effusion, overall stable in size, with no residual pneumothorax component. There is stable diffuse pleural thickening  and low level hypermetabolism throughout the right pleural space with max SUV 3.3, previously 3.1, not appreciably changed. There is a hypermetabolic 5.9 x 4.6 cm central right lower lung mass (series 3/image 97) involving the central right middle and right lower lobes with max SUV 6.6, significantly decreased in size and metabolism from 8.7 x 5.8 cm with max SUV 15.8 on 02/16/2015 using similar measurement technique. There is significantly decreased hypermetabolism within the invasive left atrial tumor (which is poorly delineated on the noncontrast CT images) with max SUV 4.7, previous max SUV 22.0. There are at least 7 scattered subcentimeter pulmonary nodules in the left lung, largest 0.7 cm in the superior segment left liver lobe (series 3/image 69), which are stable and non hypermetabolic. No acute consolidative airspace disease or new significant pulmonary nodules. No hypermetabolic axillary, mediastinal or hilar nodes. Right internal jugular MediPort terminates in the lower third of the superior vena cava. Coronary atherosclerosis. ABDOMEN/PELVIS No residual hypermetabolism within the previously described segment 4A left liver lobe metastasis. No new hypermetabolic liver masses. No abnormal hypermetabolic activity within the pancreas, adrenal glands, or spleen. No hypermetabolic lymph nodes in the abdomen or pelvis. Cholelithiasis. Bilateral simple appearing renal cysts measuring up to 3.0 cm in the lateral lower right kidney. Marked sigmoid diverticulosis. Stable 3.9 cm infrarenal abdominal aortic aneurysm status post aorta bi-iliac graft repair. SKELETON No residual hypermetabolism within the previously described sacral, bilateral iliac bone or right proximal femoral  skeletal metastases. No new focal hypermetabolic skeletal metastases. IMPRESSION: 1. Significant partial interval treatment response in the hypermetabolic central right lower lung mass. 2. Stable size of small to moderate right pleural effusion  (with no residual pneumothorax component). Stable low level hypermetabolism and pleural thickening throughout the right pleural space, which is nonspecific and could represent malignant or inflammatory activity. 3. Complete metabolic response in the liver metastasis. 4. Complete metabolic response in the pelvic skeletal metastases. 5. No new sites of hypermetabolic metastatic disease. Electronically Signed   By: Ilona Sorrel M.D.   On: 05/07/2015 12:11    ASSESSMENT/ PLAN: 1. Squamous cell carcinoma of the lung, Stage IV disease. Patient had restaging PET scan yesterday which has been reviewed independently and shows significant partial interval response to treatment thus far. 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 Also shows small to moderate right pleural effusion. Patient is feeling fairly well some shortness of breath related to pleural effusion. Patient is due for cycle 4 day 1 carboplatin and gemcitabine today. However, patient's platelets are 46,000 today, he will not receive treatment with carboplatin and gemcitabine. We will bring him back in 1 week to continue chemotherapy with carboplatinum and gemcitabine.  Clinical: Stage IV (T3, N2, M1b) - Signed by Curt Bears, MD on 05/25/2013     Pathologic: No stage assigned - Unsigned   Evlyn Kanner, NP   05/09/2015 10:34 AM

## 2015-05-14 ENCOUNTER — Other Ambulatory Visit: Payer: Self-pay | Admitting: *Deleted

## 2015-05-14 DIAGNOSIS — C3431 Malignant neoplasm of lower lobe, right bronchus or lung: Secondary | ICD-10-CM

## 2015-05-15 ENCOUNTER — Encounter: Payer: Self-pay | Admitting: Oncology

## 2015-05-15 ENCOUNTER — Inpatient Hospital Stay (HOSPITAL_BASED_OUTPATIENT_CLINIC_OR_DEPARTMENT_OTHER): Payer: Medicare Other | Admitting: Oncology

## 2015-05-15 ENCOUNTER — Inpatient Hospital Stay: Payer: Medicare Other

## 2015-05-15 ENCOUNTER — Ambulatory Visit: Payer: Medicare Other

## 2015-05-15 ENCOUNTER — Ambulatory Visit: Payer: Medicare Other | Admitting: Oncology

## 2015-05-15 ENCOUNTER — Other Ambulatory Visit: Payer: Medicare Other

## 2015-05-15 VITALS — BP 101/58 | HR 86 | Temp 97.5°F | Resp 18 | Wt 191.6 lb

## 2015-05-15 DIAGNOSIS — Z79899 Other long term (current) drug therapy: Secondary | ICD-10-CM

## 2015-05-15 DIAGNOSIS — Z7952 Long term (current) use of systemic steroids: Secondary | ICD-10-CM

## 2015-05-15 DIAGNOSIS — C3490 Malignant neoplasm of unspecified part of unspecified bronchus or lung: Secondary | ICD-10-CM

## 2015-05-15 DIAGNOSIS — Z5111 Encounter for antineoplastic chemotherapy: Secondary | ICD-10-CM | POA: Diagnosis not present

## 2015-05-15 DIAGNOSIS — N189 Chronic kidney disease, unspecified: Secondary | ICD-10-CM

## 2015-05-15 DIAGNOSIS — R509 Fever, unspecified: Secondary | ICD-10-CM

## 2015-05-15 DIAGNOSIS — E119 Type 2 diabetes mellitus without complications: Secondary | ICD-10-CM

## 2015-05-15 DIAGNOSIS — K5730 Diverticulosis of large intestine without perforation or abscess without bleeding: Secondary | ICD-10-CM

## 2015-05-15 DIAGNOSIS — R0602 Shortness of breath: Secondary | ICD-10-CM | POA: Diagnosis not present

## 2015-05-15 DIAGNOSIS — I714 Abdominal aortic aneurysm, without rupture: Secondary | ICD-10-CM

## 2015-05-15 DIAGNOSIS — Z794 Long term (current) use of insulin: Secondary | ICD-10-CM

## 2015-05-15 DIAGNOSIS — I2510 Atherosclerotic heart disease of native coronary artery without angina pectoris: Secondary | ICD-10-CM

## 2015-05-15 DIAGNOSIS — J449 Chronic obstructive pulmonary disease, unspecified: Secondary | ICD-10-CM

## 2015-05-15 DIAGNOSIS — C342 Malignant neoplasm of middle lobe, bronchus or lung: Secondary | ICD-10-CM

## 2015-05-15 DIAGNOSIS — Z85828 Personal history of other malignant neoplasm of skin: Secondary | ICD-10-CM

## 2015-05-15 DIAGNOSIS — J90 Pleural effusion, not elsewhere classified: Secondary | ICD-10-CM

## 2015-05-15 DIAGNOSIS — C3431 Malignant neoplasm of lower lobe, right bronchus or lung: Secondary | ICD-10-CM

## 2015-05-15 DIAGNOSIS — Z9221 Personal history of antineoplastic chemotherapy: Secondary | ICD-10-CM

## 2015-05-15 DIAGNOSIS — Z87891 Personal history of nicotine dependence: Secondary | ICD-10-CM

## 2015-05-15 DIAGNOSIS — R531 Weakness: Secondary | ICD-10-CM

## 2015-05-15 DIAGNOSIS — Z8701 Personal history of pneumonia (recurrent): Secondary | ICD-10-CM

## 2015-05-15 DIAGNOSIS — K8020 Calculus of gallbladder without cholecystitis without obstruction: Secondary | ICD-10-CM

## 2015-05-15 DIAGNOSIS — E041 Nontoxic single thyroid nodule: Secondary | ICD-10-CM

## 2015-05-15 DIAGNOSIS — Z923 Personal history of irradiation: Secondary | ICD-10-CM

## 2015-05-15 DIAGNOSIS — K219 Gastro-esophageal reflux disease without esophagitis: Secondary | ICD-10-CM

## 2015-05-15 DIAGNOSIS — R079 Chest pain, unspecified: Secondary | ICD-10-CM

## 2015-05-15 DIAGNOSIS — N281 Cyst of kidney, acquired: Secondary | ICD-10-CM

## 2015-05-15 DIAGNOSIS — I129 Hypertensive chronic kidney disease with stage 1 through stage 4 chronic kidney disease, or unspecified chronic kidney disease: Secondary | ICD-10-CM

## 2015-05-15 DIAGNOSIS — E7800 Pure hypercholesterolemia, unspecified: Secondary | ICD-10-CM

## 2015-05-15 LAB — COMPREHENSIVE METABOLIC PANEL
ALK PHOS: 58 U/L (ref 38–126)
ALT: 20 U/L (ref 17–63)
ANION GAP: 7 (ref 5–15)
AST: 14 U/L — ABNORMAL LOW (ref 15–41)
Albumin: 3.6 g/dL (ref 3.5–5.0)
BUN: 21 mg/dL — ABNORMAL HIGH (ref 6–20)
CALCIUM: 8.6 mg/dL — AB (ref 8.9–10.3)
CO2: 32 mmol/L (ref 22–32)
CREATININE: 1.46 mg/dL — AB (ref 0.61–1.24)
Chloride: 100 mmol/L — ABNORMAL LOW (ref 101–111)
GFR, EST AFRICAN AMERICAN: 50 mL/min — AB (ref >60)
GFR, EST NON AFRICAN AMERICAN: 43 mL/min — AB (ref >60)
Glucose, Bld: 136 mg/dL — ABNORMAL HIGH (ref 65–99)
Potassium: 3.1 mmol/L — ABNORMAL LOW (ref 3.5–5.1)
Sodium: 139 mmol/L (ref 135–145)
Total Bilirubin: 0.9 mg/dL (ref 0.3–1.2)
Total Protein: 6.1 g/dL — ABNORMAL LOW (ref 6.5–8.1)

## 2015-05-15 LAB — CBC WITH DIFFERENTIAL/PLATELET
Basophils Absolute: 0.1 10*3/uL (ref 0–0.1)
Basophils Relative: 1 %
EOS ABS: 0 10*3/uL (ref 0–0.7)
EOS PCT: 1 %
HCT: 28.6 % — ABNORMAL LOW (ref 40.0–52.0)
HEMOGLOBIN: 9.1 g/dL — AB (ref 13.0–18.0)
LYMPHS ABS: 0.5 10*3/uL — AB (ref 1.0–3.6)
LYMPHS PCT: 7 %
MCH: 30 pg (ref 26.0–34.0)
MCHC: 32 g/dL (ref 32.0–36.0)
MCV: 93.7 fL (ref 80.0–100.0)
MONOS PCT: 24 %
Monocytes Absolute: 1.5 10*3/uL — ABNORMAL HIGH (ref 0.2–1.0)
Neutro Abs: 4.1 10*3/uL (ref 1.4–6.5)
Neutrophils Relative %: 67 %
PLATELETS: 176 10*3/uL (ref 150–440)
RBC: 3.05 MIL/uL — AB (ref 4.40–5.90)
RDW: 21.2 % — ABNORMAL HIGH (ref 11.5–14.5)
WBC: 6.1 10*3/uL (ref 3.8–10.6)

## 2015-05-15 LAB — MAGNESIUM: MAGNESIUM: 1.7 mg/dL (ref 1.7–2.4)

## 2015-05-15 MED ORDER — SODIUM CHLORIDE 0.9 % IJ SOLN
10.0000 mL | INTRAMUSCULAR | Status: DC | PRN
  Administered 2015-05-15: 10 mL via INTRAVENOUS
  Filled 2015-05-15: qty 10

## 2015-05-15 MED ORDER — SODIUM CHLORIDE 0.9 % IV SOLN
Freq: Once | INTRAVENOUS | Status: AC
  Administered 2015-05-15: 10:00:00 via INTRAVENOUS
  Filled 2015-05-15: qty 8

## 2015-05-15 MED ORDER — SODIUM CHLORIDE 0.9 % IV SOLN
364.0000 mg | Freq: Once | INTRAVENOUS | Status: AC
  Administered 2015-05-15: 360 mg via INTRAVENOUS
  Filled 2015-05-15: qty 36

## 2015-05-15 MED ORDER — SODIUM CHLORIDE 0.9 % IV SOLN
Freq: Once | INTRAVENOUS | Status: AC
Start: 2015-05-15 — End: 2015-05-15
  Administered 2015-05-15: 10:00:00 via INTRAVENOUS
  Filled 2015-05-15: qty 1000

## 2015-05-15 MED ORDER — HEPARIN SOD (PORK) LOCK FLUSH 100 UNIT/ML IV SOLN
500.0000 [IU] | Freq: Once | INTRAVENOUS | Status: AC
  Administered 2015-05-15: 500 [IU] via INTRAVENOUS
  Filled 2015-05-15: qty 5

## 2015-05-15 MED ORDER — GEMCITABINE HCL CHEMO INJECTION 1 GM/26.3ML
800.0000 mg/m2 | Freq: Once | INTRAVENOUS | Status: AC
  Administered 2015-05-15: 1672 mg via INTRAVENOUS
  Filled 2015-05-15: qty 38.71

## 2015-05-15 NOTE — Progress Notes (Signed)
[place] @ [facility] [phone]  Fax:([phone]) [place]: 30-Nov-1930  MR#: [id number]  UXL#:[id number]  Patient Care Team: Derinda Late, MD as PCP - General (Family Medicine)  CHIEF COMPLAINT:  No chief complaint on file.  Oncology History   The patient is an 82 year old gentleman with stage IV squamous cell carcinoma of the lung who is seen for assessment prior to Nivolumab.  1. Squamous cell carcinoma of lung. Bronchoscopies positive for right lower lobe mass. Started on radiation therapy and chemotherapy.  August, 2012 Carboplatinum (AUC of 2) and Taxol with concurrent radiation therapy 2. Finished chemo /  radiation  in sept 2012. 3. Bronchoscopy was negative for any  malignant cells (August, 2013) 4. Continuing hemoptysis.  Repeat bronchoscopy is positive for malignant cells consistent with squamous cell carcinoma. 5. Palliative radiation and chemotherapy with carboplatinum and Taxol.(October, 2013) has finished radiation therapy and Taxol, carboplatinum in November of 2013 6.veristrat test is good 7.on Tarceva 8.progressing disease on Tarceva.  (January, 2015) discontinue Tarceva. 9.started on the investigational protocol with  [facility] February, 2015 10Patient is off protocol and continuing NIVO as. commercial product 11.  Complete evaluation during July 2 016 including thoracentesis cytology was negative for malignant cells 12.  Progressive disease by PET scan criteria.  Suggested chemotherapy with carboplatinum and gemcitabine starting from November of 2016       Oncology Flowsheet 01/26/2015 02/27/2015 03/06/2015 03/27/2015 04/03/2015 04/17/2015 04/24/2015  Day, Cycle - Day 1, Cycle 1 Day 8, Cycle 1 Day 1, Cycle 2 Day 8, Cycle 2 Day 1, Cycle 3 Day 8, Cycle 3  CARBOplatin (PARAPLATIN) IV - 400 mg - 400 mg - 400 mg -  dexamethasone (DECADRON) IV - [ 20 mg ] - [ 20 mg ] [ 4 mg ] [ 20 mg ] [ 4 mg ]  gemcitabine (GEMZAR) IV - 2,000 mg 600  mg/m2 800 mg/m2 800 mg/m2 800 mg/m2 800 mg/m2  INV-nivolumab (BMS [id number]) IV - - - - - - -  nivolumab (OPDIVO) IV 3 mg/kg - - - - - -  ondansetron (ZOFRAN) IV - [ 16 mg ] - [ 16 mg ] [ 8 mg ] [ 16 mg ] [ 8 mg ]  prochlorperazine (COMPAZINE) PO - - 10 mg - - - -     INTERVAL HISTORY:  Patient is here for further follow-up and treatment consideration regarding recurrent carcinoma of lung. Patient reports having considerable shortness of breath that is chronic in nature, he is not currently using oxygen 100% of the time. He also continues to have some intermittent pain in the chest. Patient was started on carboplatin and gemcitabine in November 2016. He had a PET scan for restaging yesterday.  PET scan has been reviewed independently sows progressive improvement. Patient continues to shortness of breath but not using any oxygen No chills no fever had another biopsy from the face in biopsy is result is pending REVIEW OF SYSTEMS:   Gen. status: Feeling weak and tired low-grade fever cardiac: No chest pain no palpitation  HEENT: No soreness in the marked no difficulty swallowing.   GI: Nausea no vomiting no diarrhea skin: Lungs: Increasing cough.  Shortness of breath.  Low-grade fever.  Cough with expectoration. Abdomen: No diarrhea. Musculoskeletal: no bony pains.  Lower extremity no swelling.  Patient does have senile tremors. Neurological symptoms are as described about Skin: Rash in upper extremity, dry skin HEENT: No soreness in the mouth or difficulty  swallowing As per HPI. Otherwise, a complete review of systems is negatve.  PAST MEDICAL HISTORY: Past Medical History  Diagnosis Date  . Hypercholesteremia   . Hypertension   . Diabetes mellitus without complication ([place])   . AAA (abdominal aortic aneurysm) ([place])   . Lung cancer ([place])     09/2010  . Skin cancer   . CAD (coronary artery disease) stent placed  . COPD (chronic obstructive pulmonary disease) ([place])   . Shortness of  breath dyspnea   . Pneumonia   . Chronic kidney disease   . GERD (gastroesophageal reflux disease)   . Headache     PAST SURGICAL HISTORY: Past Surgical History  Procedure Laterality Date  . Abdominal aortic aneurysm repair  2000  . Coronary stent placement  2000  . Tonsillectomy      when he was a child    FAMILY HISTORY Family History  Problem Relation Age of Onset  . Cancer Mother   . Cancer Sister   . Cancer Brother   . Cancer Maternal Aunt     Smoking History quit 10 years ago smoked 2 packs a day(1)  PFSH: Comments: family history of breast cancer and melanoma.  Comments: patient had smoked for 40 years one pack per day, quit smoking 10 years ago.  Does not drink  Additional Past Medical and Surgical History: coronary artery disease with stent placement.    Non-insulin-dependent diabetes.    Hypertension.    Multiple skin cancers removed including melanoma.    Aneurysm repair.  Hypercholesteremia     ADVANCED DIRECTIVES: Patient does have advanced healthcare directive   HEALTH MAINTENANCE: Social History  Substance Use Topics  . Smoking status: Former Smoker    Types: Cigarettes, Cigars    Quit date: 04/29/2003  . Smokeless tobacco: None  . Alcohol Use: No       Allergies  Allergen Reactions  . Penicillin G Rash  . Sulfa Antibiotics Rash    Current Outpatient Prescriptions  Medication Sig Dispense Refill  . ADVAIR DISKUS 250-50 MCG/DOSE AEPB INHALE 1 PUFF TWICE A DAY 60 each 0  . benzonatate (TESSALON) 200 MG capsule Take 200 mg by mouth 3 (three) times daily as needed for cough.    . chlorpheniramine-HYDROcodone (TUSSIONEX PENNKINETIC ER) 10-8 MG/5ML SUER Take 5 mLs by mouth every 12 (twelve) hours as needed for cough. 240 mL 0  . citalopram (CELEXA) 20 MG tablet TAKE 1 TABLET (20 MG TOTAL) BY MOUTH DAILY. 30 tablet 3  . fluticasone (FLONASE) 50 MCG/ACT nasal spray 1 spray. 1 spray in each nostril BID    . furosemide (LASIX) 20 MG tablet Take 1  tablet (20 mg total) by mouth every other day. 30 tablet   . HUMALOG KWIKPEN 100 UNIT/ML KiwkPen CHECK BLOOD SUGAR BEFORE EACH MEAL. INJECT UNDER THE SKIN PER SLIDING SCALE. MAX 30 U PER DAY  2  . ipratropium-albuterol (DUONEB) 0.5-2.5 (3) MG/3ML SOLN Take 3 mLs by nebulization every 8 (eight) hours as needed.    Marland Kitchen LANTUS SOLOSTAR 100 UNIT/ML Solostar Pen 22 Units. 22 units at bedtime    . levofloxacin (LEVAQUIN) 500 MG tablet Take 1 tablet (500 mg total) by mouth daily. 5 tablet 0  . metoprolol succinate (TOPROL-XL) 100 MG 24 hr tablet Take 100 mg by mouth daily.    Marland Kitchen omeprazole (PRILOSEC) 40 MG capsule TAKE 1 CAPSULE (40 MG TOTAL) BY MOUTH DAILY. 30 capsule 0  . ondansetron (ZOFRAN) 4 MG tablet Take 1 tablet (4 mg  total) by mouth every 6 (six) hours as needed for nausea or vomiting. 30 tablet 3  . predniSONE (DELTASONE) 10 MG tablet Start 65m tapers by 144mdaily to 0. 15 tablet 0  . predniSONE (DELTASONE) 10 MG tablet 1 TAB(S) ORALLY ONCE A DAY 30 tablet 3  . PROAIR HFA 108 (90 BASE) MCG/ACT inhaler INHALE 2 PUFFS 4 TIMES A DAY AS NEEDED 8.5 Inhaler 0  . SPIRIVA HANDIHALER 18 MCG inhalation capsule INHALE 1 CAPSULE WITH HANDIHALER ONCE A DAY 30 capsule 5  . tamsulosin (FLOMAX) 0.4 MG CAPS capsule TAKE ONE CAPSULE BY MOUTH EVERY DAY 30 capsule 7  . Triamcinolone Acetonide (TRIAMCINOLONE 0.1 % CREAM : EUCERIN) CREA Apply 1 application topically daily. 1 each 0   No current facility-administered medications for this visit.   Facility-Administered Medications Ordered in Other Visits  Medication Dose Route Frequency Provider Last Rate Last Dose  . heparin lock flush 100 unit/mL  500 Units Intravenous Once JaForest GleasonMD      . sodium chloride 0.9 % injection 10 mL  10 mL Intracatheter PRN JaForest GleasonMD   10 mL at 09/26/14 1405  . sodium chloride 0.9 % injection 10 mL  10 mL Intracatheter PRN JaForest GleasonMD   10 mL at 02/08/15 1419  . sodium chloride 0.9 % injection 10 mL  10 mL  Intravenous PRN JaForest GleasonMD        OBJECTIVE:  There were no vitals filed for this visit.   There is no weight on file to calculate BMI.    ECOG FS:1 - Symptomatic but completely ambulatory  PHYSICAL EXAM: General status: Performance status is improving.  HEENT: No evidence of stomatitis. Sclera and conjunctivae : No jaundice.  Lungs: No oxygen use at this time, on at home PRN. Crepitation right base,  Dullness on percussion on the right side.  Occasional rhonchi on both sides.   Cardiac: Heart sounds are normal.  No pericardial rub.  No murmur. Lymphatic system: Cervical, axillary, inguinal, lymph nodes not palpable GI: Abdomen is soft.  No ascites.  Liver spleen not palpable.  No tenderness.  Bowel sounds are within normal limit Lower extremity: No edema Neurological system: Higher functions, cranial nerves intact Musculoskeletal: within normal limit Skin: Maculopapular rash all over the body   LAB RESULTS:  No visits with results within 3 Day(s) from this visit. Latest known visit with results is:  Appointment on 05/08/2015  Component Date Value Ref Range Status  . WBC 05/08/2015 3.5* 3.8 - 10.6 K/uL Final  . RBC 05/08/2015 2.83* 4.40 - 5.90 MIL/uL Final  . Hemoglobin 05/08/2015 8.3* 13.0 - 18.0 g/dL Final  . HCT 05/08/2015 26.3* 40.0 - 52.0 % Final  . MCV 05/08/2015 92.8  80.0 - 100.0 fL Final  . MCH 05/08/2015 29.4  26.0 - 34.0 pg Final  . MCHC 05/08/2015 31.7* 32.0 - 36.0 g/dL Final  . RDW 05/08/2015 18.8* 11.5 - 14.5 % Final  . Platelets 05/08/2015 45* 150 - 440 K/uL Final  . Neutrophils Relative % 05/08/2015 80   Final  . Neutro Abs 05/08/2015 2.8  1.4 - 6.5 K/uL Final  . Lymphocytes Relative 05/08/2015 5   Final  . Lymphs Abs 05/08/2015 0.2* 1.0 - 3.6 K/uL Final  . Monocytes Relative 05/08/2015 14   Final  . Monocytes Absolute 05/08/2015 0.5  0.2 - 1.0 K/uL Final  . Eosinophils Relative 05/08/2015 1   Final  . Eosinophils Absolute 05/08/2015 0.0  0 - 0.7  K/uL  Final  . Basophils Relative 05/08/2015 0   Final  . Basophils Absolute 05/08/2015 0.0  0 - 0.1 K/uL Final  . Sodium 05/08/2015 136  135 - 145 mmol/L Final  . Potassium 05/08/2015 3.5  3.5 - 5.1 mmol/L Final  . Chloride 05/08/2015 99* 101 - 111 mmol/L Final  . CO2 05/08/2015 30  22 - 32 mmol/L Final  . Glucose, Bld 05/08/2015 200* 65 - 99 mg/dL Final  . BUN 05/08/2015 23* 6 - 20 mg/dL Final  . Creatinine, Ser 05/08/2015 1.56* 0.61 - 1.24 mg/dL Final  . Calcium 05/08/2015 8.7* 8.9 - 10.3 mg/dL Final  . Total Protein 05/08/2015 6.5  6.5 - 8.1 g/dL Final  . Albumin 05/08/2015 3.8  3.5 - 5.0 g/dL Final  . AST 05/08/2015 14* 15 - 41 U/L Final  . ALT 05/08/2015 22  17 - 63 U/L Final  . Alkaline Phosphatase 05/08/2015 66  38 - 126 U/L Final  . Total Bilirubin 05/08/2015 0.5  0.3 - 1.2 mg/dL Final  . GFR calc non Af Amer 05/08/2015 40* >60 mL/min Final  . GFR calc Af Amer 05/08/2015 46* >60 mL/min Final   Comment: (NOTE) The eGFR has been calculated using the CKD EPI equation. This calculation has not been validated in all clinical situations. eGFR's persistently <60 mL/min signify possible Chronic Kidney Disease.   . Anion gap 05/08/2015 7  5 - 15 Final      STUDIES: Nm Pet Image Restag (ps) Skull Base To Thigh  05/07/2015  CLINICAL DATA:  Subsequent treatment strategy for poorly differentiated squamous cell lung carcinoma of the right middle lobe diagnosed in July 2012, status post interval chemotherapy. EXAM: NUCLEAR MEDICINE PET SKULL BASE TO THIGH TECHNIQUE: 12.5 mCi F-18 FDG was injected intravenously. Full-ring PET imaging was performed from the skull base to thigh after the radiotracer. CT data was obtained and used for attenuation correction and anatomic localization. FASTING BLOOD GLUCOSE:  Value: 73 mg/dl COMPARISON:  02/16/2015 PET-CT. FINDINGS: NECK No hypermetabolic lymph nodes in the neck. There is asymmetric muscular hypermetabolism in the paraspinal/posterior left neck. Stable  non hypermetabolic 1.9 cm hypodense left thyroid lobe nodule. CHEST There is stable volume loss in the right hemithorax. There is a small to moderate right pleural effusion, overall stable in size, with no residual pneumothorax component. There is stable diffuse pleural thickening and low level hypermetabolism throughout the right pleural space with max SUV 3.3, previously 3.1, not appreciably changed. There is a hypermetabolic 5.9 x 4.6 cm central right lower lung mass (series 3/image 97) involving the central right middle and right lower lobes with max SUV 6.6, significantly decreased in size and metabolism from 8.7 x 5.8 cm with max SUV 15.8 on 02/16/2015 using similar measurement technique. There is significantly decreased hypermetabolism within the invasive left atrial tumor (which is poorly delineated on the noncontrast CT images) with max SUV 4.7, previous max SUV 22.0. There are at least 7 scattered subcentimeter pulmonary nodules in the left lung, largest 0.7 cm in the superior segment left liver lobe (series 3/image 69), which are stable and non hypermetabolic. No acute consolidative airspace disease or new significant pulmonary nodules. No hypermetabolic axillary, mediastinal or hilar nodes. Right internal jugular MediPort terminates in the lower third of the superior vena cava. Coronary atherosclerosis. ABDOMEN/PELVIS No residual hypermetabolism within the previously described segment 4A left liver lobe metastasis. No new hypermetabolic liver masses. No abnormal hypermetabolic activity within the pancreas, adrenal glands, or spleen. No hypermetabolic lymph  nodes in the abdomen or pelvis. Cholelithiasis. Bilateral simple appearing renal cysts measuring up to 3.0 cm in the lateral lower right kidney. Marked sigmoid diverticulosis. Stable 3.9 cm infrarenal abdominal aortic aneurysm status post aorta bi-iliac graft repair. SKELETON No residual hypermetabolism within the previously described sacral, bilateral  iliac bone or right proximal femoral skeletal metastases. No new focal hypermetabolic skeletal metastases. IMPRESSION: 1. Significant partial interval treatment response in the hypermetabolic central right lower lung mass. 2. Stable size of small to moderate right pleural effusion (with no residual pneumothorax component). Stable low level hypermetabolism and pleural thickening throughout the right pleural space, which is nonspecific and could represent malignant or inflammatory activity. 3. Complete metabolic response in the liver metastasis. 4. Complete metabolic response in the pelvic skeletal metastases. 5. No new sites of hypermetabolic metastatic disease. Electronically Signed   By: Ilona Sorrel M.D.   On: 05/07/2015 12:11    ASSESSMENT/ PLAN: 1. Squamous cell carcinoma of the lung, Stage IV disease. Patient had restaging PET scan yesterday which has been reviewed independently and shows significant partial interval response to treatment thus far. 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  Scan has been reviewed independently.  Continue chemotherapy with carboplatinum and gemcitabine  I  Encouraged  use of oxygen patient is ambulating Clinical: Stage IV (T3, N2, M1b) - Signed by Curt Bears, MD on 05/25/2013     Pathologic: No stage assigned - Marni Griffon, MD   05/15/2015 8:21 AM

## 2015-05-22 ENCOUNTER — Inpatient Hospital Stay (HOSPITAL_BASED_OUTPATIENT_CLINIC_OR_DEPARTMENT_OTHER): Payer: Medicare Other | Admitting: Oncology

## 2015-05-22 ENCOUNTER — Encounter: Payer: Self-pay | Admitting: Oncology

## 2015-05-22 ENCOUNTER — Inpatient Hospital Stay: Payer: Medicare Other

## 2015-05-22 VITALS — BP 126/66 | HR 90 | Temp 96.6°F | Resp 18 | Wt 190.7 lb

## 2015-05-22 DIAGNOSIS — C3431 Malignant neoplasm of lower lobe, right bronchus or lung: Secondary | ICD-10-CM

## 2015-05-22 DIAGNOSIS — I2510 Atherosclerotic heart disease of native coronary artery without angina pectoris: Secondary | ICD-10-CM

## 2015-05-22 DIAGNOSIS — N281 Cyst of kidney, acquired: Secondary | ICD-10-CM

## 2015-05-22 DIAGNOSIS — Z79899 Other long term (current) drug therapy: Secondary | ICD-10-CM

## 2015-05-22 DIAGNOSIS — R0602 Shortness of breath: Secondary | ICD-10-CM

## 2015-05-22 DIAGNOSIS — Z87891 Personal history of nicotine dependence: Secondary | ICD-10-CM

## 2015-05-22 DIAGNOSIS — N189 Chronic kidney disease, unspecified: Secondary | ICD-10-CM

## 2015-05-22 DIAGNOSIS — K219 Gastro-esophageal reflux disease without esophagitis: Secondary | ICD-10-CM

## 2015-05-22 DIAGNOSIS — E041 Nontoxic single thyroid nodule: Secondary | ICD-10-CM

## 2015-05-22 DIAGNOSIS — R079 Chest pain, unspecified: Secondary | ICD-10-CM

## 2015-05-22 DIAGNOSIS — Z9221 Personal history of antineoplastic chemotherapy: Secondary | ICD-10-CM

## 2015-05-22 DIAGNOSIS — J449 Chronic obstructive pulmonary disease, unspecified: Secondary | ICD-10-CM

## 2015-05-22 DIAGNOSIS — Z8701 Personal history of pneumonia (recurrent): Secondary | ICD-10-CM

## 2015-05-22 DIAGNOSIS — E119 Type 2 diabetes mellitus without complications: Secondary | ICD-10-CM

## 2015-05-22 DIAGNOSIS — I714 Abdominal aortic aneurysm, without rupture: Secondary | ICD-10-CM

## 2015-05-22 DIAGNOSIS — D696 Thrombocytopenia, unspecified: Secondary | ICD-10-CM | POA: Diagnosis not present

## 2015-05-22 DIAGNOSIS — Z85828 Personal history of other malignant neoplasm of skin: Secondary | ICD-10-CM

## 2015-05-22 DIAGNOSIS — K8020 Calculus of gallbladder without cholecystitis without obstruction: Secondary | ICD-10-CM

## 2015-05-22 DIAGNOSIS — J90 Pleural effusion, not elsewhere classified: Secondary | ICD-10-CM

## 2015-05-22 DIAGNOSIS — I129 Hypertensive chronic kidney disease with stage 1 through stage 4 chronic kidney disease, or unspecified chronic kidney disease: Secondary | ICD-10-CM

## 2015-05-22 DIAGNOSIS — E7800 Pure hypercholesterolemia, unspecified: Secondary | ICD-10-CM

## 2015-05-22 DIAGNOSIS — K5730 Diverticulosis of large intestine without perforation or abscess without bleeding: Secondary | ICD-10-CM

## 2015-05-22 DIAGNOSIS — Z923 Personal history of irradiation: Secondary | ICD-10-CM

## 2015-05-22 DIAGNOSIS — R509 Fever, unspecified: Secondary | ICD-10-CM

## 2015-05-22 DIAGNOSIS — Z7952 Long term (current) use of systemic steroids: Secondary | ICD-10-CM

## 2015-05-22 DIAGNOSIS — Z5111 Encounter for antineoplastic chemotherapy: Secondary | ICD-10-CM | POA: Diagnosis not present

## 2015-05-22 DIAGNOSIS — Z794 Long term (current) use of insulin: Secondary | ICD-10-CM

## 2015-05-22 DIAGNOSIS — R531 Weakness: Secondary | ICD-10-CM

## 2015-05-22 LAB — CBC WITH DIFFERENTIAL/PLATELET
Basophils Absolute: 0 10*3/uL (ref 0–0.1)
Basophils Relative: 1 %
EOS PCT: 0 %
Eosinophils Absolute: 0 10*3/uL (ref 0–0.7)
HCT: 26.5 % — ABNORMAL LOW (ref 40.0–52.0)
Hemoglobin: 8.4 g/dL — ABNORMAL LOW (ref 13.0–18.0)
LYMPHS ABS: 0.2 10*3/uL — AB (ref 1.0–3.6)
LYMPHS PCT: 4 %
MCH: 30.2 pg (ref 26.0–34.0)
MCHC: 31.9 g/dL — AB (ref 32.0–36.0)
MCV: 94.6 fL (ref 80.0–100.0)
MONO ABS: 0.3 10*3/uL (ref 0.2–1.0)
MONOS PCT: 5 %
Neutro Abs: 4.4 10*3/uL (ref 1.4–6.5)
Neutrophils Relative %: 90 %
PLATELETS: 67 10*3/uL — AB (ref 150–440)
RBC: 2.8 MIL/uL — AB (ref 4.40–5.90)
RDW: 19.4 % — AB (ref 11.5–14.5)
WBC: 4.9 10*3/uL (ref 3.8–10.6)

## 2015-05-22 MED ORDER — HEPARIN SOD (PORK) LOCK FLUSH 100 UNIT/ML IV SOLN
500.0000 [IU] | Freq: Once | INTRAVENOUS | Status: AC
  Administered 2015-05-22: 500 [IU] via INTRAVENOUS

## 2015-05-22 MED ORDER — SODIUM CHLORIDE 0.9 % IV SOLN: 600.0000 mg/m2 | Freq: Once | INTRAVENOUS | Status: DC

## 2015-05-22 MED ORDER — SODIUM CHLORIDE 0.9 % IV SOLN
Freq: Once | INTRAVENOUS | Status: AC
  Administered 2015-05-22: 15:00:00 via INTRAVENOUS
  Filled 2015-05-22: qty 4

## 2015-05-22 MED ORDER — SODIUM CHLORIDE 0.9 % IV SOLN
1200.0000 mg | Freq: Once | INTRAVENOUS | Status: AC
  Administered 2015-05-22: 1216.73 mg via INTRAVENOUS
  Filled 2015-05-22: qty 5.7

## 2015-05-22 MED ORDER — SODIUM CHLORIDE 0.9 % IJ SOLN
10.0000 mL | INTRAMUSCULAR | Status: DC | PRN
Start: 2015-05-22 — End: 2015-05-22
  Administered 2015-05-22: 10 mL via INTRAVENOUS
  Filled 2015-05-22: qty 10

## 2015-05-22 MED ORDER — HEPARIN SOD (PORK) LOCK FLUSH 100 UNIT/ML IV SOLN
INTRAVENOUS | Status: AC
  Filled 2015-05-22: qty 5

## 2015-05-22 MED ORDER — SODIUM CHLORIDE 0.9 % IV SOLN
Freq: Once | INTRAVENOUS | Status: AC
  Administered 2015-05-22: 15:00:00 via INTRAVENOUS
  Filled 2015-05-22: qty 1000

## 2015-05-22 MED ORDER — TOBRAMYCIN-DEXAMETHASONE 0.3-0.1 % OP OINT: 1.0000 "application " | TOPICAL_OINTMENT | Freq: Two times a day (BID) | OPHTHALMIC | Status: DC

## 2015-05-22 NOTE — Progress Notes (Signed)
[place] @ [facility] [phone]  Fax:([phone]) [place]: Jan 09, 1931  MR#: [id number]  JYN#:[id number]  Patient Care Team: Derinda Late, MD as PCP - General (Family Medicine)  CHIEF COMPLAINT:  Chief Complaint  Patient presents with  . Lung Cancer   Oncology History   The patient is an 82 year old gentleman with stage IV squamous cell carcinoma of the lung who is seen for assessment prior to Nivolumab.  1. Squamous cell carcinoma of lung. Bronchoscopies positive for right lower lobe mass. Started on radiation therapy and chemotherapy.  August, 2012 Carboplatinum (AUC of 2) and Taxol with concurrent radiation therapy 2. Finished chemo /  radiation  in sept 2012. 3. Bronchoscopy was negative for any  malignant cells (August, 2013) 4. Continuing hemoptysis.  Repeat bronchoscopy is positive for malignant cells consistent with squamous cell carcinoma. 5. Palliative radiation and chemotherapy with carboplatinum and Taxol.(October, 2013) has finished radiation therapy and Taxol, carboplatinum in November of 2013 6.veristrat test is good 7.on Tarceva 8.progressing disease on Tarceva.  (January, 2015) discontinue Tarceva. 9.started on the investigational protocol with  [facility] February, 2015 10Patient is off protocol and continuing [employer] as. commercial product 11.  Complete evaluation during July 2 016 including thoracentesis cytology was negative for malignant cells 12.  Progressive disease by PET scan criteria.  Suggested chemotherapy with carboplatinum and gemcitabine starting from November of 2016       Oncology Flowsheet 02/27/2015 03/06/2015 03/27/2015 04/03/2015 04/17/2015 04/24/2015 05/15/2015  Day, Cycle Day 1, Cycle 1 Day 8, Cycle 1 Day 1, Cycle 2 Day 8, Cycle 2 Day 1, Cycle 3 Day 8, Cycle 3 Day 1, Cycle 4  CARBOplatin (PARAPLATIN) IV 400 mg - 400 mg - 400 mg - 360 mg  dexamethasone (DECADRON) IV [ 20 mg ] - [ 20 mg ] [ 4 mg ] [ 20 mg ] [ 4 mg  ] [ 20 mg ]  gemcitabine (GEMZAR) IV 2,000 mg 600 mg/m2 800 mg/m2 800 mg/m2 800 mg/m2 800 mg/m2 800 mg/m2  INV-nivolumab (BMS [id number]) IV - - - - - - -  nivolumab (OPDIVO) IV - - - - - - -  ondansetron (ZOFRAN) IV [ 16 mg ] - [ 16 mg ] [ 8 mg ] [ 16 mg ] [ 8 mg ] [ 16 mg ]  prochlorperazine (COMPAZINE) PO - 10 mg - - - - -     INTERVAL HISTORY:  82 year old gentleman with a history of non-small cell carcinoma of lung came today further follow-up continues to have dry hacking cough particularly during the night.  Appetite has been poor.  Has not lost any weight.  No nausea.  No vomiting.  No diarrhea.  Patient is here for ongoing evaluation and continuation of treatment.  Continues to ahead tremors REVIEW OF SYSTEMS:   Gen. status: Feeling weak and tired low-grade fever cardiac: No chest pain no palpitation  HEENT: No soreness in the marked no difficulty swallowing.   GI: Nausea no vomiting no diarrhea skin: Lungs: Increasing cough.  Shortness of breath.  Low-grade fever.  Cough with expectoration. Abdomen: No diarrhea. Musculoskeletal: no bony pains.  Lower extremity no swelling.  Patient does have senile tremors. Neurological symptoms are as described about Skin: Rash in upper extremity, dry skin HEENT: No soreness in the mouth or difficulty swallowing As per HPI. Otherwise, a complete review of systems is negatve.  PAST MEDICAL HISTORY: Past Medical History  Diagnosis Date  . Hypercholesteremia   .  Hypertension   . Diabetes mellitus without complication ([place])   . AAA (abdominal aortic aneurysm) ([place])   . Lung cancer ([place])     09/2010  . Skin cancer   . CAD (coronary artery disease) stent placed  . COPD (chronic obstructive pulmonary disease) ([place])   . Shortness of breath dyspnea   . Pneumonia   . Chronic kidney disease   . GERD (gastroesophageal reflux disease)   . Headache     PAST SURGICAL HISTORY: Past Surgical History  Procedure Laterality Date  . Abdominal aortic  aneurysm repair  2000  . Coronary stent placement  2000  . Tonsillectomy      when he was a child    FAMILY HISTORY Family History  Problem Relation Age of Onset  . Cancer Mother   . Cancer Sister   . Cancer Brother   . Cancer Maternal Aunt     Smoking History quit 10 years ago smoked 2 packs a day(1)  PFSH: Comments: family history of breast cancer and melanoma.  Comments: patient had smoked for 40 years one pack per day, quit smoking 10 years ago.  Does not drink  Additional Past Medical and Surgical History: coronary artery disease with stent placement.    Non-insulin-dependent diabetes.    Hypertension.    Multiple skin cancers removed including melanoma.    Aneurysm repair.  Hypercholesteremia     ADVANCED DIRECTIVES: Patient does have advanced healthcare directive   HEALTH MAINTENANCE: Social History  Substance Use Topics  . Smoking status: Former Smoker    Types: Cigarettes, Cigars    Quit date: 04/29/2003  . Smokeless tobacco: None  . Alcohol Use: No       Allergies  Allergen Reactions  . Penicillin G Rash  . Sulfa Antibiotics Rash    Current Outpatient Prescriptions  Medication Sig Dispense Refill  . ADVAIR DISKUS 250-50 MCG/DOSE AEPB INHALE 1 PUFF TWICE A DAY 60 each 0  . benzonatate (TESSALON) 200 MG capsule Take 200 mg by mouth 3 (three) times daily as needed for cough.    . chlorpheniramine-HYDROcodone (TUSSIONEX PENNKINETIC ER) 10-8 MG/5ML SUER Take 5 mLs by mouth every 12 (twelve) hours as needed for cough. 240 mL 0  . citalopram (CELEXA) 20 MG tablet TAKE 1 TABLET (20 MG TOTAL) BY MOUTH DAILY. 30 tablet 3  . fluticasone (FLONASE) 50 MCG/ACT nasal spray 1 spray. 1 spray in each nostril BID    . furosemide (LASIX) 20 MG tablet Take 1 tablet (20 mg total) by mouth every other day. 30 tablet   . HUMALOG KWIKPEN 100 UNIT/ML KiwkPen CHECK BLOOD SUGAR BEFORE EACH MEAL. INJECT UNDER THE SKIN PER SLIDING SCALE. MAX 30 U PER DAY  2  .  ipratropium-albuterol (DUONEB) 0.5-2.5 (3) MG/3ML SOLN Take 3 mLs by nebulization every 8 (eight) hours as needed.    Marland Kitchen LANTUS SOLOSTAR 100 UNIT/ML Solostar Pen 22 Units. 22 units at bedtime    . levofloxacin (LEVAQUIN) 500 MG tablet Take 1 tablet (500 mg total) by mouth daily. 5 tablet 0  . metoprolol succinate (TOPROL-XL) 100 MG 24 hr tablet Take 100 mg by mouth daily.    Marland Kitchen omeprazole (PRILOSEC) 40 MG capsule TAKE 1 CAPSULE (40 MG TOTAL) BY MOUTH DAILY. 30 capsule 0  . ondansetron (ZOFRAN) 4 MG tablet Take 1 tablet (4 mg total) by mouth every 6 (six) hours as needed for nausea or vomiting. 30 tablet 3  . predniSONE (DELTASONE) 10 MG tablet Start '50mg'$  tapers by '10mg'$   daily to 0. 15 tablet 0  . predniSONE (DELTASONE) 10 MG tablet 1 TAB(S) ORALLY ONCE A DAY 30 tablet 3  . PROAIR HFA 108 (90 BASE) MCG/ACT inhaler INHALE 2 PUFFS 4 TIMES A DAY AS NEEDED 8.5 Inhaler 0  . SPIRIVA HANDIHALER 18 MCG inhalation capsule INHALE 1 CAPSULE WITH HANDIHALER ONCE A DAY 30 capsule 5  . tamsulosin (FLOMAX) 0.4 MG CAPS capsule TAKE ONE CAPSULE BY MOUTH EVERY DAY 30 capsule 7  . Triamcinolone Acetonide (TRIAMCINOLONE 0.1 % CREAM : EUCERIN) CREA Apply 1 application topically daily. 1 each 0  . tobramycin-dexamethasone (TOBRADEX) ophthalmic ointment Place 1 application into both eyes 2 (two) times daily. 3.5 g 0   No current facility-administered medications for this visit.   Facility-Administered Medications Ordered in Other Visits  Medication Dose Route Frequency Provider Last Rate Last Dose  . 0.9 %  sodium chloride infusion   Intravenous Once Evlyn Kanner, NP      . gemcitabine (GEMZAR) 1,216.73 mg in sodium chloride 0.9 % 100 mL chemo infusion  1,216.73 mg Intravenous Once Forest Gleason, MD      . heparin lock flush 100 unit/mL  500 Units Intravenous Once Forest Gleason, MD      . ondansetron (ZOFRAN) 8 mg, dexamethasone (DECADRON) 4 mg in sodium chloride 0.9 % 50 mL IVPB   Intravenous Once Evlyn Kanner, NP       . sodium chloride 0.9 % injection 10 mL  10 mL Intracatheter PRN Forest Gleason, MD   10 mL at 09/26/14 1405  . sodium chloride 0.9 % injection 10 mL  10 mL Intracatheter PRN Forest Gleason, MD   10 mL at 02/08/15 1419  . sodium chloride 0.9 % injection 10 mL  10 mL Intravenous PRN Forest Gleason, MD   10 mL at 05/22/15 1335    OBJECTIVE:  Filed Vitals:   05/22/15 1356  BP: 126/66  Pulse: 90  Temp: 96.6 F (35.9 C)  Resp: 18     Body mass index is 27.36 kg/(m^2).    ECOG FS:1 - Symptomatic but completely ambulatory  PHYSICAL EXAM: General status: Performance status is improving.  HEENT: No evidence of stomatitis. Sclera and conjunctivae : No jaundice.  Lungs: No oxygen use at this time, on at home PRN. Crepitation right base,  Dullness on percussion on the right side.  Occasional rhonchi on both sides.   Cardiac: Heart sounds are normal.  No pericardial rub.  No murmur. Lymphatic system: Cervical, axillary, inguinal, lymph nodes not palpable GI: Abdomen is soft.  No ascites.  Liver spleen not palpable.  No tenderness.  Bowel sounds are within normal limit Lower extremity: No edema Neurological system: Higher functions, cranial nerves intact Musculoskeletal: within normal limit Skin: Maculopapular rash all over the body   LAB RESULTS:  Infusion on 05/22/2015  Component Date Value Ref Range Status  . WBC 05/22/2015 4.9  3.8 - 10.6 K/uL Final  . RBC 05/22/2015 2.80* 4.40 - 5.90 MIL/uL Final  . Hemoglobin 05/22/2015 8.4* 13.0 - 18.0 g/dL Final  . HCT 05/22/2015 26.5* 40.0 - 52.0 % Final  . MCV 05/22/2015 94.6  80.0 - 100.0 fL Final  . MCH 05/22/2015 30.2  26.0 - 34.0 pg Final  . MCHC 05/22/2015 31.9* 32.0 - 36.0 g/dL Final  . RDW 05/22/2015 19.4* 11.5 - 14.5 % Final  . Platelets 05/22/2015 67* 150 - 440 K/uL Final  . Neutrophils Relative % 05/22/2015 90   Final  . Neutro Abs 05/22/2015  4.4  1.4 - 6.5 K/uL Final  . Lymphocytes Relative 05/22/2015 4   Final  . Lymphs Abs  05/22/2015 0.2* 1.0 - 3.6 K/uL Final  . Monocytes Relative 05/22/2015 5   Final  . Monocytes Absolute 05/22/2015 0.3  0.2 - 1.0 K/uL Final  . Eosinophils Relative 05/22/2015 0   Final  . Eosinophils Absolute 05/22/2015 0.0  0 - 0.7 K/uL Final  . Basophils Relative 05/22/2015 1   Final  . Basophils Absolute 05/22/2015 0.0  0 - 0.1 K/uL Final      STUDIES: Nm Pet Image Restag (ps) Skull Base To Thigh  05/07/2015  CLINICAL DATA:  Subsequent treatment strategy for poorly differentiated squamous cell lung carcinoma of the right middle lobe diagnosed in July 2012, status post interval chemotherapy. EXAM: NUCLEAR MEDICINE PET SKULL BASE TO THIGH TECHNIQUE: 12.5 mCi F-18 FDG was injected intravenously. Full-ring PET imaging was performed from the skull base to thigh after the radiotracer. CT data was obtained and used for attenuation correction and anatomic localization. FASTING BLOOD GLUCOSE:  Value: 73 mg/dl COMPARISON:  02/16/2015 PET-CT. FINDINGS: NECK No hypermetabolic lymph nodes in the neck. There is asymmetric muscular hypermetabolism in the paraspinal/posterior left neck. Stable non hypermetabolic 1.9 cm hypodense left thyroid lobe nodule. CHEST There is stable volume loss in the right hemithorax. There is a small to moderate right pleural effusion, overall stable in size, with no residual pneumothorax component. There is stable diffuse pleural thickening and low level hypermetabolism throughout the right pleural space with max SUV 3.3, previously 3.1, not appreciably changed. There is a hypermetabolic 5.9 x 4.6 cm central right lower lung mass (series 3/image 97) involving the central right middle and right lower lobes with max SUV 6.6, significantly decreased in size and metabolism from 8.7 x 5.8 cm with max SUV 15.8 on 02/16/2015 using similar measurement technique. There is significantly decreased hypermetabolism within the invasive left atrial tumor (which is poorly delineated on the noncontrast CT  images) with max SUV 4.7, previous max SUV 22.0. There are at least 7 scattered subcentimeter pulmonary nodules in the left lung, largest 0.7 cm in the superior segment left liver lobe (series 3/image 69), which are stable and non hypermetabolic. No acute consolidative airspace disease or new significant pulmonary nodules. No hypermetabolic axillary, mediastinal or hilar nodes. Right internal jugular MediPort terminates in the lower third of the superior vena cava. Coronary atherosclerosis. ABDOMEN/PELVIS No residual hypermetabolism within the previously described segment 4A left liver lobe metastasis. No new hypermetabolic liver masses. No abnormal hypermetabolic activity within the pancreas, adrenal glands, or spleen. No hypermetabolic lymph nodes in the abdomen or pelvis. Cholelithiasis. Bilateral simple appearing renal cysts measuring up to 3.0 cm in the lateral lower right kidney. Marked sigmoid diverticulosis. Stable 3.9 cm infrarenal abdominal aortic aneurysm status post aorta bi-iliac graft repair. SKELETON No residual hypermetabolism within the previously described sacral, bilateral iliac bone or right proximal femoral skeletal metastases. No new focal hypermetabolic skeletal metastases. IMPRESSION: 1. Significant partial interval treatment response in the hypermetabolic central right lower lung mass. 2. Stable size of small to moderate right pleural effusion (with no residual pneumothorax component). Stable low level hypermetabolism and pleural thickening throughout the right pleural space, which is nonspecific and could represent malignant or inflammatory activity. 3. Complete metabolic response in the liver metastasis. 4. Complete metabolic response in the pelvic skeletal metastases. 5. No new sites of hypermetabolic metastatic disease. Electronically Signed   By: Ilona Sorrel M.D.   On: 05/07/2015 12:11  ASSESSMENT/ PLAN: 1. Squamous cell carcinoma of the lung, Stage IV disease. Patient had  restaging PET scan yesterday which has been reviewed independently and shows significant partial interval response to treatment thus far. 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  Scan has been reviewed independently.  Continue chemotherapy with carboplatinum and gemcitabine  Dose of gemcitabine has been reduced because of thrombocytopenia.  2.  Platelet count is 67,000 most likely secondary to gemcitabine chemotherapy  I  Encouraged  use of oxygen patient is ambulating Clinical: Stage IV (T3, N2, M1b) - Signed by Curt Bears, MD on 05/25/2013     Pathologic: No stage assigned - Marni Griffon, MD   05/22/2015 3:04 PM

## 2015-05-22 NOTE — Progress Notes (Signed)
Patient states it is hard for him to breathe.  O2 94%.  Also states his eyes water all the time.

## 2015-05-22 NOTE — Progress Notes (Signed)
Plt =67 MD reduced dose of gemzar from '800mg'$ /m2 to '600mg'$ /m2

## 2015-05-25 ENCOUNTER — Other Ambulatory Visit: Payer: Self-pay | Admitting: Oncology

## 2015-05-29 ENCOUNTER — Encounter: Payer: Self-pay | Admitting: Oncology

## 2015-05-29 ENCOUNTER — Inpatient Hospital Stay: Payer: Medicare Other | Admitting: *Deleted

## 2015-05-29 ENCOUNTER — Other Ambulatory Visit: Payer: Self-pay | Admitting: Family Medicine

## 2015-05-29 ENCOUNTER — Inpatient Hospital Stay: Payer: Medicare Other

## 2015-05-29 ENCOUNTER — Other Ambulatory Visit: Payer: Self-pay | Admitting: *Deleted

## 2015-05-29 ENCOUNTER — Inpatient Hospital Stay (HOSPITAL_BASED_OUTPATIENT_CLINIC_OR_DEPARTMENT_OTHER): Payer: Medicare Other | Admitting: Oncology

## 2015-05-29 VITALS — BP 123/73 | HR 84 | Temp 97.7°F | Resp 18

## 2015-05-29 VITALS — BP 94/56 | HR 84 | Temp 96.9°F | Resp 18 | Wt 192.2 lb

## 2015-05-29 DIAGNOSIS — D696 Thrombocytopenia, unspecified: Secondary | ICD-10-CM

## 2015-05-29 DIAGNOSIS — R531 Weakness: Secondary | ICD-10-CM

## 2015-05-29 DIAGNOSIS — J449 Chronic obstructive pulmonary disease, unspecified: Secondary | ICD-10-CM

## 2015-05-29 DIAGNOSIS — Z87891 Personal history of nicotine dependence: Secondary | ICD-10-CM

## 2015-05-29 DIAGNOSIS — R58 Hemorrhage, not elsewhere classified: Secondary | ICD-10-CM

## 2015-05-29 DIAGNOSIS — Z7952 Long term (current) use of systemic steroids: Secondary | ICD-10-CM

## 2015-05-29 DIAGNOSIS — R079 Chest pain, unspecified: Secondary | ICD-10-CM

## 2015-05-29 DIAGNOSIS — R0602 Shortness of breath: Secondary | ICD-10-CM

## 2015-05-29 DIAGNOSIS — N189 Chronic kidney disease, unspecified: Secondary | ICD-10-CM

## 2015-05-29 DIAGNOSIS — N281 Cyst of kidney, acquired: Secondary | ICD-10-CM

## 2015-05-29 DIAGNOSIS — I129 Hypertensive chronic kidney disease with stage 1 through stage 4 chronic kidney disease, or unspecified chronic kidney disease: Secondary | ICD-10-CM

## 2015-05-29 DIAGNOSIS — R509 Fever, unspecified: Secondary | ICD-10-CM

## 2015-05-29 DIAGNOSIS — Z79899 Other long term (current) drug therapy: Secondary | ICD-10-CM

## 2015-05-29 DIAGNOSIS — Z923 Personal history of irradiation: Secondary | ICD-10-CM

## 2015-05-29 DIAGNOSIS — E119 Type 2 diabetes mellitus without complications: Secondary | ICD-10-CM

## 2015-05-29 DIAGNOSIS — Z8701 Personal history of pneumonia (recurrent): Secondary | ICD-10-CM

## 2015-05-29 DIAGNOSIS — J90 Pleural effusion, not elsewhere classified: Secondary | ICD-10-CM

## 2015-05-29 DIAGNOSIS — K8020 Calculus of gallbladder without cholecystitis without obstruction: Secondary | ICD-10-CM

## 2015-05-29 DIAGNOSIS — Z85828 Personal history of other malignant neoplasm of skin: Secondary | ICD-10-CM

## 2015-05-29 DIAGNOSIS — I714 Abdominal aortic aneurysm, without rupture: Secondary | ICD-10-CM

## 2015-05-29 DIAGNOSIS — Z794 Long term (current) use of insulin: Secondary | ICD-10-CM

## 2015-05-29 DIAGNOSIS — I2510 Atherosclerotic heart disease of native coronary artery without angina pectoris: Secondary | ICD-10-CM

## 2015-05-29 DIAGNOSIS — C3431 Malignant neoplasm of lower lobe, right bronchus or lung: Secondary | ICD-10-CM

## 2015-05-29 DIAGNOSIS — E7800 Pure hypercholesterolemia, unspecified: Secondary | ICD-10-CM

## 2015-05-29 DIAGNOSIS — Z5111 Encounter for antineoplastic chemotherapy: Secondary | ICD-10-CM | POA: Diagnosis not present

## 2015-05-29 DIAGNOSIS — E041 Nontoxic single thyroid nodule: Secondary | ICD-10-CM

## 2015-05-29 DIAGNOSIS — K5730 Diverticulosis of large intestine without perforation or abscess without bleeding: Secondary | ICD-10-CM

## 2015-05-29 DIAGNOSIS — C342 Malignant neoplasm of middle lobe, bronchus or lung: Secondary | ICD-10-CM

## 2015-05-29 DIAGNOSIS — Z9221 Personal history of antineoplastic chemotherapy: Secondary | ICD-10-CM

## 2015-05-29 LAB — CBC WITH DIFFERENTIAL/PLATELET
BASOS ABS: 0 10*3/uL (ref 0–0.1)
Eosinophils Absolute: 0 10*3/uL (ref 0–0.7)
Eosinophils Relative: 0 %
HEMATOCRIT: 23.7 % — AB (ref 40.0–52.0)
HEMOGLOBIN: 7.6 g/dL — AB (ref 13.0–18.0)
Lymphs Abs: 0.3 10*3/uL — ABNORMAL LOW (ref 1.0–3.6)
MCH: 30.3 pg (ref 26.0–34.0)
MCHC: 32.1 g/dL (ref 32.0–36.0)
MCV: 94.5 fL (ref 80.0–100.0)
Monocytes Absolute: 0.1 10*3/uL — ABNORMAL LOW (ref 0.2–1.0)
Monocytes Relative: 6 %
NEUTROS ABS: 1.7 10*3/uL (ref 1.4–6.5)
Platelets: 11 10*3/uL — CL (ref 150–440)
RBC: 2.5 MIL/uL — AB (ref 4.40–5.90)
RDW: 18.8 % — ABNORMAL HIGH (ref 11.5–14.5)
WBC: 2.1 10*3/uL — ABNORMAL LOW (ref 3.8–10.6)

## 2015-05-29 LAB — COMPREHENSIVE METABOLIC PANEL
ALBUMIN: 3.6 g/dL (ref 3.5–5.0)
ALK PHOS: 56 U/L (ref 38–126)
ALT: 48 U/L (ref 17–63)
AST: 29 U/L (ref 15–41)
Anion gap: 6 (ref 5–15)
BILIRUBIN TOTAL: 0.9 mg/dL (ref 0.3–1.2)
BUN: 22 mg/dL — AB (ref 6–20)
CO2: 31 mmol/L (ref 22–32)
CREATININE: 1.53 mg/dL — AB (ref 0.61–1.24)
Calcium: 8.8 mg/dL — ABNORMAL LOW (ref 8.9–10.3)
Chloride: 103 mmol/L (ref 101–111)
GFR calc Af Amer: 47 mL/min — ABNORMAL LOW (ref >60)
GFR, EST NON AFRICAN AMERICAN: 41 mL/min — AB (ref >60)
GLUCOSE: 110 mg/dL — AB (ref 65–99)
POTASSIUM: 3.6 mmol/L (ref 3.5–5.1)
Sodium: 140 mmol/L (ref 135–145)
TOTAL PROTEIN: 6.2 g/dL — AB (ref 6.5–8.1)

## 2015-05-29 LAB — SAMPLE TO BLOOD BANK

## 2015-05-29 LAB — PREPARE RBC (CROSSMATCH)

## 2015-05-29 LAB — ABO/RH: ABO/RH(D): A POS

## 2015-05-29 MED ORDER — SODIUM CHLORIDE 0.9 % IV SOLN
250.0000 mL | Freq: Once | INTRAVENOUS | Status: AC
  Administered 2015-05-29: 250 mL via INTRAVENOUS
  Filled 2015-05-29: qty 250

## 2015-05-29 MED ORDER — HEPARIN SOD (PORK) LOCK FLUSH 100 UNIT/ML IV SOLN
500.0000 [IU] | Freq: Once | INTRAVENOUS | Status: AC
  Administered 2015-05-29: 500 [IU] via INTRAVENOUS

## 2015-05-29 NOTE — Progress Notes (Signed)
Progress Notes   [name] [name] (MR# [id number])      Progress Notes Info    Author Note Status Last Update User Last Update Date/Time   Evlyn Kanner, NP Incomplete Evlyn Kanner, NP 05/29/2015 2:49 PM    Progress Notes    Expand All Collapse All    [place]  [phone] [phone]     [name] [name] DOB: Sep 11, 1930 MR#: [id number] ERD#:[id number]  Patient Care Team: Derinda Late, MD as PCP - General (Family Medicine)  CHIEF COMPLAINT:  Chief Complaint  Patient presents with  . Lung Cancer    INTERVAL HISTORY:  Patient is being seen as an acute add-on today regarding extremely easy bruising in the upper extremities. Patient was here for a lab check only following chemotherapy with [place] and gemcitabine for treatment of non-small cell carcinoma of the lung. He also reports having some mild increase in shortness of breath. He has oxygen at home but does not use outside of the home. He denies any fever, chills, rectal bleeding, hemoptysis, or other acute issues.  REVIEW OF SYSTEMS:  Review of Systems  Constitutional: Positive for malaise/fatigue. Negative for fever, chills, weight loss and diaphoresis.  HENT: Negative.  Eyes: Negative.  Respiratory: Positive for shortness of breath. Negative for cough, hemoptysis, sputum production and wheezing.  Cardiovascular: Negative for chest pain, palpitations, orthopnea, claudication, leg swelling and PND.  Gastrointestinal: Negative for heartburn, nausea, vomiting, abdominal pain, diarrhea, constipation, blood in stool and melena.  Genitourinary: Negative.  Musculoskeletal: Negative.  Skin: Negative.  Neurological: Negative for dizziness, tingling, focal weakness, seizures and weakness.  Endo/Heme/Allergies: Bruises/bleeds easily.  Psychiatric/Behavioral: Negative for depression. The patient is not nervous/anxious and does not have insomnia.    As per HPI. Otherwise,  a complete review of systems is negatve.  ONCOLOGY HISTORY: Oncology History   The patient is an 82 year old gentleman with stage IV squamous cell carcinoma of the lung who is seen for assessment prior to Nivolumab.  1. Squamous cell carcinoma of lung. Bronchoscopies positive for right lower lobe mass. Started on radiation therapy and chemotherapy. August, 2012 Carboplatinum (AUC of 2) and Taxol with concurrent radiation therapy 2. Finished chemo / radiation in sept 2012. 3. Bronchoscopy was negative for any malignant cells (August, 2013) 4. Continuing hemoptysis. Repeat bronchoscopy is positive for malignant cells consistent with squamous cell carcinoma. 5. Palliative radiation and chemotherapy with carboplatinum and Taxol.(October, 2013) has finished radiation therapy and Taxol, carboplatinum in November of 2013 6.veristrat test is good 7.on Tarceva 8.progressing disease on Tarceva. (January, 2015) discontinue Tarceva. 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 Complete evaluation during July 2 016 including thoracentesis cytology was negative for malignant cells 12. Progressive disease by PET scan criteria. Suggested chemotherapy with carboplatinum and gemcitabine starting from November of 2016.     Lung cancer ([place])   12/24/2010 Initial Diagnosis Lung cancer    PAST MEDICAL HISTORY: Past Medical History  Diagnosis Date  . Hypercholesteremia   . Hypertension   . Diabetes mellitus without complication ([place])   . AAA (abdominal aortic aneurysm) ([place])   . Lung cancer ([place])     09/2010  . Skin cancer   . CAD (coronary artery disease) stent placed  . COPD (chronic obstructive pulmonary disease) ([place])   . Shortness of breath dyspnea   . Pneumonia   . Chronic kidney disease   . GERD (gastroesophageal  reflux disease)     . Headache     PAST SURGICAL HISTORY: Past Surgical History  Procedure Laterality Date  . Abdominal aortic aneurysm repair  2000  . Coronary stent placement  2000  . Tonsillectomy      when he was a child    FAMILY HISTORY Family History  Problem Relation Age of Onset  . Cancer Mother   . Cancer Sister   . Cancer Brother   . Cancer Maternal Aunt     GYNECOLOGIC HISTORY: No LMP for male patient.    ADVANCED DIRECTIVES:    HEALTH MAINTENANCE: Social History  Substance Use Topics  . Smoking status: Former Smoker    Types: Cigarettes, Cigars    Quit date: 04/29/2003  . Smokeless tobacco: None  . Alcohol Use: No    Colonoscopy: PAP: Bone density: Lipid panel:  Allergies  Allergen Reactions  . Penicillin G Rash  . Sulfa Antibiotics Rash    Current Outpatient Prescriptions  Medication Sig Dispense Refill  . ADVAIR DISKUS 250-50 MCG/DOSE AEPB INHALE 1 PUFF TWICE A DAY 60 each 0  . benzonatate (TESSALON) 200 MG capsule Take 200 mg by mouth 3 (three) times daily as needed for cough.    . chlorpheniramine-HYDROcodone (TUSSIONEX PENNKINETIC ER) 10-8 MG/5ML SUER Take 5 mLs by mouth every 12 (twelve) hours as needed for cough. 240 mL 0  . citalopram (CELEXA) 20 MG tablet TAKE 1 TABLET (20 MG TOTAL) BY MOUTH DAILY. 30 tablet 3  . fluticasone (FLONASE) 50 MCG/ACT nasal spray 1 spray. 1 spray in each nostril BID    . furosemide (LASIX) 20 MG tablet Take 1 tablet (20 mg total) by mouth every other day. 30 tablet   . HUMALOG KWIKPEN 100 UNIT/ML KiwkPen CHECK BLOOD SUGAR BEFORE EACH MEAL. INJECT UNDER THE SKIN PER SLIDING SCALE. MAX 30 U PER DAY  2  . ipratropium-albuterol (DUONEB) 0.5-2.5 (3) MG/3ML SOLN Take 3 mLs by nebulization every 8 (eight) hours as needed.    Marland Kitchen LANTUS SOLOSTAR 100 UNIT/ML  Solostar Pen 22 Units. 22 units at bedtime    . levofloxacin (LEVAQUIN) 500 MG tablet Take 1 tablet (500 mg total) by mouth daily. 5 tablet 0  . metoprolol succinate (TOPROL-XL) 100 MG 24 hr tablet Take 100 mg by mouth daily.    Marland Kitchen omeprazole (PRILOSEC) 40 MG capsule TAKE 1 CAPSULE (40 MG TOTAL) BY MOUTH DAILY. 30 capsule 0  . ondansetron (ZOFRAN) 4 MG tablet Take 1 tablet (4 mg total) by mouth every 6 (six) hours as needed for nausea or vomiting. 30 tablet 3  . predniSONE (DELTASONE) 10 MG tablet Start 45m tapers by 12mdaily to 0. 15 tablet 0  . predniSONE (DELTASONE) 10 MG tablet 1 TAB(S) ORALLY ONCE A DAY 30 tablet 3  . PROAIR HFA 108 (90 BASE) MCG/ACT inhaler INHALE 2 PUFFS 4 TIMES A DAY AS NEEDED 8.5 Inhaler 0  . SPIRIVA HANDIHALER 18 MCG inhalation capsule INHALE 1 CAPSULE WITH HANDIHALER ONCE A DAY 30 capsule 5  . tamsulosin (FLOMAX) 0.4 MG CAPS capsule TAKE ONE CAPSULE BY MOUTH EVERY DAY 30 capsule 7  . tobramycin-dexamethasone (TOBRADEX) ophthalmic ointment Place 1 application into both eyes 2 (two) times daily. 3.5 g 0  . Triamcinolone Acetonide (TRIAMCINOLONE 0.1 % CREAM : EUCERIN) CREA Apply 1 application topically daily. 1 each 0   No current facility-administered medications for this visit.   Facility-Administered Medications Ordered in Other Visits  Medication Dose Route Frequency Provider Last Rate Last Dose  .  0.9 % sodium chloride infusion 250 mL Intravenous Once Evlyn Kanner, NP    . sodium chloride 0.9 % injection 10 mL 10 mL Intracatheter PRN Forest Gleason, MD  10 mL at 09/26/14 1405  . sodium chloride 0.9 % injection 10 mL 10 mL Intracatheter PRN Forest Gleason, MD  10 mL at 02/08/15 1419    OBJECTIVE: BP 94/56 mmHg  Pulse 84  Temp(Src) 96.9 F (36.1 C) (Tympanic)  Resp 18  Wt 192 lb 4 oz (87.204 kg) Body mass index is 27.58 kg/(m^2). ECOG FS:2 -  Symptomatic, <50% confined to bed  General: Well-developed, well-nourished, no acute distress. Eyes: Pink conjunctiva, anicteric sclera. HEENT: Normocephalic, moist mucous membranes, clear oropharnyx. Lungs: Clear to auscultation bilaterally. Heart: Regular rate and rhythm. No rubs, murmurs, or gallops. Musculoskeletal: No edema, cyanosis, or clubbing. Neuro: Alert, answering all questions appropriately. Cranial nerves grossly intact. Skin: Scattered ecchymosis. Left hand and right upper arm with severe discoloration due to bruising. Psych: Normal affect.    LAB RESULTS:  Clinical Support on 05/29/2015  Component Date Value Ref Range Status  . WBC 05/29/2015 2.1* 3.8 - 10.6 K/uL Final  . RBC 05/29/2015 2.50* 4.40 - 5.90 MIL/uL Final  . Hemoglobin 05/29/2015 7.6* 13.0 - 18.0 g/dL Final  . HCT 05/29/2015 23.7* 40.0 - 52.0 % Final  . MCV 05/29/2015 94.5  80.0 - 100.0 fL Final  . MCH 05/29/2015 30.3  26.0 - 34.0 pg Final  . MCHC 05/29/2015 32.1  32.0 - 36.0 g/dL Final  . RDW 05/29/2015 18.8* 11.5 - 14.5 % Final  . Platelets 05/29/2015 11* 150 - 440 K/uL Final   Comment: PLATELET COUNT CONFIRMED BY SMEAR PLATELETS APPEAR DECREASED CRITICAL RESULT CALLED TO, READ BACK BY AND VERIFIED WITH: NYO TO LESLIE HERRING,NP 05/29/15 1143   . Neutrophils Relative % 05/29/2015 81%   Final  . Neutro Abs 05/29/2015 1.7  1.4 - 6.5 K/uL Final  . Lymphocytes Relative 05/29/2015 13%   Final  . Lymphs Abs 05/29/2015 0.3* 1.0 - 3.6 K/uL Final  . Monocytes Relative 05/29/2015 6%   Final  . Monocytes Absolute 05/29/2015 0.1* 0.2 - 1.0 K/uL Final  . Eosinophils Relative 05/29/2015 0%   Final  . Eosinophils Absolute 05/29/2015 0.0  0 - 0.7 K/uL Final  . Basophils Relative 05/29/2015 0%   Final  . Basophils Absolute 05/29/2015 0.0  0 - 0.1 K/uL Final  . Sodium 05/29/2015 140  135  - 145 mmol/L Final  . Potassium 05/29/2015 3.6  3.5 - 5.1 mmol/L Final  . Chloride 05/29/2015 103  101 - 111 mmol/L Final  . CO2 05/29/2015 31  22 - 32 mmol/L Final  . Glucose, Bld 05/29/2015 110* 65 - 99 mg/dL Final  . BUN 05/29/2015 22* 6 - 20 mg/dL Final  . Creatinine, Ser 05/29/2015 1.53* 0.61 - 1.24 mg/dL Final  . Calcium 05/29/2015 8.8* 8.9 - 10.3 mg/dL Final  . Total Protein 05/29/2015 6.2* 6.5 - 8.1 g/dL Final  . Albumin 05/29/2015 3.6  3.5 - 5.0 g/dL Final  . AST 05/29/2015 29  15 - 41 U/L Final  . ALT 05/29/2015 48  17 - 63 U/L Final  . Alkaline Phosphatase 05/29/2015 56  38 - 126 U/L Final  . Total Bilirubin 05/29/2015 0.9  0.3 - 1.2 mg/dL Final  . GFR calc non Af Amer 05/29/2015 41* >60 mL/min Final  . GFR calc Af Amer 05/29/2015 47* >60 mL/min Final   Comment: (NOTE) The eGFR has been calculated using  the CKD EPI equation. This calculation has not been validated in all clinical situations. eGFR's persistently <60 mL/min signify possible Chronic Kidney Disease.   . Anion gap 05/29/2015 6  5 - 15 Final  . Blood Bank Specimen 05/29/2015 SAMPLE AVAILABLE FOR TESTING   Final  . Sample Expiration 05/29/2015 06/01/2015   Final    STUDIES: No results found.  ASSESSMENT:  Squamous cell carcinoma of the lung, stage IV disease. Thrombocytopenia.  PLAN:  1. Lung CA. Patient had restaging PET scan in early January which shows significant partial interval response to treatment thus far. 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 He is currently on chemotherapy with carboplatin and dose reduced gemcitabine, secondary to thrombocytopenia. Encouraged patient to use oxygen all the time and increase to 3 L if needed. 2. Anemia. Secondary to chemotherapy. Hemoglobin today is 7.6 and patient is having increasing shortness of breath  as well as fatigue. We will schedule for 1 unit of packed red blood cells for tomorrow, Wednesday, 05/30/2015. 3. Thrombocytopenia. Platelets today were 11,000, also secondary to chemotherapy. We'll proceed with obtaining 1 unit of platelets today for transfusion. Patient will return tomorrow for recheck of CBC and for possible second unit of platelets if necessary.  Patient expressed understanding and was in agreement with this plan. He also understands that He can call clinic at any time with any questions, concerns, or complaints.   Dr. Oliva Bustard was available for consultation and review of plan of care for this patient.  Lung cancer ([place])  Staging form: Lung, AJCC 7th Edition  Clinical: Stage IV (T3, N2, M1b) - Signed by Curt Bears, MD on 05/25/2013  Pathologic: No stage assigned - Unsigned   Evlyn Kanner, NP 05/29/2015 2:04 PM  Attending's note Patient was examined by me.  All the findings were confirmed. Thrombocytopenia is secondary to gemcitabine chemotherapy Will transfuse patient 1 unit of platelet recheck platelet count tomorrow Hemoglobin is low but transfuse 1 unit of packed cells A platelet count is low tomorrow less than 20 then another unit of platelet could be transfused

## 2015-05-29 NOTE — Progress Notes (Signed)
Patient here today as acute add on for dizziness and right arm that is bruised.  Patient states he slept on his arm last night and this morning when he woke up it was bruised.  Further states his SOB is worsening.

## 2015-05-29 NOTE — Progress Notes (Signed)
[place]  [phone]  [phone]     [name] [name] DOB: 15-Nov-1930  MR#: [id number]  MVH#:[id number]  Patient Care Team: Derinda Late, MD as PCP - General (Family Medicine)  CHIEF COMPLAINT:  Chief Complaint  Patient presents with  . Lung Cancer    INTERVAL HISTORY:  Patient is being seen as an acute add-on today regarding extremely easy bruising in the upper extremities. Patient was here for a lab check only following chemotherapy with [place] and gemcitabine for treatment of non-small cell carcinoma of the lung. He also reports having some mild increase in shortness of breath. He has oxygen at home but does not use outside of the home. He denies any fever, chills, rectal bleeding, hemoptysis, or other acute issues.  REVIEW OF SYSTEMS:   Review of Systems  Constitutional: Positive for malaise/fatigue. Negative for fever, chills, weight loss and diaphoresis.  HENT: Negative.   Eyes: Negative.   Respiratory: Positive for shortness of breath. Negative for cough, hemoptysis, sputum production and wheezing.   Cardiovascular: Negative for chest pain, palpitations, orthopnea, claudication, leg swelling and PND.  Gastrointestinal: Negative for heartburn, nausea, vomiting, abdominal pain, diarrhea, constipation, blood in stool and melena.  Genitourinary: Negative.   Musculoskeletal: Negative.   Skin: Negative.   Neurological: Negative for dizziness, tingling, focal weakness, seizures and weakness.  Endo/Heme/Allergies: Bruises/bleeds easily.  Psychiatric/Behavioral: Negative for depression. The patient is not nervous/anxious and does not have insomnia.     As per HPI. Otherwise, a complete review of systems is negatve.  ONCOLOGY HISTORY: Oncology History   The patient is an 82 year old gentleman with stage IV squamous cell carcinoma of the lung who is seen for assessment prior to Nivolumab.  1. Squamous cell carcinoma of lung. Bronchoscopies  positive for right lower lobe mass. Started on radiation therapy and chemotherapy.  August, 2012 Carboplatinum (AUC of 2) and Taxol with concurrent radiation therapy 2. Finished chemo /  radiation  in sept 2012. 3. Bronchoscopy was negative for any  malignant cells (August, 2013) 4. Continuing hemoptysis.  Repeat bronchoscopy is positive for malignant cells consistent with squamous cell carcinoma. 5. Palliative radiation and chemotherapy with carboplatinum and Taxol.(October, 2013) has finished radiation therapy and Taxol, carboplatinum in November of 2013 6.veristrat test is good 7.on Tarceva 8.progressing disease on Tarceva.  (January, 2015) discontinue Tarceva. 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  Complete evaluation during July 2 016 including thoracentesis cytology was negative for malignant cells 12.  Progressive disease by PET scan criteria.  Suggested chemotherapy with carboplatinum and gemcitabine starting from November of 2016.     Lung cancer ([place])   12/24/2010 Initial Diagnosis Lung cancer    PAST MEDICAL HISTORY: Past Medical History  Diagnosis Date  . Hypercholesteremia   . Hypertension   . Diabetes mellitus without complication ([place])   . AAA (abdominal aortic aneurysm) ([place])   . Lung cancer ([place])     09/2010  . Skin cancer   . CAD (coronary artery disease) stent placed  . COPD (chronic obstructive pulmonary disease) ([place])   . Shortness of breath dyspnea   . Pneumonia   . Chronic kidney disease   . GERD (gastroesophageal reflux disease)   . Headache     PAST SURGICAL HISTORY: Past Surgical History  Procedure Laterality Date  . Abdominal aortic aneurysm repair  2000  . Coronary stent placement  2000  .  Tonsillectomy      when he was a child    FAMILY HISTORY Family History  Problem Relation Age of Onset  . Cancer Mother   . Cancer Sister   .  Cancer Brother   . Cancer Maternal Aunt     GYNECOLOGIC HISTORY:  No LMP for male patient.     ADVANCED DIRECTIVES:    HEALTH MAINTENANCE: Social History  Substance Use Topics  . Smoking status: Former Smoker    Types: Cigarettes, Cigars    Quit date: 04/29/2003  . Smokeless tobacco: None  . Alcohol Use: No     Colonoscopy:  PAP:  Bone density:  Lipid panel:  Allergies  Allergen Reactions  . Penicillin G Rash  . Sulfa Antibiotics Rash    Current Outpatient Prescriptions  Medication Sig Dispense Refill  . ADVAIR DISKUS 250-50 MCG/DOSE AEPB INHALE 1 PUFF TWICE A DAY 60 each 0  . benzonatate (TESSALON) 200 MG capsule Take 200 mg by mouth 3 (three) times daily as needed for cough.    . chlorpheniramine-HYDROcodone (TUSSIONEX PENNKINETIC ER) 10-8 MG/5ML SUER Take 5 mLs by mouth every 12 (twelve) hours as needed for cough. 240 mL 0  . citalopram (CELEXA) 20 MG tablet TAKE 1 TABLET (20 MG TOTAL) BY MOUTH DAILY. 30 tablet 3  . fluticasone (FLONASE) 50 MCG/ACT nasal spray 1 spray. 1 spray in each nostril BID    . furosemide (LASIX) 20 MG tablet Take 1 tablet (20 mg total) by mouth every other day. 30 tablet   . HUMALOG KWIKPEN 100 UNIT/ML KiwkPen CHECK BLOOD SUGAR BEFORE EACH MEAL. INJECT UNDER THE SKIN PER SLIDING SCALE. MAX 30 U PER DAY  2  . ipratropium-albuterol (DUONEB) 0.5-2.5 (3) MG/3ML SOLN Take 3 mLs by nebulization every 8 (eight) hours as needed.    Marland Kitchen LANTUS SOLOSTAR 100 UNIT/ML Solostar Pen 22 Units. 22 units at bedtime    . levofloxacin (LEVAQUIN) 500 MG tablet Take 1 tablet (500 mg total) by mouth daily. 5 tablet 0  . metoprolol succinate (TOPROL-XL) 100 MG 24 hr tablet Take 100 mg by mouth daily.    Marland Kitchen omeprazole (PRILOSEC) 40 MG capsule TAKE 1 CAPSULE (40 MG TOTAL) BY MOUTH DAILY. 30 capsule 0  . ondansetron (ZOFRAN) 4 MG tablet Take 1 tablet (4 mg total) by mouth every 6 (six) hours as needed for nausea or vomiting. 30 tablet 3  . predniSONE (DELTASONE) 10 MG  tablet Start 67m tapers by 160mdaily to 0. 15 tablet 0  . predniSONE (DELTASONE) 10 MG tablet 1 TAB(S) ORALLY ONCE A DAY 30 tablet 3  . PROAIR HFA 108 (90 BASE) MCG/ACT inhaler INHALE 2 PUFFS 4 TIMES A DAY AS NEEDED 8.5 Inhaler 0  . SPIRIVA HANDIHALER 18 MCG inhalation capsule INHALE 1 CAPSULE WITH HANDIHALER ONCE A DAY 30 capsule 5  . tamsulosin (FLOMAX) 0.4 MG CAPS capsule TAKE ONE CAPSULE BY MOUTH EVERY DAY 30 capsule 7  . tobramycin-dexamethasone (TOBRADEX) ophthalmic ointment Place 1 application into both eyes 2 (two) times daily. 3.5 g 0  . Triamcinolone Acetonide (TRIAMCINOLONE 0.1 % CREAM : EUCERIN) CREA Apply 1 application topically daily. 1 each 0   No current facility-administered medications for this visit.   Facility-Administered Medications Ordered in Other Visits  Medication Dose Route Frequency Provider Last Rate Last Dose  . 0.9 %  sodium chloride infusion  250 mL Intravenous Once LeEvlyn KannerNP      . sodium chloride 0.9 % injection 10 mL  10  mL Intracatheter PRN Forest Gleason, MD   10 mL at 09/26/14 1405  . sodium chloride 0.9 % injection 10 mL  10 mL Intracatheter PRN Forest Gleason, MD   10 mL at 02/08/15 1419    OBJECTIVE: BP 94/56 mmHg  Pulse 84  Temp(Src) 96.9 F (36.1 C) (Tympanic)  Resp 18  Wt 192 lb 4 oz (87.204 kg)   Body mass index is 27.58 kg/(m^2).    ECOG FS:2 - Symptomatic, <50% confined to bed  General: Well-developed, well-nourished, no acute distress. Eyes: Pink conjunctiva, anicteric sclera. HEENT: Normocephalic, moist mucous membranes, clear oropharnyx. Lungs: Clear to auscultation bilaterally. Heart: Regular rate and rhythm. No rubs, murmurs, or gallops. Musculoskeletal: No edema, cyanosis, or clubbing. Neuro: Alert, answering all questions appropriately. Cranial nerves grossly intact. Skin: Scattered ecchymosis. Left hand and right upper arm with severe discoloration due to bruising. Psych: Normal affect.    LAB RESULTS:  Clinical  Support on 05/29/2015  Component Date Value Ref Range Status  . WBC 05/29/2015 2.1* 3.8 - 10.6 K/uL Final  . RBC 05/29/2015 2.50* 4.40 - 5.90 MIL/uL Final  . Hemoglobin 05/29/2015 7.6* 13.0 - 18.0 g/dL Final  . HCT 05/29/2015 23.7* 40.0 - 52.0 % Final  . MCV 05/29/2015 94.5  80.0 - 100.0 fL Final  . MCH 05/29/2015 30.3  26.0 - 34.0 pg Final  . MCHC 05/29/2015 32.1  32.0 - 36.0 g/dL Final  . RDW 05/29/2015 18.8* 11.5 - 14.5 % Final  . Platelets 05/29/2015 11* 150 - 440 K/uL Final   Comment: PLATELET COUNT CONFIRMED BY SMEAR PLATELETS APPEAR DECREASED CRITICAL RESULT CALLED TO, READ BACK BY AND VERIFIED WITH: NYO TO LESLIE HERRING,NP 05/29/15 1143   . Neutrophils Relative % 05/29/2015 81%   Final  . Neutro Abs 05/29/2015 1.7  1.4 - 6.5 K/uL Final  . Lymphocytes Relative 05/29/2015 13%   Final  . Lymphs Abs 05/29/2015 0.3* 1.0 - 3.6 K/uL Final  . Monocytes Relative 05/29/2015 6%   Final  . Monocytes Absolute 05/29/2015 0.1* 0.2 - 1.0 K/uL Final  . Eosinophils Relative 05/29/2015 0%   Final  . Eosinophils Absolute 05/29/2015 0.0  0 - 0.7 K/uL Final  . Basophils Relative 05/29/2015 0%   Final  . Basophils Absolute 05/29/2015 0.0  0 - 0.1 K/uL Final  . Sodium 05/29/2015 140  135 - 145 mmol/L Final  . Potassium 05/29/2015 3.6  3.5 - 5.1 mmol/L Final  . Chloride 05/29/2015 103  101 - 111 mmol/L Final  . CO2 05/29/2015 31  22 - 32 mmol/L Final  . Glucose, Bld 05/29/2015 110* 65 - 99 mg/dL Final  . BUN 05/29/2015 22* 6 - 20 mg/dL Final  . Creatinine, Ser 05/29/2015 1.53* 0.61 - 1.24 mg/dL Final  . Calcium 05/29/2015 8.8* 8.9 - 10.3 mg/dL Final  . Total Protein 05/29/2015 6.2* 6.5 - 8.1 g/dL Final  . Albumin 05/29/2015 3.6  3.5 - 5.0 g/dL Final  . AST 05/29/2015 29  15 - 41 U/L Final  . ALT 05/29/2015 48  17 - 63 U/L Final  . Alkaline Phosphatase 05/29/2015 56  38 - 126 U/L Final  . Total Bilirubin 05/29/2015 0.9  0.3 - 1.2 mg/dL Final  . GFR calc non Af Amer 05/29/2015 41* >60 mL/min  Final  . GFR calc Af Amer 05/29/2015 47* >60 mL/min Final   Comment: (NOTE) The eGFR has been calculated using the CKD EPI equation. This calculation has not been validated in all clinical situations. eGFR's persistently <60  mL/min signify possible Chronic Kidney Disease.   . Anion gap 05/29/2015 6  5 - 15 Final  . Blood Bank Specimen 05/29/2015 SAMPLE AVAILABLE FOR TESTING   Final  . Sample Expiration 05/29/2015 06/01/2015   Final    STUDIES: No results found.  ASSESSMENT:  Squamous cell carcinoma of the lung, stage IV disease. Thrombocytopenia.  PLAN:   1. Lung CA. Patient had restaging PET scan in early January which shows significant partial interval response to treatment thus far. 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 He is currently on chemotherapy with carboplatin and dose reduced gemcitabine, secondary to thrombocytopenia. Encouraged patient to use oxygen all the time and increase to 3 L if needed. 2. Anemia. Secondary to chemotherapy. Hemoglobin today is 7.6 and patient is having increasing shortness of breath as well as fatigue. We will schedule for 1 unit of packed red blood cells for tomorrow, Wednesday, 05/30/2015. 3. Thrombocytopenia. Platelets today were 11,000, also secondary to chemotherapy. We'll proceed with obtaining 1 unit of platelets today for transfusion. Patient will return tomorrow for recheck of CBC and for possible second unit of platelets if necessary.  Patient expressed understanding and was in agreement with this plan. He also understands that He can call clinic at any time with any questions, concerns, or complaints.   Dr. Oliva Bustard was available for consultation and review of plan of care for this patient.  Lung cancer ([place])   Staging form: Lung, AJCC 7th Edition     Clinical: Stage IV (T3, N2, M1b) - Signed by Curt Bears, MD on 05/25/2013     Pathologic: No stage assigned - Unsigned   Evlyn Kanner, NP   05/29/2015 2:04 PM     Attending's note Was seen by me.  All the findings are confirmed I agreed with the plan of transfusing platelets today and  Packed cells tomorrow  Recheck platelet counts tomorrow morning if needed 1 more unit can be transfused  Thrombocytopenia secondary to gemcitabine chemotherapy

## 2015-05-30 ENCOUNTER — Ambulatory Visit: Payer: Medicare Other

## 2015-05-30 ENCOUNTER — Inpatient Hospital Stay: Payer: Medicare Other | Attending: Oncology

## 2015-05-30 VITALS — BP 144/78 | HR 79 | Temp 97.9°F | Resp 18

## 2015-05-30 VITALS — BP 105/64 | HR 84 | Temp 97.2°F

## 2015-05-30 DIAGNOSIS — C3431 Malignant neoplasm of lower lobe, right bronchus or lung: Secondary | ICD-10-CM | POA: Diagnosis not present

## 2015-05-30 DIAGNOSIS — R5383 Other fatigue: Secondary | ICD-10-CM | POA: Insufficient documentation

## 2015-05-30 DIAGNOSIS — E1165 Type 2 diabetes mellitus with hyperglycemia: Secondary | ICD-10-CM | POA: Diagnosis not present

## 2015-05-30 DIAGNOSIS — R58 Hemorrhage, not elsewhere classified: Secondary | ICD-10-CM | POA: Diagnosis not present

## 2015-05-30 DIAGNOSIS — N189 Chronic kidney disease, unspecified: Secondary | ICD-10-CM | POA: Diagnosis not present

## 2015-05-30 DIAGNOSIS — C787 Secondary malignant neoplasm of liver and intrahepatic bile duct: Secondary | ICD-10-CM | POA: Insufficient documentation

## 2015-05-30 DIAGNOSIS — Z8701 Personal history of pneumonia (recurrent): Secondary | ICD-10-CM | POA: Insufficient documentation

## 2015-05-30 DIAGNOSIS — I2510 Atherosclerotic heart disease of native coronary artery without angina pectoris: Secondary | ICD-10-CM | POA: Insufficient documentation

## 2015-05-30 DIAGNOSIS — I714 Abdominal aortic aneurysm, without rupture: Secondary | ICD-10-CM | POA: Diagnosis not present

## 2015-05-30 DIAGNOSIS — Z79899 Other long term (current) drug therapy: Secondary | ICD-10-CM | POA: Insufficient documentation

## 2015-05-30 DIAGNOSIS — E119 Type 2 diabetes mellitus without complications: Secondary | ICD-10-CM | POA: Diagnosis not present

## 2015-05-30 DIAGNOSIS — K219 Gastro-esophageal reflux disease without esophagitis: Secondary | ICD-10-CM | POA: Diagnosis not present

## 2015-05-30 DIAGNOSIS — E7800 Pure hypercholesterolemia, unspecified: Secondary | ICD-10-CM | POA: Insufficient documentation

## 2015-05-30 DIAGNOSIS — I129 Hypertensive chronic kidney disease with stage 1 through stage 4 chronic kidney disease, or unspecified chronic kidney disease: Secondary | ICD-10-CM | POA: Diagnosis not present

## 2015-05-30 DIAGNOSIS — C7951 Secondary malignant neoplasm of bone: Secondary | ICD-10-CM | POA: Insufficient documentation

## 2015-05-30 DIAGNOSIS — D509 Iron deficiency anemia, unspecified: Secondary | ICD-10-CM | POA: Diagnosis not present

## 2015-05-30 DIAGNOSIS — Z794 Long term (current) use of insulin: Secondary | ICD-10-CM | POA: Diagnosis not present

## 2015-05-30 DIAGNOSIS — Z809 Family history of malignant neoplasm, unspecified: Secondary | ICD-10-CM | POA: Insufficient documentation

## 2015-05-30 DIAGNOSIS — Z85828 Personal history of other malignant neoplasm of skin: Secondary | ICD-10-CM | POA: Insufficient documentation

## 2015-05-30 DIAGNOSIS — D508 Other iron deficiency anemias: Secondary | ICD-10-CM

## 2015-05-30 DIAGNOSIS — Z87891 Personal history of nicotine dependence: Secondary | ICD-10-CM | POA: Insufficient documentation

## 2015-05-30 DIAGNOSIS — R531 Weakness: Secondary | ICD-10-CM | POA: Insufficient documentation

## 2015-05-30 DIAGNOSIS — D696 Thrombocytopenia, unspecified: Secondary | ICD-10-CM | POA: Diagnosis not present

## 2015-05-30 DIAGNOSIS — J449 Chronic obstructive pulmonary disease, unspecified: Secondary | ICD-10-CM | POA: Insufficient documentation

## 2015-05-30 DIAGNOSIS — Z923 Personal history of irradiation: Secondary | ICD-10-CM | POA: Diagnosis not present

## 2015-05-30 DIAGNOSIS — Z7951 Long term (current) use of inhaled steroids: Secondary | ICD-10-CM | POA: Insufficient documentation

## 2015-05-30 DIAGNOSIS — Z9221 Personal history of antineoplastic chemotherapy: Secondary | ICD-10-CM | POA: Diagnosis not present

## 2015-05-30 DIAGNOSIS — R0602 Shortness of breath: Secondary | ICD-10-CM | POA: Diagnosis not present

## 2015-05-30 LAB — CBC WITH DIFFERENTIAL/PLATELET
Basophils Absolute: 0 10*3/uL (ref 0–0.1)
Basophils Relative: 0 %
EOS PCT: 1 %
Eosinophils Absolute: 0 10*3/uL (ref 0–0.7)
HCT: 21 % — ABNORMAL LOW (ref 40.0–52.0)
Hemoglobin: 6.9 g/dL — ABNORMAL LOW (ref 13.0–18.0)
LYMPHS ABS: 0.2 10*3/uL — AB (ref 1.0–3.6)
LYMPHS PCT: 12 %
MCH: 30.5 pg (ref 26.0–34.0)
MCHC: 32.7 g/dL (ref 32.0–36.0)
MCV: 93.1 fL (ref 80.0–100.0)
MONO ABS: 0.2 10*3/uL (ref 0.2–1.0)
MONOS PCT: 12 %
Neutro Abs: 1.6 10*3/uL (ref 1.4–6.5)
Neutrophils Relative %: 75 %
PLATELETS: 40 10*3/uL — AB (ref 150–440)
RBC: 2.26 MIL/uL — ABNORMAL LOW (ref 4.40–5.90)
RDW: 18.7 % — AB (ref 11.5–14.5)
WBC: 2.1 10*3/uL — ABNORMAL LOW (ref 3.8–10.6)

## 2015-05-30 LAB — PREPARE RBC (CROSSMATCH)

## 2015-05-30 MED ORDER — ACETAMINOPHEN 325 MG PO TABS
650.0000 mg | ORAL_TABLET | Freq: Once | ORAL | Status: AC
  Administered 2015-05-30: 650 mg via ORAL

## 2015-05-30 MED ORDER — DIPHENHYDRAMINE HCL 25 MG PO CAPS
25.0000 mg | ORAL_CAPSULE | Freq: Once | ORAL | Status: AC
  Administered 2015-05-30: 25 mg via ORAL

## 2015-05-30 MED ORDER — FUROSEMIDE 10 MG/ML IJ SOLN: 20.0000 mg | Freq: Once | INTRAMUSCULAR | Status: DC

## 2015-05-30 MED ORDER — SODIUM CHLORIDE 0.9 % IV SOLN
250.0000 mL | Freq: Once | INTRAVENOUS | Status: AC
  Administered 2015-05-30: 250 mL via INTRAVENOUS
  Filled 2015-05-30: qty 250

## 2015-05-30 MED ORDER — SODIUM CHLORIDE 0.9% FLUSH
10.0000 mL | INTRAVENOUS | Status: DC | PRN
  Filled 2015-05-30: qty 10

## 2015-05-30 MED ORDER — SODIUM CHLORIDE 0.9 % IV SOLN
INTRAVENOUS | Status: AC
  Filled 2015-05-30: qty 1000

## 2015-05-30 MED ORDER — SODIUM CHLORIDE 0.9 % IV SOLN
250.0000 mL | Freq: Once | INTRAVENOUS | Status: AC
Start: 2015-05-30 — End: 2015-05-30
  Administered 2015-05-30: 250 mL via INTRAVENOUS
  Filled 2015-05-30: qty 250

## 2015-05-30 MED ORDER — FUROSEMIDE 10 MG/ML IJ SOLN
20.0000 mg | Freq: Once | INTRAMUSCULAR | Status: AC
  Administered 2015-05-30: 20 mg via INTRAMUSCULAR

## 2015-05-30 MED ORDER — HEPARIN SOD (PORK) LOCK FLUSH 100 UNIT/ML IV SOLN
500.0000 [IU] | Freq: Once | INTRAVENOUS | Status: AC
  Administered 2015-05-30: 500 [IU] via INTRAVENOUS
  Filled 2015-05-30: qty 5

## 2015-05-30 MED ORDER — DIPHENHYDRAMINE HCL 25 MG PO CAPS
25.0000 mg | ORAL_CAPSULE | Freq: Once | ORAL | Status: DC
  Filled 2015-05-30: qty 1

## 2015-05-30 MED ORDER — ACETAMINOPHEN 325 MG PO TABS
650.0000 mg | ORAL_TABLET | Freq: Once | ORAL | Status: AC
  Administered 2015-05-30: 650 mg via ORAL
  Filled 2015-05-30: qty 2

## 2015-05-30 NOTE — Progress Notes (Signed)
Unable to document on transfusion flow sheet.  Transfusion ended on 12:01, 350 ml of blood infused.  Vital signs documented on flow sheet.  Unable to complete transfusion on flow sheet.

## 2015-05-31 LAB — TYPE AND SCREEN
ABO/RH(D): A POS
Antibody Screen: NEGATIVE
Unit division: 0
Unit division: 0

## 2015-05-31 LAB — PREPARE PLATELET PHERESIS: Unit division: 0

## 2015-06-05 ENCOUNTER — Inpatient Hospital Stay: Payer: Medicare Other

## 2015-06-05 ENCOUNTER — Other Ambulatory Visit: Payer: Self-pay | Admitting: *Deleted

## 2015-06-05 ENCOUNTER — Inpatient Hospital Stay (HOSPITAL_BASED_OUTPATIENT_CLINIC_OR_DEPARTMENT_OTHER): Payer: Medicare Other | Admitting: Oncology

## 2015-06-05 ENCOUNTER — Encounter: Payer: Self-pay | Admitting: Oncology

## 2015-06-05 VITALS — BP 0/0 | HR 92 | Temp 98.3°F | Resp 18 | Wt 192.3 lb

## 2015-06-05 DIAGNOSIS — D696 Thrombocytopenia, unspecified: Secondary | ICD-10-CM | POA: Diagnosis not present

## 2015-06-05 DIAGNOSIS — R531 Weakness: Secondary | ICD-10-CM

## 2015-06-05 DIAGNOSIS — Z7951 Long term (current) use of inhaled steroids: Secondary | ICD-10-CM

## 2015-06-05 DIAGNOSIS — I129 Hypertensive chronic kidney disease with stage 1 through stage 4 chronic kidney disease, or unspecified chronic kidney disease: Secondary | ICD-10-CM | POA: Diagnosis not present

## 2015-06-05 DIAGNOSIS — R58 Hemorrhage, not elsewhere classified: Secondary | ICD-10-CM

## 2015-06-05 DIAGNOSIS — E119 Type 2 diabetes mellitus without complications: Secondary | ICD-10-CM

## 2015-06-05 DIAGNOSIS — I2510 Atherosclerotic heart disease of native coronary artery without angina pectoris: Secondary | ICD-10-CM

## 2015-06-05 DIAGNOSIS — Z794 Long term (current) use of insulin: Secondary | ICD-10-CM

## 2015-06-05 DIAGNOSIS — C3490 Malignant neoplasm of unspecified part of unspecified bronchus or lung: Secondary | ICD-10-CM

## 2015-06-05 DIAGNOSIS — N189 Chronic kidney disease, unspecified: Secondary | ICD-10-CM

## 2015-06-05 DIAGNOSIS — Z923 Personal history of irradiation: Secondary | ICD-10-CM

## 2015-06-05 DIAGNOSIS — J449 Chronic obstructive pulmonary disease, unspecified: Secondary | ICD-10-CM

## 2015-06-05 DIAGNOSIS — C801 Malignant (primary) neoplasm, unspecified: Secondary | ICD-10-CM

## 2015-06-05 DIAGNOSIS — R5383 Other fatigue: Secondary | ICD-10-CM

## 2015-06-05 DIAGNOSIS — Z809 Family history of malignant neoplasm, unspecified: Secondary | ICD-10-CM

## 2015-06-05 DIAGNOSIS — Z79899 Other long term (current) drug therapy: Secondary | ICD-10-CM

## 2015-06-05 DIAGNOSIS — R0602 Shortness of breath: Secondary | ICD-10-CM

## 2015-06-05 DIAGNOSIS — C3431 Malignant neoplasm of lower lobe, right bronchus or lung: Secondary | ICD-10-CM

## 2015-06-05 DIAGNOSIS — Z87891 Personal history of nicotine dependence: Secondary | ICD-10-CM

## 2015-06-05 DIAGNOSIS — C787 Secondary malignant neoplasm of liver and intrahepatic bile duct: Secondary | ICD-10-CM

## 2015-06-05 DIAGNOSIS — Z8701 Personal history of pneumonia (recurrent): Secondary | ICD-10-CM

## 2015-06-05 DIAGNOSIS — E7800 Pure hypercholesterolemia, unspecified: Secondary | ICD-10-CM

## 2015-06-05 DIAGNOSIS — C7951 Secondary malignant neoplasm of bone: Secondary | ICD-10-CM

## 2015-06-05 DIAGNOSIS — K219 Gastro-esophageal reflux disease without esophagitis: Secondary | ICD-10-CM

## 2015-06-05 DIAGNOSIS — Z9221 Personal history of antineoplastic chemotherapy: Secondary | ICD-10-CM

## 2015-06-05 DIAGNOSIS — I714 Abdominal aortic aneurysm, without rupture: Secondary | ICD-10-CM

## 2015-06-05 DIAGNOSIS — Z85828 Personal history of other malignant neoplasm of skin: Secondary | ICD-10-CM

## 2015-06-05 LAB — CBC WITH DIFFERENTIAL/PLATELET
Basophils Absolute: 0 10*3/uL (ref 0–0.1)
Basophils Relative: 1 %
Eosinophils Absolute: 0.1 10*3/uL (ref 0–0.7)
Eosinophils Relative: 1 %
HEMATOCRIT: 28.5 % — AB (ref 40.0–52.0)
Hemoglobin: 9.5 g/dL — ABNORMAL LOW (ref 13.0–18.0)
LYMPHS PCT: 3 %
Lymphs Abs: 0.2 10*3/uL — ABNORMAL LOW (ref 1.0–3.6)
MCH: 31.5 pg (ref 26.0–34.0)
MCHC: 33.5 g/dL (ref 32.0–36.0)
MCV: 94.2 fL (ref 80.0–100.0)
MONO ABS: 0.8 10*3/uL (ref 0.2–1.0)
MONOS PCT: 14 %
NEUTROS ABS: 4.3 10*3/uL (ref 1.4–6.5)
Neutrophils Relative %: 81 %
Platelets: 51 10*3/uL — ABNORMAL LOW (ref 150–440)
RBC: 3.03 MIL/uL — ABNORMAL LOW (ref 4.40–5.90)
RDW: 18 % — AB (ref 11.5–14.5)
WBC: 5.3 10*3/uL (ref 3.8–10.6)

## 2015-06-05 LAB — COMPREHENSIVE METABOLIC PANEL
ALT: 35 U/L (ref 17–63)
AST: 16 U/L (ref 15–41)
Albumin: 3.7 g/dL (ref 3.5–5.0)
Alkaline Phosphatase: 56 U/L (ref 38–126)
Anion gap: 7 (ref 5–15)
BUN: 20 mg/dL (ref 6–20)
CALCIUM: 8.7 mg/dL — AB (ref 8.9–10.3)
CHLORIDE: 100 mmol/L — AB (ref 101–111)
CO2: 33 mmol/L — ABNORMAL HIGH (ref 22–32)
CREATININE: 1.41 mg/dL — AB (ref 0.61–1.24)
GFR, EST AFRICAN AMERICAN: 52 mL/min — AB (ref >60)
GFR, EST NON AFRICAN AMERICAN: 45 mL/min — AB (ref >60)
Glucose, Bld: 152 mg/dL — ABNORMAL HIGH (ref 65–99)
Potassium: 3.2 mmol/L — ABNORMAL LOW (ref 3.5–5.1)
Sodium: 140 mmol/L (ref 135–145)
TOTAL PROTEIN: 5.8 g/dL — AB (ref 6.5–8.1)
Total Bilirubin: 1 mg/dL (ref 0.3–1.2)

## 2015-06-05 MED ORDER — HEPARIN SOD (PORK) LOCK FLUSH 100 UNIT/ML IV SOLN
INTRAVENOUS | Status: AC
  Filled 2015-06-05: qty 5

## 2015-06-05 MED ORDER — HEPARIN SOD (PORK) LOCK FLUSH 100 UNIT/ML IV SOLN: 500.0000 [IU] | Freq: Once | INTRAVENOUS | Status: DC

## 2015-06-05 MED ORDER — FUROSEMIDE 20 MG PO TABS: ORAL_TABLET | ORAL | Status: DC

## 2015-06-05 MED ORDER — SODIUM CHLORIDE 0.9% FLUSH
10.0000 mL | INTRAVENOUS | Status: DC | PRN
  Filled 2015-06-05: qty 10

## 2015-06-05 NOTE — Progress Notes (Signed)
Patient states he continues to be SOB.  O2 92%.  States he is on 3L of O2 @ home.  Also c/o bilateral lower extremity edema.  Also has a sore on right shin and chest area near port.

## 2015-06-05 NOTE — Progress Notes (Signed)
Progress Notes   ABDINASIR SPADAFORE (MR# [id number])      Progress Notes Info    Author Note Status Last Update User Last Update Date/Time   Evlyn Kanner, NP Incomplete Evlyn Kanner, NP 05/29/2015 2:49 PM    Progress Notes    Expand All Collapse All    [place]  [phone] [phone]     [name] [name] DOB: [birth year] MR#: [id number] PJA#:[id number]  Patient Care Team: Derinda Late, MD as PCP - General (Family Medicine)  CHIEF COMPLAINT:  Chief Complaint  Patient presents with  . Lung Cancer    INTERVAL HISTORY:  Patient is being seen as an acute add-on today regarding extremely easy bruising in the upper extremities. Patient was here for a lab check only following chemotherapy with [place] and gemcitabine for treatment of non-small cell carcinoma of the lung. He also reports having some mild increase in shortness of breath. He has oxygen at home but does not use outside of the home. He denies any fever, chills, rectal bleeding, hemoptysis, or other acute issues. Patient is here for ongoing evaluation and treatment consideration.  Shortness of breath persists.  Ecchymosis is improving. REVIEW OF SYSTEMS:  Review of Systems  Constitutional: Positive for malaise/fatigue. Negative for fever, chills, weight loss and diaphoresis.  HENT: Negative.  Eyes: Negative.  Respiratory:. Negative for cough, hemoptysis, sputum production and wheezing.  Cardiovascular: Negative for chest pain, palpitations, orthopnea, claudication, leg swelling and PND.  Gastrointestinal: Negative for heartburn, nausea, vomiting, abdominal pain, diarrhea, constipation, blood in stool and melena.  Genitourinary: Negative.  Musculoskeletal: Negative.  Skin: Negative.  Neurological: Negative for dizziness, tingling, focal weakness, seizures and weakness.  Endo/Heme/Allergies: Ecchymosis and bruises are getting better.Marland Kitchen  Psychiatric/Behavioral:  Negative for depression. The patient is not nervous/anxious and does not have insomnia.  Swelling in lower extremity  As per HPI. Otherwise, a complete review of systems is negatve.  ONCOLOGY HISTORY: Oncology History   The patient is an 82 year old gentleman with stage IV squamous cell carcinoma of the lung who is seen for assessment prior to Nivolumab.  1. Squamous cell carcinoma of lung. Bronchoscopies positive for right lower lobe mass. Started on radiation therapy and chemotherapy. August, 2012 Carboplatinum (AUC of 2) and Taxol with concurrent radiation therapy 2. Finished chemo / radiation in sept 2012. 3. Bronchoscopy was negative for any malignant cells (August, 2013) 4. Continuing hemoptysis. Repeat bronchoscopy is positive for malignant cells consistent with squamous cell carcinoma. 5. Palliative radiation and chemotherapy with carboplatinum and Taxol.(October, 2013) has finished radiation therapy and Taxol, carboplatinum in November of 2013 6.veristrat test is good 7.on Tarceva 8.progressing disease on Tarceva. (January, 2015) discontinue Tarceva. 9.started on the investigational protocol with [facility] February, 2015 10Patient is off protocol and continuing NIVO as. commercial product 11. Complete evaluation during July 2 016 including thoracentesis cytology was negative for malignant cells 12. Progressive disease by PET scan criteria. Suggested chemotherapy with carboplatinum and gemcitabine starting from November of 2016.     Lung cancer ([place])   12/24/2010 Initial Diagnosis Lung cancer    PAST MEDICAL HISTORY: Past Medical History  Diagnosis Date  . Hypercholesteremia   . Hypertension   . Diabetes mellitus without complication ([place])   . AAA (abdominal aortic aneurysm) ([place])   . Lung cancer ([place])     09/2010  . Skin cancer   . CAD (coronary artery disease) stent placed  . COPD (chronic obstructive pulmonary  disease) ([place])   .  Shortness of breath dyspnea   . Pneumonia   . Chronic kidney disease   . GERD (gastroesophageal reflux disease)   . Headache     PAST SURGICAL HISTORY: Past Surgical History  Procedure Laterality Date  . Abdominal aortic aneurysm repair  2000  . Coronary stent placement  2000  . Tonsillectomy      when he was a child    FAMILY HISTORY Family History  Problem Relation Age of Onset  . Cancer Mother   . Cancer Sister   . Cancer Brother   . Cancer Maternal Aunt     GYNECOLOGIC HISTORY: No LMP for male patient.    ADVANCED DIRECTIVES:    HEALTH MAINTENANCE: Social History  Substance Use Topics  . Smoking status: Former Smoker    Types: Cigarettes, Cigars    Quit date: 04/29/2003  . Smokeless tobacco: None  . Alcohol Use: No    Colonoscopy: PAP: Bone density: Lipid panel:  Allergies  Allergen Reactions  . Penicillin G Rash  . Sulfa Antibiotics Rash    Current Outpatient Prescriptions  Medication Sig Dispense Refill  . ADVAIR DISKUS 250-50 MCG/DOSE AEPB INHALE 1 PUFF TWICE A DAY 60 each 0  . benzonatate (TESSALON) 200 MG capsule Take 200 mg by mouth 3 (three) times daily as needed for cough.    . chlorpheniramine-HYDROcodone (TUSSIONEX PENNKINETIC ER) 10-8 MG/5ML SUER Take 5 mLs by mouth every 12 (twelve) hours as needed for cough. 240 mL 0  . citalopram (CELEXA) 20 MG tablet TAKE 1 TABLET (20 MG TOTAL) BY MOUTH DAILY. 30 tablet 3  . fluticasone (FLONASE) 50 MCG/ACT nasal spray 1 spray. 1 spray in each nostril BID    . furosemide (LASIX) 20 MG tablet Take 1 tablet (20 mg total) by mouth every other day. 30 tablet   . HUMALOG KWIKPEN 100 UNIT/ML KiwkPen CHECK BLOOD SUGAR BEFORE EACH MEAL. INJECT UNDER THE SKIN PER SLIDING SCALE. MAX 30 U PER DAY  2  .  ipratropium-albuterol (DUONEB) 0.5-2.5 (3) MG/3ML SOLN Take 3 mLs by nebulization every 8 (eight) hours as needed.    Marland Kitchen LANTUS SOLOSTAR 100 UNIT/ML Solostar Pen 22 Units. 22 units at bedtime    . levofloxacin (LEVAQUIN) 500 MG tablet Take 1 tablet (500 mg total) by mouth daily. 5 tablet 0  . metoprolol succinate (TOPROL-XL) 100 MG 24 hr tablet Take 100 mg by mouth daily.    Marland Kitchen omeprazole (PRILOSEC) 40 MG capsule TAKE 1 CAPSULE (40 MG TOTAL) BY MOUTH DAILY. 30 capsule 0  . ondansetron (ZOFRAN) 4 MG tablet Take 1 tablet (4 mg total) by mouth every 6 (six) hours as needed for nausea or vomiting. 30 tablet 3  . predniSONE (DELTASONE) 10 MG tablet Start 69m tapers by 122mdaily to 0. 15 tablet 0  . predniSONE (DELTASONE) 10 MG tablet 1 TAB(S) ORALLY ONCE A DAY 30 tablet 3  . PROAIR HFA 108 (90 BASE) MCG/ACT inhaler INHALE 2 PUFFS 4 TIMES A DAY AS NEEDED 8.5 Inhaler 0  . SPIRIVA HANDIHALER 18 MCG inhalation capsule INHALE 1 CAPSULE WITH HANDIHALER ONCE A DAY 30 capsule 5  . tamsulosin (FLOMAX) 0.4 MG CAPS capsule TAKE ONE CAPSULE BY MOUTH EVERY DAY 30 capsule 7  . tobramycin-dexamethasone (TOBRADEX) ophthalmic ointment Place 1 application into both eyes 2 (two) times daily. 3.5 g 0  . Triamcinolone Acetonide (TRIAMCINOLONE 0.1 % CREAM : EUCERIN) CREA Apply 1 application topically daily. 1 each 0   No current facility-administered medications for this visit.  Facility-Administered Medications Ordered in Other Visits  Medication Dose Route Frequency Provider Last Rate Last Dose  . 0.9 % sodium chloride infusion 250 mL Intravenous Once Evlyn Kanner, NP    . sodium chloride 0.9 % injection 10 mL 10 mL Intracatheter PRN Forest Gleason, MD  10 mL at 09/26/14 1405  . sodium chloride 0.9 % injection 10 mL 10 mL Intracatheter PRN Forest Gleason, MD  10 mL at 02/08/15 1419    OBJECTIVE: BP 94/56 mmHg   Pulse 84  Temp(Src) 96.9 F (36.1 C) (Tympanic)  Resp 18  Wt 192 lb 4 oz (87.204 kg) Body mass index is 27.58 kg/(m^2). ECOG FS:2 - Symptomatic, <50% confined to bed  General: Well-developed, well-nourished, no acute distress. Eyes: Pink conjunctiva, anicteric sclera. HEENT: Normocephalic, moist mucous membranes, clear oropharnyx. Lungs: Clear to auscultation bilaterally. Heart: Regular rate and rhythm. No rubs, murmurs, or gallops. Musculoskeletal: No edema, cyanosis, or clubbing. Neuro: Alert, answering all questions appropriately. Cranial nerves grossly intact. Skin: Scattered ecchymosis. Left hand and right upper arm with severe discoloration due to bruising. Psych: Normal affect.    LAB RESULTS:  Clinical Support on 05/29/2015  Component Date Value Ref Range Status  . WBC 05/29/2015 2.1* 3.8 - 10.6 K/uL Final  . RBC 05/29/2015 2.50* 4.40 - 5.90 MIL/uL Final  . Hemoglobin 05/29/2015 7.6* 13.0 - 18.0 g/dL Final  . HCT 05/29/2015 23.7* 40.0 - 52.0 % Final  . MCV 05/29/2015 94.5  80.0 - 100.0 fL Final  . MCH 05/29/2015 30.3  26.0 - 34.0 pg Final  . MCHC 05/29/2015 32.1  32.0 - 36.0 g/dL Final  . RDW 05/29/2015 18.8* 11.5 - 14.5 % Final  . Platelets 05/29/2015 11* 150 - 440 K/uL Final   Comment: PLATELET COUNT CONFIRMED BY SMEAR PLATELETS APPEAR DECREASED CRITICAL RESULT CALLED TO, READ BACK BY AND VERIFIED WITH: NYO TO LESLIE HERRING,NP 05/29/15 1143   . Neutrophils Relative % 05/29/2015 81%   Final  . Neutro Abs 05/29/2015 1.7  1.4 - 6.5 K/uL Final  . Lymphocytes Relative 05/29/2015 13%   Final  . Lymphs Abs 05/29/2015 0.3* 1.0 - 3.6 K/uL Final  . Monocytes Relative 05/29/2015 6%   Final  . Monocytes Absolute 05/29/2015 0.1* 0.2 - 1.0 K/uL Final  . Eosinophils Relative 05/29/2015 0%   Final  . Eosinophils Absolute 05/29/2015 0.0  0 - 0.7 K/uL Final  .  Basophils Relative 05/29/2015 0%   Final  . Basophils Absolute 05/29/2015 0.0  0 - 0.1 K/uL Final  . Sodium 05/29/2015 140  135 - 145 mmol/L Final  . Potassium 05/29/2015 3.6  3.5 - 5.1 mmol/L Final  . Chloride 05/29/2015 103  101 - 111 mmol/L Final  . CO2 05/29/2015 31  22 - 32 mmol/L Final  . Glucose, Bld 05/29/2015 110* 65 - 99 mg/dL Final  . BUN 05/29/2015 22* 6 - 20 mg/dL Final  . Creatinine, Ser 05/29/2015 1.53* 0.61 - 1.24 mg/dL Final  . Calcium 05/29/2015 8.8* 8.9 - 10.3 mg/dL Final  . Total Protein 05/29/2015 6.2* 6.5 - 8.1 g/dL Final  . Albumin 05/29/2015 3.6  3.5 - 5.0 g/dL Final  . AST 05/29/2015 29  15 - 41 U/L Final  . ALT 05/29/2015 48  17 - 63 U/L Final  . Alkaline Phosphatase 05/29/2015 56  38 - 126 U/L Final  . Total Bilirubin 05/29/2015 0.9  0.3 - 1.2 mg/dL Final  . GFR calc non Af Amer 05/29/2015 41* >60 mL/min Final  . GFR  calc Af Amer 05/29/2015 47* >60 mL/min Final   Comment: (NOTE) The eGFR has been calculated using the CKD EPI equation. This calculation has not been validated in all clinical situations. eGFR's persistently <60 mL/min signify possible Chronic Kidney Disease.   . Anion gap 05/29/2015 6  5 - 15 Final  . Blood Bank Specimen 05/29/2015 SAMPLE AVAILABLE FOR TESTING   Final  . Sample Expiration 05/29/2015 06/01/2015   Final    STUDIES: No results found.  ASSESSMENT:  Squamous cell carcinoma of the lung, stage IV disease. Thrombocytopenia.  PLAN:  1. Lung CA. Patient had restaging PET scan in early January which shows significant partial interval response to treatment thus far. 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 He is currently on chemotherapy with carboplatin and dose reduced gemcitabine, secondary to thrombocytopenia. Encouraged patient to use oxygen all the time and  increase to 3 L if needed.  Thrombocytopenia is improved but platelet count is 51,000 We will hold off any further chemotherapy because of progressive side effect of chemotherapy. Swelling of lower extremity will increase Lasix to daily for 7 days and then will go back on every other day. Patient expressed understanding and was in agreement with this plan. He also understands that He can call clinic at any time with any questions, concerns, or complaints.   3.  Renal insufficiency we will monitor rate I had prolonged discussion with patient's family regarding stopping treatment to improve her quality of life and reevaluation after 2 weeks or before the patient develops any fever  Total duration of visit was 30 minutes.  50% or more time was spent in counseling patient and family regarding prognosis and options of treatment and available resources Lung cancer [facility])  Staging form: Lung, AJCC 7th Edition  Clinical: Stage IV (T3, N2, M1b) - Signed by Curt Bears, MD on 05/25/2013  Pathologic: No stage assigned - Unsigned   Hemoglobin is low but transfuse 1 unit of packed cells A platelet count is low tomorrow less than 20 then another unit of platelet could be transfused

## 2015-06-05 NOTE — Addendum Note (Signed)
Addended by: Telford Nab on: 06/05/2015 10:22 AM   Modules accepted: Orders, Medications

## 2015-06-14 ENCOUNTER — Telehealth: Payer: Self-pay | Admitting: *Deleted

## 2015-06-14 MED ORDER — TORSEMIDE 20 MG PO TABS: 40.0000 mg | ORAL_TABLET | Freq: Every day | ORAL | Status: DC

## 2015-06-14 NOTE — Telephone Encounter (Signed)
Reports that his lasix has been doubled for the past week and he still has a considerable amount of swelling Asking if heshould continue with double dose of lasix until he sees you again On Tuesday 2/21

## 2015-06-14 NOTE — Telephone Encounter (Signed)
D/C lasix, start Demadex 40 mg daily # 30 tabs VO Dr Oliva Bustard / bce I returned call to [facility] and informed her of above, she repeated back to me and said she will start [facility] tomorrow since she has already given lasix this morning

## 2015-06-19 ENCOUNTER — Encounter: Payer: Self-pay | Admitting: Oncology

## 2015-06-19 ENCOUNTER — Inpatient Hospital Stay: Payer: Medicare Other | Admitting: *Deleted

## 2015-06-19 ENCOUNTER — Inpatient Hospital Stay (HOSPITAL_BASED_OUTPATIENT_CLINIC_OR_DEPARTMENT_OTHER): Payer: Medicare Other | Admitting: Oncology

## 2015-06-19 VITALS — BP 107/65 | HR 88 | Temp 97.1°F | Resp 18 | Wt 183.6 lb

## 2015-06-19 DIAGNOSIS — Z7951 Long term (current) use of inhaled steroids: Secondary | ICD-10-CM

## 2015-06-19 DIAGNOSIS — C7951 Secondary malignant neoplasm of bone: Secondary | ICD-10-CM

## 2015-06-19 DIAGNOSIS — E1165 Type 2 diabetes mellitus with hyperglycemia: Secondary | ICD-10-CM

## 2015-06-19 DIAGNOSIS — R58 Hemorrhage, not elsewhere classified: Secondary | ICD-10-CM | POA: Diagnosis not present

## 2015-06-19 DIAGNOSIS — R5383 Other fatigue: Secondary | ICD-10-CM

## 2015-06-19 DIAGNOSIS — N189 Chronic kidney disease, unspecified: Secondary | ICD-10-CM

## 2015-06-19 DIAGNOSIS — C787 Secondary malignant neoplasm of liver and intrahepatic bile duct: Secondary | ICD-10-CM

## 2015-06-19 DIAGNOSIS — Z9221 Personal history of antineoplastic chemotherapy: Secondary | ICD-10-CM

## 2015-06-19 DIAGNOSIS — C3431 Malignant neoplasm of lower lobe, right bronchus or lung: Secondary | ICD-10-CM

## 2015-06-19 DIAGNOSIS — Z923 Personal history of irradiation: Secondary | ICD-10-CM

## 2015-06-19 DIAGNOSIS — Z794 Long term (current) use of insulin: Secondary | ICD-10-CM

## 2015-06-19 DIAGNOSIS — Z79899 Other long term (current) drug therapy: Secondary | ICD-10-CM

## 2015-06-19 DIAGNOSIS — D509 Iron deficiency anemia, unspecified: Secondary | ICD-10-CM

## 2015-06-19 DIAGNOSIS — Z8701 Personal history of pneumonia (recurrent): Secondary | ICD-10-CM

## 2015-06-19 DIAGNOSIS — R531 Weakness: Secondary | ICD-10-CM

## 2015-06-19 DIAGNOSIS — Z809 Family history of malignant neoplasm, unspecified: Secondary | ICD-10-CM

## 2015-06-19 DIAGNOSIS — J449 Chronic obstructive pulmonary disease, unspecified: Secondary | ICD-10-CM

## 2015-06-19 DIAGNOSIS — I129 Hypertensive chronic kidney disease with stage 1 through stage 4 chronic kidney disease, or unspecified chronic kidney disease: Secondary | ICD-10-CM | POA: Diagnosis not present

## 2015-06-19 DIAGNOSIS — Z85828 Personal history of other malignant neoplasm of skin: Secondary | ICD-10-CM

## 2015-06-19 DIAGNOSIS — K219 Gastro-esophageal reflux disease without esophagitis: Secondary | ICD-10-CM

## 2015-06-19 DIAGNOSIS — I2510 Atherosclerotic heart disease of native coronary artery without angina pectoris: Secondary | ICD-10-CM

## 2015-06-19 DIAGNOSIS — E7800 Pure hypercholesterolemia, unspecified: Secondary | ICD-10-CM

## 2015-06-19 DIAGNOSIS — R0602 Shortness of breath: Secondary | ICD-10-CM

## 2015-06-19 DIAGNOSIS — Z87891 Personal history of nicotine dependence: Secondary | ICD-10-CM

## 2015-06-19 DIAGNOSIS — C3490 Malignant neoplasm of unspecified part of unspecified bronchus or lung: Secondary | ICD-10-CM

## 2015-06-19 DIAGNOSIS — I714 Abdominal aortic aneurysm, without rupture: Secondary | ICD-10-CM

## 2015-06-19 LAB — COMPREHENSIVE METABOLIC PANEL
ALBUMIN: 3.9 g/dL (ref 3.5–5.0)
ALK PHOS: 63 U/L (ref 38–126)
ALT: 19 U/L (ref 17–63)
AST: 17 U/L (ref 15–41)
Anion gap: 9 (ref 5–15)
BUN: 43 mg/dL — ABNORMAL HIGH (ref 6–20)
CO2: 36 mmol/L — AB (ref 22–32)
Calcium: 9.3 mg/dL (ref 8.9–10.3)
Chloride: 95 mmol/L — ABNORMAL LOW (ref 101–111)
Creatinine, Ser: 2.12 mg/dL — ABNORMAL HIGH (ref 0.61–1.24)
GFR calc Af Amer: 32 mL/min — ABNORMAL LOW (ref >60)
GFR calc non Af Amer: 27 mL/min — ABNORMAL LOW (ref >60)
GLUCOSE: 173 mg/dL — AB (ref 65–99)
Potassium: 3.4 mmol/L — ABNORMAL LOW (ref 3.5–5.1)
SODIUM: 140 mmol/L (ref 135–145)
TOTAL PROTEIN: 6.8 g/dL (ref 6.5–8.1)
Total Bilirubin: 0.8 mg/dL (ref 0.3–1.2)

## 2015-06-19 LAB — CBC WITH DIFFERENTIAL/PLATELET
BASOS ABS: 0.1 10*3/uL (ref 0–0.1)
BASOS PCT: 1 %
EOS ABS: 0 10*3/uL (ref 0–0.7)
Eosinophils Relative: 0 %
HCT: 34.8 % — ABNORMAL LOW (ref 40.0–52.0)
HEMOGLOBIN: 11.3 g/dL — AB (ref 13.0–18.0)
LYMPHS ABS: 0.6 10*3/uL — AB (ref 1.0–3.6)
Lymphocytes Relative: 4 %
MCH: 30.7 pg (ref 26.0–34.0)
MCHC: 32.3 g/dL (ref 32.0–36.0)
MCV: 94.9 fL (ref 80.0–100.0)
Monocytes Absolute: 1.6 10*3/uL — ABNORMAL HIGH (ref 0.2–1.0)
Monocytes Relative: 11 %
NEUTROS PCT: 84 %
Neutro Abs: 12 10*3/uL — ABNORMAL HIGH (ref 1.4–6.5)
Platelets: 136 10*3/uL — ABNORMAL LOW (ref 150–440)
RBC: 3.67 MIL/uL — AB (ref 4.40–5.90)
RDW: 17.5 % — ABNORMAL HIGH (ref 11.5–14.5)
WBC: 14.3 10*3/uL — AB (ref 3.8–10.6)

## 2015-06-19 LAB — SAMPLE TO BLOOD BANK

## 2015-06-19 LAB — MAGNESIUM: MAGNESIUM: 1.5 mg/dL — AB (ref 1.7–2.4)

## 2015-06-19 MED ORDER — ALPRAZOLAM 0.25 MG PO TABS: 0.2500 mg | ORAL_TABLET | Freq: Three times a day (TID) | ORAL | Status: AC | PRN

## 2015-06-19 NOTE — Progress Notes (Signed)
Patient fell yesterday in his BR.  Wife states his blood glucose shot up to over 400.   Patient states his chest is sore today from the fall.  Also has a small scrape on his forehead.

## 2015-06-19 NOTE — Progress Notes (Signed)
Progress Notes   [name] [name] (MR# [id number])      Progress Notes Info                 Progress Notes      [place]  [phone] [phone]     [name] [name] DOB: 02/20/1931 MR#: [id number] ENI#:[id number]  Patient Care Team: Derinda Late, MD as PCP - General (Family Medicine)  CHIEF COMPLAINT:  Chief Complaint  Patient presents with  . Lung Cancer    INTERVAL HISTORY:  82 year old gentleman with stage IV carcinoma of lung gradually patient's condition has been declining. Patient is spending more and more time in the bed.  Family reports patient had frequent fall and another fall last night. As swelling of lower extremity which has been treated with Demadex and patient responded very well. But now has developed mild dehydration. Family also reports that after we discontinued mealtime insulin blood sugar was quite high Has sleep disturbances.  Patient also get panic attack and tries to walk because he cannot breathe Patient is not using oxygen on a regular basis even those he is supposed to have oxygen all the time REVIEW OF SYSTEMS:  Review of Systems declining performance status Multiple  complains as mentioned about. Constitutional: Positive for malaise/fatigue. Negative for fever, chills, weight loss and diaphoresis.  HENT: Hard of hearing  Eyes: Negative.  Respiratory:. Negative for cough, hemoptysis, sputum production and wheezing.  Cardiovascular: Negative for chest pain, palpitations, orthopnea, claudication, leg swelling and PND.  Gastrointestinal: Negative for heartburn, nausea, vomiting, abdominal pain, diarrhea, constipation, blood in stool and melena.  Genitourinary: Negative.  Musculoskeletal: Negative.  Skin: Negative.  Neurological: Negative for dizziness, tingling, focal weakness, seizures and weakness.  Endo/Heme/Allergies: Ecchymosis and bruises are getting better.[name] [name]  Psychiatric/Behavioral:  Negative for depression. The patient is not nervous/anxious and does not have insomnia.  Swelling in lower extremity  As per HPI. Otherwise, a complete review of systems is negatve.  ONCOLOGY HISTORY: Oncology History   The patient is an 82 year old gentleman with stage IV squamous cell carcinoma of the lung who is seen for assessment prior to Nivolumab.  1. Squamous cell carcinoma of lung. Bronchoscopies positive for right lower lobe mass. Started on radiation therapy and chemotherapy. August, 2012 Carboplatinum (AUC of 2) and Taxol with concurrent radiation therapy 2. Finished chemo / radiation in sept 2012. 3. Bronchoscopy was negative for any malignant cells (August, 2013) 4. Continuing hemoptysis. Repeat bronchoscopy is positive for malignant cells consistent with squamous cell carcinoma. 5. Palliative radiation and chemotherapy with carboplatinum and Taxol.(October, 2013) has finished radiation therapy and Taxol, carboplatinum in November of 2013 6.veristrat test is good 7.on Tarceva 8.progressing disease on Tarceva. (January, 2015) discontinue Tarceva. 9.started on the investigational protocol with [facility] February, 2015 10Patient is off protocol and continuing NIVO as. commercial product 11. Complete evaluation during July 2 016 including thoracentesis cytology was negative for malignant cells 12. Progressive disease by PET scan criteria. Suggested chemotherapy with carboplatinum and gemcitabine starting from November of 2016.     Lung cancer ([place])   12/24/2010 Initial Diagnosis Lung cancer    PAST MEDICAL HISTORY: Past Medical History  Diagnosis Date  . Hypercholesteremia   . Hypertension   . Diabetes mellitus without complication ([place])   . AAA (abdominal aortic aneurysm) ([place])   . Lung cancer ([place])     09/2010  . Skin cancer   . CAD (coronary artery disease) stent placed  . COPD (chronic  obstructive pulmonary  disease) ([place])   . Shortness of breath dyspnea   . Pneumonia   . Chronic kidney disease   . GERD (gastroesophageal reflux disease)   . Headache     PAST SURGICAL HISTORY: Past Surgical History  Procedure Laterality Date  . Abdominal aortic aneurysm repair  2000  . Coronary stent placement  2000  . Tonsillectomy      when he was a child    FAMILY HISTORY Family History  Problem Relation Age of Onset  . Cancer Mother   . Cancer Sister   . Cancer Brother   . Cancer Maternal Aunt       ADVANCED DIRECTIVES:  Patient does have advance healthcare directive, Patient   does not desire to make any changes  HEALTH MAINTENANCE: Social History  Substance Use Topics  . Smoking status: Former Smoker    Types: Cigarettes, Cigars    Quit date: 04/29/2003  . Smokeless tobacco: None  . Alcohol Use: No    Colonoscopy: PAP: Bone density: Lipid panel:  Allergies  Allergen Reactions  . Penicillin G Rash  . Sulfa Antibiotics Rash    Current Outpatient Prescriptions  Medication Sig Dispense Refill  . ADVAIR DISKUS 250-50 MCG/DOSE AEPB INHALE 1 PUFF TWICE A DAY 60 each 0  . benzonatate (TESSALON) 200 MG capsule Take 200 mg by mouth 3 (three) times daily as needed for cough.    . chlorpheniramine-HYDROcodone (TUSSIONEX PENNKINETIC ER) 10-8 MG/5ML SUER Take 5 mLs by mouth every 12 (twelve) hours as needed for cough. 240 mL 0  . citalopram (CELEXA) 20 MG tablet TAKE 1 TABLET (20 MG TOTAL) BY MOUTH DAILY. 30 tablet 3  . fluticasone (FLONASE) 50 MCG/ACT nasal spray 1 spray. 1 spray in each nostril BID    . furosemide (LASIX) 20 MG tablet Take 1 tablet (20 mg total) by mouth every other day. 30 tablet   . HUMALOG KWIKPEN 100 UNIT/ML KiwkPen CHECK BLOOD SUGAR BEFORE EACH MEAL. INJECT UNDER THE SKIN PER  SLIDING SCALE. MAX 30 U PER DAY  2  . ipratropium-albuterol (DUONEB) 0.5-2.5 (3) MG/3ML SOLN Take 3 mLs by nebulization every 8 (eight) hours as needed.    [name] [name] LANTUS SOLOSTAR 100 UNIT/ML Solostar Pen 22 Units. 22 units at bedtime    . levofloxacin (LEVAQUIN) 500 MG tablet Take 1 tablet (500 mg total) by mouth daily. 5 tablet 0  . metoprolol succinate (TOPROL-XL) 100 MG 24 hr tablet Take 100 mg by mouth daily.    [name] [name] omeprazole (PRILOSEC) 40 MG capsule TAKE 1 CAPSULE (40 MG TOTAL) BY MOUTH DAILY. 30 capsule 0  . ondansetron (ZOFRAN) 4 MG tablet Take 1 tablet (4 mg total) by mouth every 6 (six) hours as needed for nausea or vomiting. 30 tablet 3  . predniSONE (DELTASONE) 10 MG tablet Start '50mg'$  tapers by '10mg'$  daily to 0. 15 tablet 0  . predniSONE (DELTASONE) 10 MG tablet 1 TAB(S) ORALLY ONCE A DAY 30 tablet 3  . PROAIR HFA 108 (90 BASE) MCG/ACT inhaler INHALE 2 PUFFS 4 TIMES A DAY AS NEEDED 8.5 Inhaler 0  . SPIRIVA HANDIHALER 18 MCG inhalation capsule INHALE 1 CAPSULE WITH HANDIHALER ONCE A DAY 30 capsule 5  . tamsulosin (FLOMAX) 0.4 MG CAPS capsule TAKE ONE CAPSULE BY MOUTH EVERY DAY 30 capsule 7  . tobramycin-dexamethasone (TOBRADEX) ophthalmic ointment Place 1 application into both eyes 2 (two) times daily. 3.5 g 0  . Triamcinolone Acetonide (TRIAMCINOLONE 0.1 % CREAM : EUCERIN) CREA Apply 1 application  topically daily. 1 each 0   No current facility-administered medications for this visit.   Facility-Administered Medications Ordered in Other Visits  Medication Dose Route Frequency Provider Last Rate Last Dose  . 0.9 % sodium chloride infusion 250 mL Intravenous Once Evlyn Kanner, NP    . sodium chloride 0.9 % injection 10 mL 10 mL Intracatheter PRN Forest Gleason, MD  10 mL at 09/26/14 1405  . sodium chloride 0.9 % injection 10 mL 10 mL Intracatheter PRN Forest Gleason, MD  10 mL at  02/08/15 1419    OBJECTIVE: Vital signs have been reviewed. Patient is in wheelchair. Lymphatic system: Supraclavicular, cervical, axillary, inguinal lymph nodes are not palpable Tachycardia.  Soft systolic murmur.  Distant heart sounds. Lungs: Emphysematous chest bilateral rhonchi Abdomen: Distention.  No ascites. Lower extremity swelling present.  Has improved significantly Neurological system: Patient is somewhat depressed. HEENT: No evidence of stomatitis No other localizing signs Skin: Ecchymosis improved Musculoskeletal system no tenderness no fracture All other systems have been examined    LAB RESULTS:                                                                            STUDIES: All lab data has been reviewed. CBC Latest Ref Rng 06/19/2015 06/05/2015 05/30/2015  WBC 3.8 - 10.6 K/uL 14.3(H) 5.3 2.1(L)  Hemoglobin 13.0 - 18.0 g/dL 11.3(L) 9.5(L) 6.9(L)  Hematocrit 40.0 - 52.0 % 34.8(L) 28.5(L) 21.0(L)  Platelets 150 - 440 K/uL 136(L) 51(L) 40(L)     ASSESSMENT:  Squamous cell carcinoma of the lung, stage IV disease. Progressive disease with cumulative side effect of chemotherapy resulting in 2 declining performance status and poor quality of life.  2.  Patient is off chemotherapy 3.  Swelling of lower extremity has responded to [facility] but resulting in 2 increase in serum creatinine and BUN so patient was advised to stop Demadex and use it on when necessary basis for swelling in lower extremity  4.  Blood sugars are out of control and patient was advised to go back on his normal evening insulin.  And continue sliding-scale Patient is not a candidate for any further chemotherapy at present time.  Not a candidate for any clinical trial.  We discussed possibility of farm palliative and hospice care at present time family is going to think about all these options.  Total duration of visit was 30 minutes.  50% or more time was spent in counseling patient and  family regarding prognosis and options of treatment and available resources   Hemoglobin is low but transfuse 1 unit of packed cells A platelet count is low tomorrow less than 20 then another unit of platelet could be transfused

## 2015-06-20 ENCOUNTER — Other Ambulatory Visit: Payer: Self-pay | Admitting: Oncology

## 2015-06-22 ENCOUNTER — Telehealth: Payer: Self-pay | Admitting: *Deleted

## 2015-06-22 NOTE — Telephone Encounter (Addendum)
Called to let us know that [name] [name] passed away last night. Would like to speak with Dr Oliva Bustard to let him know how much they appreciate everything he did for them.

## 2015-06-27 DEATH — deceased

## 2015-07-03 ENCOUNTER — Ambulatory Visit: Payer: Medicare Other | Admitting: Oncology

## 2015-07-03 ENCOUNTER — Other Ambulatory Visit: Payer: Medicare Other

## 2015-07-03 ENCOUNTER — Ambulatory Visit: Payer: Medicare Other

## 2015-10-15 ENCOUNTER — Other Ambulatory Visit: Payer: Self-pay | Admitting: Nurse Practitioner

## 2017-07-07 IMAGING — CR DG CHEST 2V
1 series · 2 of 2 positions shown · non-contrast
Comparison: 11/07/2014

CLINICAL DATA: Lung cancer follow-up, chronic shortness-of-breath.

EXAM:
CHEST  2 VIEW

[Series 1: dg chest 2 view · 0.14mm/px · 2 of 2 slices shown]
[im 1/2]
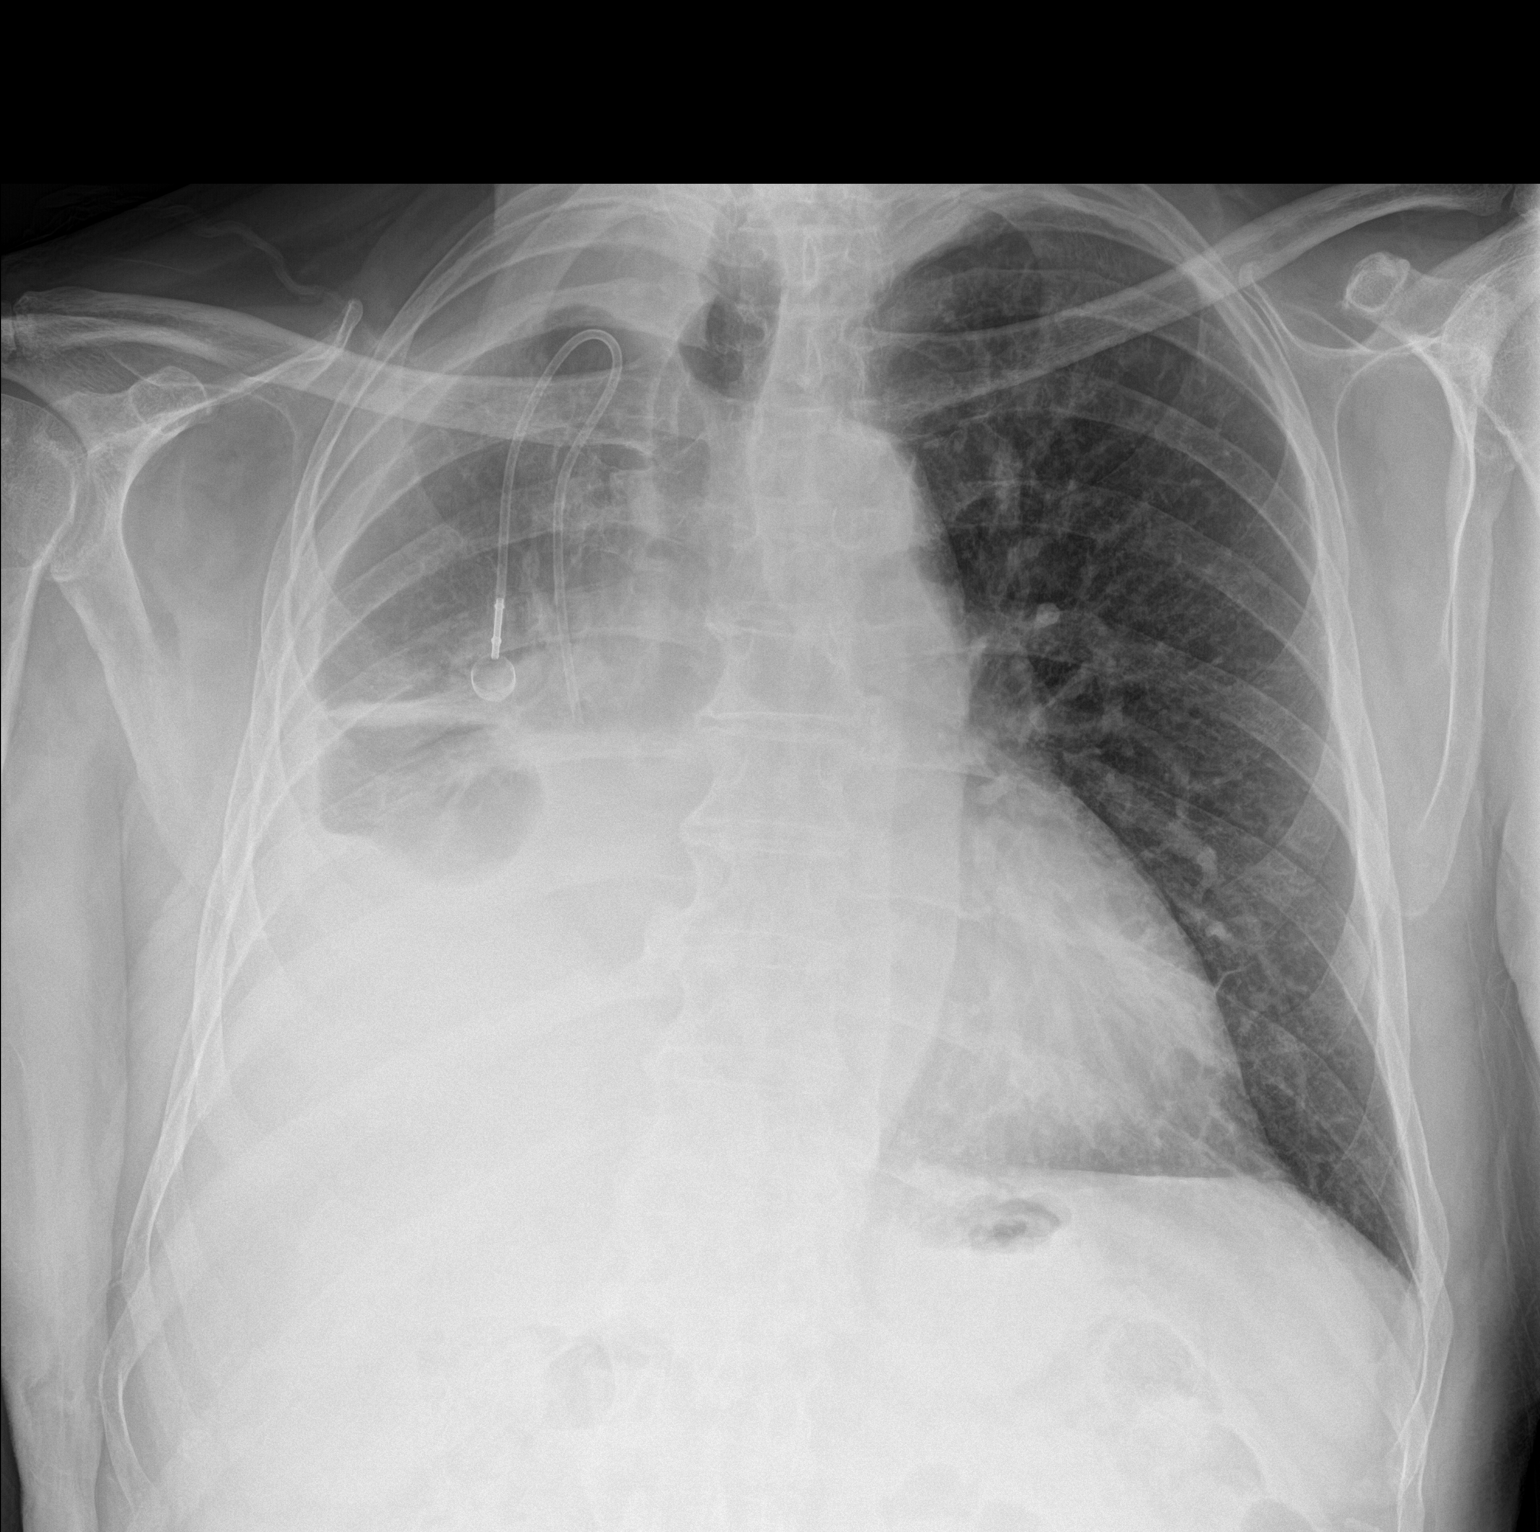
[im 2/2]
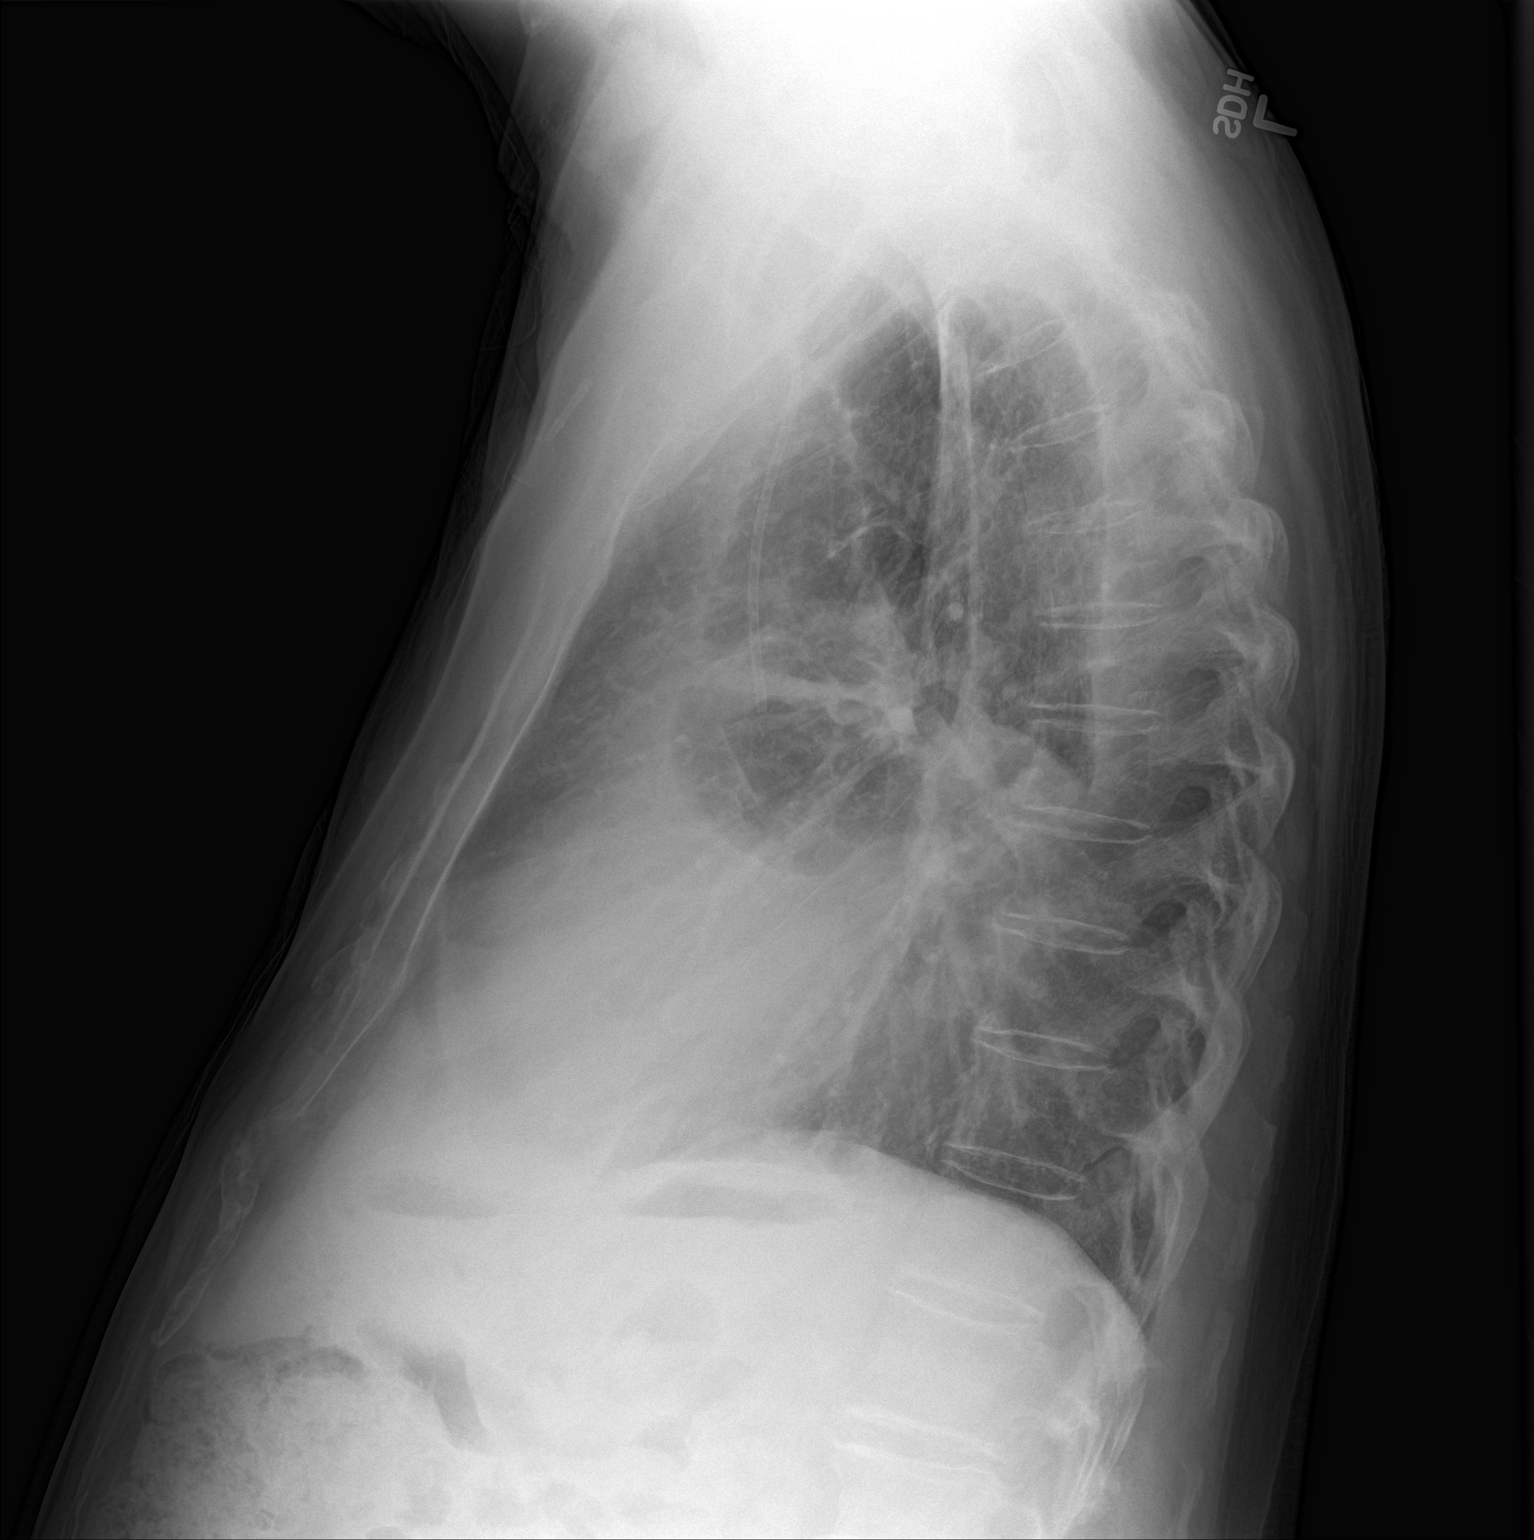

[2 of 2 positions shown; findings below may reference images not displayed]

FINDINGS: Right IJ Port-A-Cath unchanged. Postsurgical volume loss right lung
with slight worsening moderate to large right effusion. Left lung is
clear. Remainder the exam is unchanged.
IMPRESSION: Postsurgical volume loss of the right lung with slight worsening
moderate to large right effusion.

## 2017-07-29 IMAGING — CT NM PET TUM IMG RESTAG (PS) SKULL BASE T - THIGH
10 of 11 series · 15 of 25 positions shown · non-contrast
Comparison: PET-CT 10/25/2014.

CLINICAL DATA: Subsequent treatment strategy for lung cancer.
Restaging examination.

EXAM:
NUCLEAR MEDICINE PET SKULL BASE TO THIGH
TECHNIQUE: 12.2 mCi F-18 FDG was injected intravenously. Full-ring PET imaging
was performed from the skull base to thigh after the radiotracer. CT
data was obtained and used for attenuation correction and anatomic
localization.
FASTING BLOOD GLUCOSE:  Value: 93 mg/dl

[Series 3: ct wb 5.0 b30f · axial · 5.0mm · 0.98mm/px · z∈[-1446,-462]mm · 2 of 329 slices shown]
[im 1/329]
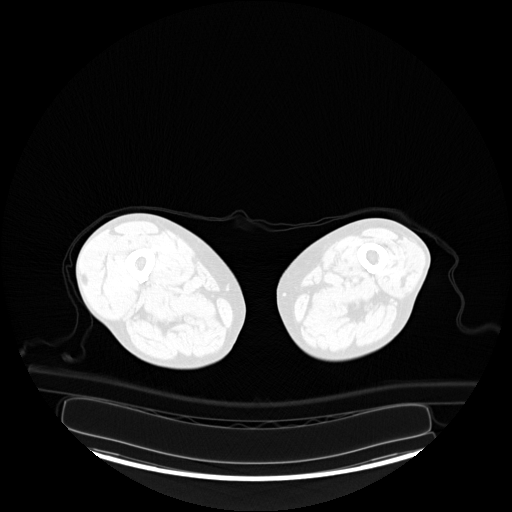
[im 329/329  brain]
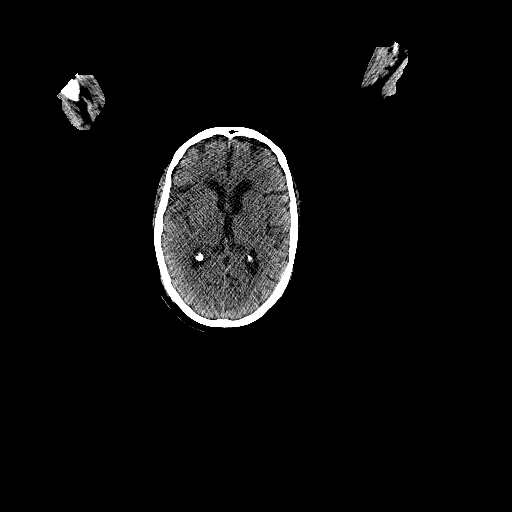

[Series 4: pet wb (ac) · axial · 5.0mm · 4.07mm/px · z∈[-954,-462]mm · 2 of 329 slices shown]
[im 165/329]
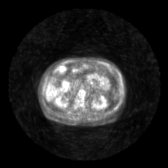
[im 329/329]
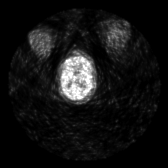

[Series 5: pet wb uncorrected (nac) · axial · 5.0mm · 4.07mm/px · z∈[-1119,-462]mm · 2 of 329 slices shown]
[im 110/329]
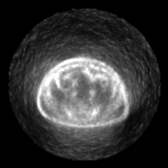
[im 329/329]
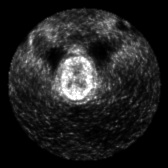

[Series 603: pet/ct fused axial · 2 of 327 slices shown]
[im 1/327]
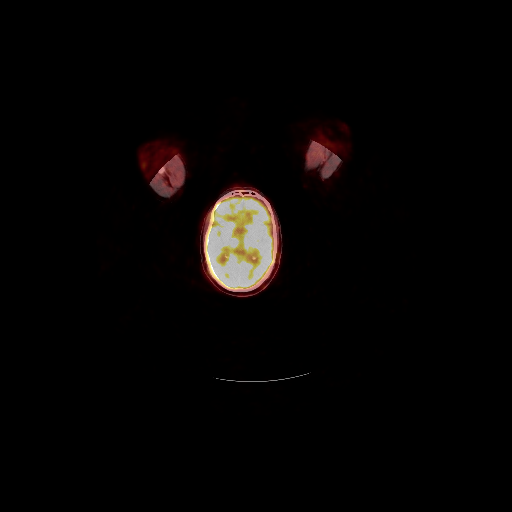
[im 218/327]
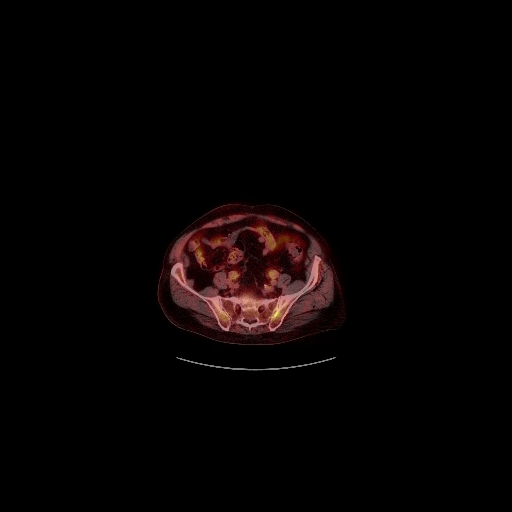

[Series 604: pet/ct fused coronal · 1 of 74 slices shown]
[im 1/74]
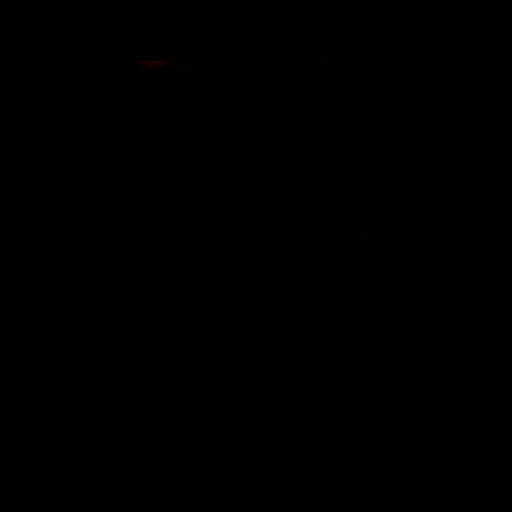

[Series 605: pet/ct fused sagittal · 1 of 120 slices shown]
[im 1/120]
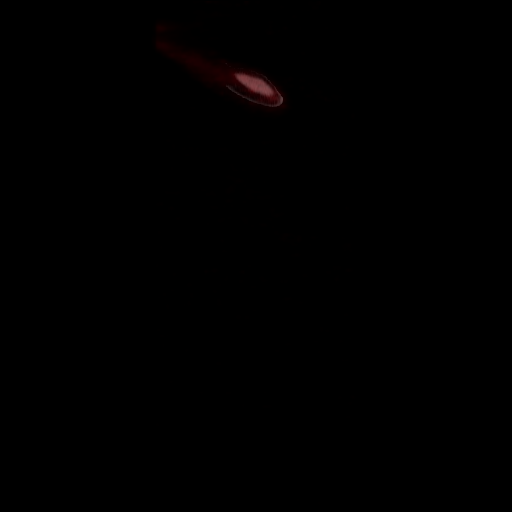

[Series 606: pet axial · 2 of 327 slices shown]
[im 109/327]
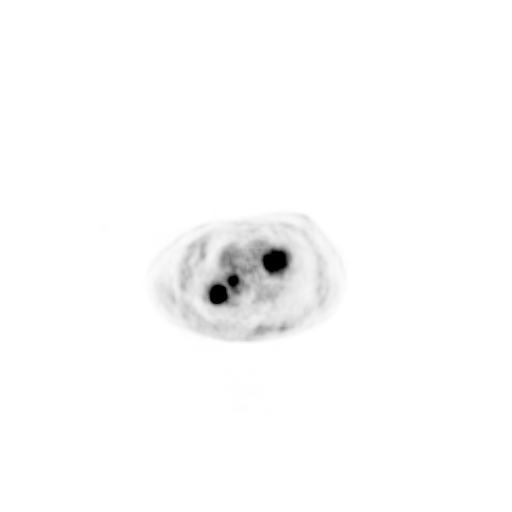
[im 327/327]
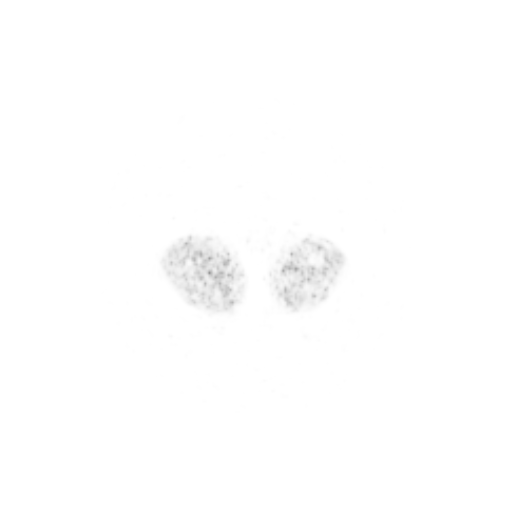

[Series 607: pet coronal · 1 of 112 slices shown]
[im 1/112]
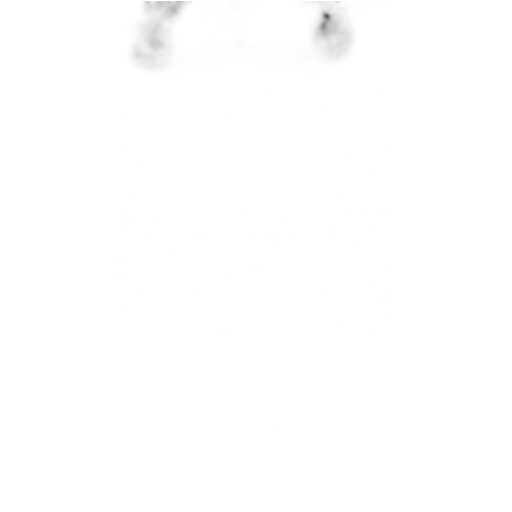

[Series 608: pet sagittal · 1 of 140 slices shown]
[im 140/140]
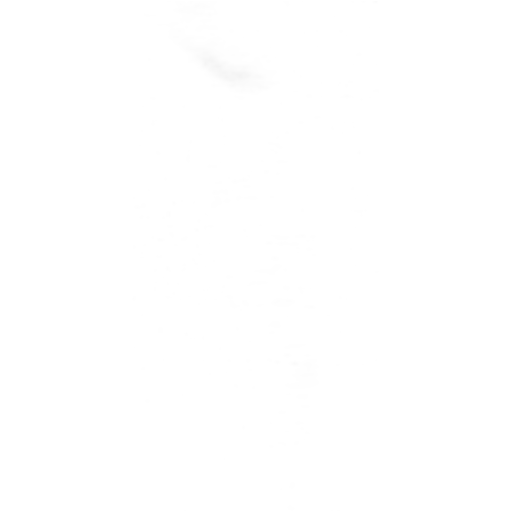

[results mm oncology reading · 0.49mm/px · 1 of 3 slices shown]
[im 1/3]
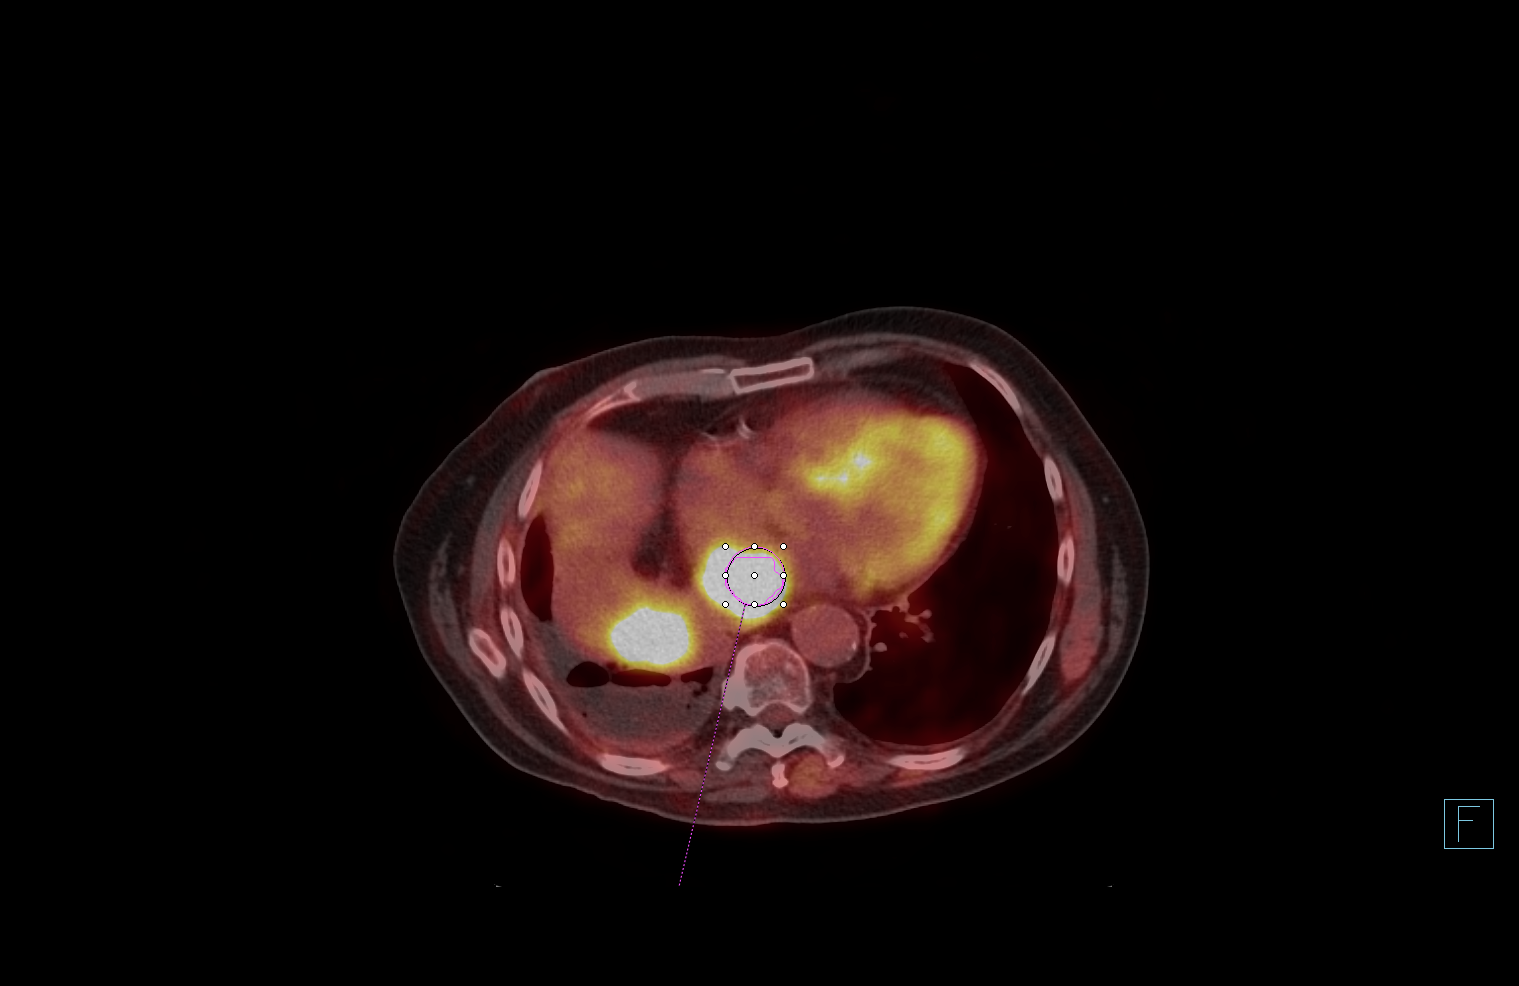

[15 of 25 positions shown; findings below may reference images not displayed]

FINDINGS: NECK

No hypermetabolic lymph nodes in the neck.

CHEST

Interval enlargement of the previously noted hypervascular mass
centered in the medial aspect of the right lower lobe, which is
currently a dumbbell-shaped lesion measuring 4.5 x 7.9 cm (image 117
of series 3). The lateral aspect of this lesion remains within the
right lower lobe, while the medial aspect has clearly invaded into
the left atrium likely a along the course of the right inferior
pulmonary vein. This mass encases the bronchus intermedius which is
completely obstructed by the mass. There is extensive soft tissue
thickening around the right upper lobe bronchus as well, however,
this area demonstrates only low-level hypermetabolism. No other
mediastinal or left hilar hypermetabolic lymphadenopathy is noted.
The right lower lobe appears completely chronically collapsed, as
does the right middle lobe. There is extensive septal thickening
throughout the right upper lobe, which is likely post treatment
related. Small 6 mm pleural-based nodule in the apex of the right
upper lobe (image 74 of series 3) demonstrates no hypermetabolism
and is similar in retrospect compared to the prior study. Complex
pleural fluid and gas collection which appears have multiple
internal loculations and multiple air-fluid levels. There is only
low-level metabolic activity associated with the right pleural
space, suggesting against active infection (i.e., this does not
appear to represent an empyema). Heart size is mildly enlarged.
Atherosclerotic calcifications in the left anterior descending, left
circumflex and right coronary arteries. Several tiny pulmonary
nodules in the left lung appear similar to the prior examination and
are highly nonspecific measuring up to 4 mm in the left lower lobe
(image 91 of series 3). Right internal jugular single-lumen porta
cath with tip terminating at the superior cavoatrial junction.

ABDOMEN/PELVIS

There is at least 1 tiny focus of hypermetabolism (SUVmax = 5.6) in
the right lobe of the liver (segment 4A), where there is a very
ill-defined area of low attenuation (image 132 of series 3), highly
suspicious for a metastatic lesion. No abnormal hypermetabolic
activity within the pancreas, adrenal glands, or spleen. No
hypermetabolic lymph nodes in the abdomen or pelvis. Multiple
well-defined low-attenuation lesions in the kidneys bilaterally
demonstrate no internal hypermetabolism, and although they are not
definitively characterized on today's noncontrast CT examination,
these are likely cysts, largest of which measures 2.9 cm in the
upper pole of the right kidney. Atherosclerosis throughout the
abdominal and pelvic vasculature, with fusiform infrarenal abdominal
aortic aneurysm measuring 3.6 x 3.4 cm (unchanged) and postoperative
changes of aorto bi-iliac bypass graft placement. Normal appendix.
Numerous colonic diverticuli are noted, without surrounding
inflammatory changes to suggest an acute diverticulitis at this
time. No significant volume of ascites. No pneumoperitoneum.

SKELETON

Numerous foci of skeletal hypermetabolism, which are most commonly
seen in the pelvis. Many of these are occult on CT images, but some
are slightly lidocaine appearance, including an 8 mm lesion in the
central aspect of the sacrum (image 223 of series 3) which is
hypermetabolic (SUVmax = 5.8).
IMPRESSION: 1. Today's study demonstrates progression of disease as evidenced by
interval enlargement of the right lower lobe mass which is clearly
invading the left atrium of the heart via the right inferior
pulmonary vein. There is some low-level metabolic activity
throughout the right pleural space, which may indicate a malignant
effusion, or may simply be a related to the patient's complex
right-sided hydropneumothorax and associated inflammation) no overt
findings to strongly suggest empyema at this time, however, sampling
of the pleural fluid is suggested).
2. In addition, there is a hepatic metastasis in segment 4A of the
liver, and numerous osseous lesions, as discussed above.
3. Additional incidental findings, as above.
